# Patient Record
Sex: Male | Born: 1950 | Race: White | Hispanic: No | Marital: Married | Smoking: Current every day smoker
Health system: Southern US, Community
[De-identification: ages and names within clinical notes are randomized; demographics above are authoritative.]

## PROBLEM LIST (undated history)

## (undated) DIAGNOSIS — I509 Heart failure, unspecified: Secondary | ICD-10-CM

## (undated) DIAGNOSIS — R233 Spontaneous ecchymoses: Secondary | ICD-10-CM

## (undated) DIAGNOSIS — I739 Peripheral vascular disease, unspecified: Secondary | ICD-10-CM

## (undated) DIAGNOSIS — M549 Dorsalgia, unspecified: Secondary | ICD-10-CM

## (undated) DIAGNOSIS — I10 Essential (primary) hypertension: Secondary | ICD-10-CM

## (undated) DIAGNOSIS — R6883 Chills (without fever): Secondary | ICD-10-CM

## (undated) DIAGNOSIS — M1990 Unspecified osteoarthritis, unspecified site: Secondary | ICD-10-CM

## (undated) DIAGNOSIS — I2510 Atherosclerotic heart disease of native coronary artery without angina pectoris: Secondary | ICD-10-CM

## (undated) DIAGNOSIS — R238 Other skin changes: Secondary | ICD-10-CM

## (undated) DIAGNOSIS — F419 Anxiety disorder, unspecified: Secondary | ICD-10-CM

## (undated) HISTORY — DX: Chills (without fever): R68.83

## (undated) HISTORY — PX: FRACTURE SURGERY: SHX138

## (undated) HISTORY — PX: KNEE ARTHROSCOPY: SHX127

## (undated) HISTORY — DX: Other skin changes: R23.8

## (undated) HISTORY — DX: Spontaneous ecchymoses: R23.3

## (undated) HISTORY — PX: ORTHOPEDIC SURGERY: SHX850

## (undated) HISTORY — PX: OTHER SURGICAL HISTORY: SHX169

## (undated) HISTORY — DX: Unspecified osteoarthritis, unspecified site: M19.90

## (undated) SURGERY — REPAIR, HERNIA, INGUINAL, ADULT
Anesthesia: General | Laterality: Right

---

## 2000-03-03 ENCOUNTER — Emergency Department (HOSPITAL_COMMUNITY): Admission: EM | Admit: 2000-03-03 | Discharge: 2000-03-03 | Payer: Self-pay | Admitting: Emergency Medicine

## 2000-08-17 ENCOUNTER — Emergency Department (HOSPITAL_COMMUNITY): Admission: EM | Admit: 2000-08-17 | Discharge: 2000-08-17 | Payer: Self-pay | Admitting: *Deleted

## 2000-11-30 ENCOUNTER — Emergency Department (HOSPITAL_COMMUNITY): Admission: EM | Admit: 2000-11-30 | Discharge: 2000-11-30 | Payer: Self-pay | Admitting: Emergency Medicine

## 2001-12-06 ENCOUNTER — Encounter: Payer: Self-pay | Admitting: Emergency Medicine

## 2001-12-06 ENCOUNTER — Emergency Department (HOSPITAL_COMMUNITY): Admission: EM | Admit: 2001-12-06 | Discharge: 2001-12-06 | Payer: Self-pay | Admitting: Emergency Medicine

## 2002-01-24 ENCOUNTER — Emergency Department (HOSPITAL_COMMUNITY): Admission: EM | Admit: 2002-01-24 | Discharge: 2002-01-24 | Payer: Self-pay | Admitting: Emergency Medicine

## 2002-06-29 ENCOUNTER — Ambulatory Visit (HOSPITAL_COMMUNITY): Admission: RE | Admit: 2002-06-29 | Discharge: 2002-06-30 | Payer: Self-pay | Admitting: Cardiovascular Disease

## 2002-06-29 ENCOUNTER — Encounter: Payer: Self-pay | Admitting: Cardiovascular Disease

## 2002-07-16 ENCOUNTER — Encounter: Payer: Self-pay | Admitting: *Deleted

## 2002-07-16 ENCOUNTER — Ambulatory Visit (HOSPITAL_COMMUNITY): Admission: RE | Admit: 2002-07-16 | Discharge: 2002-07-16 | Payer: Self-pay | Admitting: *Deleted

## 2002-09-09 HISTORY — PX: CARDIAC CATHETERIZATION: SHX172

## 2002-09-21 ENCOUNTER — Ambulatory Visit (HOSPITAL_BASED_OUTPATIENT_CLINIC_OR_DEPARTMENT_OTHER): Admission: RE | Admit: 2002-09-21 | Discharge: 2002-09-21 | Payer: Self-pay | Admitting: Orthopedic Surgery

## 2002-10-12 ENCOUNTER — Ambulatory Visit (HOSPITAL_COMMUNITY): Admission: RE | Admit: 2002-10-12 | Discharge: 2002-10-12 | Payer: Self-pay | Admitting: Family Medicine

## 2002-10-12 ENCOUNTER — Encounter: Payer: Self-pay | Admitting: Family Medicine

## 2003-02-27 ENCOUNTER — Emergency Department (HOSPITAL_COMMUNITY): Admission: EM | Admit: 2003-02-27 | Discharge: 2003-02-27 | Payer: Self-pay | Admitting: Emergency Medicine

## 2003-05-18 ENCOUNTER — Inpatient Hospital Stay (HOSPITAL_COMMUNITY): Admission: EM | Admit: 2003-05-18 | Discharge: 2003-05-20 | Payer: Self-pay | Admitting: Emergency Medicine

## 2003-06-21 ENCOUNTER — Ambulatory Visit (HOSPITAL_COMMUNITY): Admission: RE | Admit: 2003-06-21 | Discharge: 2003-06-22 | Payer: Self-pay | Admitting: Cardiovascular Disease

## 2003-09-08 ENCOUNTER — Inpatient Hospital Stay (HOSPITAL_COMMUNITY): Admission: EM | Admit: 2003-09-08 | Discharge: 2003-09-11 | Payer: Self-pay | Admitting: Emergency Medicine

## 2003-10-25 ENCOUNTER — Ambulatory Visit (HOSPITAL_COMMUNITY): Admission: RE | Admit: 2003-10-25 | Discharge: 2003-10-25 | Payer: Self-pay | Admitting: Orthopedic Surgery

## 2003-10-25 ENCOUNTER — Ambulatory Visit (HOSPITAL_BASED_OUTPATIENT_CLINIC_OR_DEPARTMENT_OTHER): Admission: RE | Admit: 2003-10-25 | Discharge: 2003-10-25 | Payer: Self-pay | Admitting: Orthopedic Surgery

## 2004-04-21 ENCOUNTER — Emergency Department (HOSPITAL_COMMUNITY): Admission: EM | Admit: 2004-04-21 | Discharge: 2004-04-21 | Payer: Self-pay | Admitting: *Deleted

## 2004-09-03 ENCOUNTER — Emergency Department (HOSPITAL_COMMUNITY): Admission: EM | Admit: 2004-09-03 | Discharge: 2004-09-03 | Payer: Self-pay | Admitting: Family Medicine

## 2004-09-06 ENCOUNTER — Emergency Department (HOSPITAL_COMMUNITY): Admission: EM | Admit: 2004-09-06 | Discharge: 2004-09-06 | Payer: Self-pay | Admitting: Emergency Medicine

## 2004-09-20 ENCOUNTER — Ambulatory Visit (HOSPITAL_COMMUNITY): Admission: RE | Admit: 2004-09-20 | Discharge: 2004-09-20 | Payer: Self-pay | Admitting: Cardiovascular Disease

## 2005-03-18 ENCOUNTER — Emergency Department (HOSPITAL_COMMUNITY): Admission: EM | Admit: 2005-03-18 | Discharge: 2005-03-18 | Payer: Self-pay | Admitting: Emergency Medicine

## 2005-05-30 ENCOUNTER — Emergency Department (HOSPITAL_COMMUNITY): Admission: EM | Admit: 2005-05-30 | Discharge: 2005-05-30 | Payer: Self-pay | Admitting: Emergency Medicine

## 2005-09-04 ENCOUNTER — Emergency Department (HOSPITAL_COMMUNITY): Admission: EM | Admit: 2005-09-04 | Discharge: 2005-09-04 | Payer: Self-pay | Admitting: Emergency Medicine

## 2006-04-20 ENCOUNTER — Emergency Department (HOSPITAL_COMMUNITY): Admission: EM | Admit: 2006-04-20 | Discharge: 2006-04-20 | Payer: Self-pay | Admitting: Family Medicine

## 2006-08-30 ENCOUNTER — Emergency Department (HOSPITAL_COMMUNITY): Admission: EM | Admit: 2006-08-30 | Discharge: 2006-08-30 | Payer: Self-pay | Admitting: Emergency Medicine

## 2007-09-18 ENCOUNTER — Emergency Department (HOSPITAL_COMMUNITY): Admission: EM | Admit: 2007-09-18 | Discharge: 2007-09-18 | Payer: Self-pay | Admitting: Family Medicine

## 2008-08-26 ENCOUNTER — Encounter: Admission: RE | Admit: 2008-08-26 | Discharge: 2008-08-26 | Payer: Self-pay | Admitting: Sports Medicine

## 2008-09-21 ENCOUNTER — Encounter: Admission: RE | Admit: 2008-09-21 | Discharge: 2008-09-21 | Payer: Self-pay | Admitting: Sports Medicine

## 2011-01-25 NOTE — Op Note (Signed)
NAME:  Cole Fuller, Cole Fuller                         ACCOUNT NO.:  [id number]   MEDICAL RECORD NO.:  [id number]                   PATIENT TYPE:  AMB   LOCATION:  DSC                                  FACILITY:  [facility]   PHYSICIAN:  Robert A. Thurston Hole, M.D.              DATE OF BIRTH:  1950/12/01   DATE OF PROCEDURE:  09/21/2002  DATE OF DISCHARGE:                                 OPERATIVE REPORT   PREOPERATIVE DIAGNOSIS:  Right knee medial meniscus tear.   POSTOPERATIVE DIAGNOSIS:  Right knee medial meniscus tear with synovitis.   OPERATION:  1. Right knee examination under anesthesia followed by arthroscopic partial     medial meniscectomy.  2. Right knee partial synovectomy.   SURGEON:  Elana Alm. Thurston Hole, M.D.   ASSISTANT:  Julien Girt, P.A.   ANESTHESIA:  Local with MAC.   OPERATIVE TIME:  30 minutes.   COMPLICATIONS:  None.   INDICATIONS FOR PROCEDURE:  The patient is a 60 year old gentleman who has  had significant right knee pain for the past five to six months, increasing  in nature with signs and symptoms and MRI documenting a medial meniscus tear  who has failed conservative care and is now to undergo arthroscopy.   DESCRIPTION OF PROCEDURE:  The patient was brought to the operating room on  09/21/2002 after a knee block had been placed in the holding area.  He was  placed on the operating room table in a supine position.  His right knee was  examined under anesthesia.  He had range of motion from 0 to 130 degrees, 1  to 2+ crepitation, stable ligamentous exam with normal patellar tracking.  The right leg was prepped using sterile Duraprep and draped using sterile  technique.  Originally through an inferolateral portal, arthroscope with a  pump attached was placed.  Through an inferomedial portal, an arthroscopic  probe was placed.  On initial inspection of the medical compartment, the  articular cartilage was normal.  Medial meniscus was probed, and he had a  tear  of the posterior and medial horn of which 40 to 50% was resected back  to a stable rim.  ACL/PCL was normal.  Lateral compartment articular  cartilage was normal, lateral meniscus normal.  Patellofemoral joint and  articular cartilage were normal.  The patella tracked normally.  Moderate  synovitis in medial and lateral gutters were debrided.  Otherwise, this was  free of pathology.  After this was done, it was felt that all pathology had  been satisfactorily addressed.  The instruments were removed.  Portal was  closed with 3-0 nylon suture and injected with 0.25% Marcaine with  epinephrine and 4 mg of morphine.  Sterile dressing was applied.  The  patient was awakened and taken to the recovery room in stable condition.   FOLLOWUP CARE:  The patient will be followed as an outpatient on Percocet  and Naprosyn and  see me back in my office in a week for sutures out and  followup.                                                Robert A. Thurston Hole, M.D.    RAW/MEDQ  D:  09/21/2002  T:  09/21/2002  Job:  [id number]

## 2011-01-25 NOTE — Discharge Summary (Signed)
NAME:  Cole Fuller, Cole Fuller                         ACCOUNT NO.:  [id number]   MEDICAL RECORD NO.:  [id number]                   PATIENT TYPE:  OIB   LOCATION:  2034                                 FACILITY:  [facility]   PHYSICIAN:  Nanetta Batty, M.D.                DATE OF BIRTH:  01-06-1951   DATE OF ADMISSION:  06/21/2003  DATE OF DISCHARGE:  06/22/2003                                 DISCHARGE SUMMARY   DISCHARGE DIAGNOSES:  1. Claudication, right superficial femoral artery stenting this admission.  2. Known coronary artery disease with circumflex intervention, May 19, 2003, and known total right coronary artery with normal left ventricular     function.  3. Treated hypertension.  4. Treated hyperlipidemia.  5. History of smoking.  6. Macrocytic indices of 105 of unclear etiology.   HOSPITAL COURSE:  The patient is a 60 year old male known to Dr. Allyson Fuller who  had coronary artery disease and intervention on the circumflex on May 19, 2003. He is followed by Dr. Jenne Fuller.  His original circumflex stents were  placed in October 2003. He was seen by Dr. Allyson Fuller for evaluation of  claudication.  Lower extremity dopplers were obtained as an outpatient which  indicated severe insufficiency on the right with an ABI of 0.55.  He was  admitted for peripheral angiogram which was done June 21, 2003, by  Dr. Allyson Fuller. This revealed 50% left SFA with three vessel runoff and 99% right  SFA.  He underwent stenting to the right SFA with good final results.  Post  procedure he did have some right thigh pain, although this improved by the  next day. He has ambulated without problems.  On his preadmission labs he  had an MCV of 105.1 and a white count of 11.5. This was repeated. His white  count was 13.2, and hemoglobin 13.1 at discharge.  His renal function was  normal with a sodium of 142, potassium 3.6, BUN 6, creatinine 0.8. B12 and  folate levels were obtained. His B-12 was normal and  folate pending as well  as a differential. He will follow this up with Dr. Sherwood Fuller.   DISCHARGE MEDICATIONS:  1. Aspirin 81 mg.  2. Xanax as taken at home.  3. Lexapro 5 mg a day.  4. Plavix 75 mg a day.  5. Zocor 40 mg a day.  6. Lopressor 25 mg b.i.d.  7. Altace 2.5 mg a day.   He has been instructed to take folic acid and B12 as an outpatient.   LABORATORY DATA:  As noted above.    DISCHARGE INSTRUCTIONS:  The patient is discharged in stable condition and  will have lower extremity arterial dopplers as an outpatient.  He has been  instructed to contact Dr. Sherwood Fuller for further evaluation of his elevated white  count and MCV.      Abelino Derrick, P.A.  Nanetta Batty, M.D.    [name]  D:  06/22/2003  T:  06/22/2003  Job:  [id number]   cc:   Nanetta Batty, M.D.  [address], [address]  [place]  [place] [address]  Fax: [phone]   Cole Fuller, M.D.  [address]  [place]  [place] [address]  Fax: [phone]
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
## 2011-01-25 NOTE — Cardiovascular Report (Signed)
NAME:  Cole Fuller, Cole Fuller NO.:  [id number]   MEDICAL RECORD NO.:  [id number]                   PATIENT TYPE:  OIB   LOCATION:  2899                                 FACILITY:  [facility]   PHYSICIAN:  Nanetta Batty, M.D.                DATE OF BIRTH:  1951/03/26   DATE OF PROCEDURE:  06/21/2003  DATE OF DISCHARGE:                              CARDIAC CATHETERIZATION   INDICATIONS:  Cole Fuller is a 60 year old white male with history of CAD  status post circumflex PCI for in-stent restenosis May 19, 2003.  He  has had claudication with abnormal Dopplers.  His right ABI was 0.5 and his  left was 1.  He presents now for angiography, potential intervention.   PROCEDURE DESCRIPTION:  The patient was brought to the sixth floor [facility] in the postabsorptive state.  He  was premedicated with p.o. Valium and IV Versed as well as Nubain.  His left  groin was prepped and shaved in the usual sterile fashion.  1% Xylocaine was  used for local anesthesia.  A 5-French sheath was inserted into the left  femoral artery using standard Seldinger technique.  5-French tennis racquet  catheter and IMA catheters were used for mid stream and distal abdominal  aortography as well as bifemoral runoff.  Contralateral access was obtained  with the MA catheter and a 0.035 Wholey wire.  Selective angiography was  obtained through a short 5-French end hole catheter of the SFA profunda  bifurcation as well as a digital subtraction peak holed shot of the  trifurcation at the knee.  ICD dye was used for the entirety of the case.  Retrograde aortic pressure was monitored during the case.   ANGIOGRAPHIC RESULTS:  1. Abdominal aorta:     a. Renal arteries:  Normal.     b. Infrarenal abdominal aorta:  Normal.  2. Left lower extremity:     a. 50% segmental mid left SFA with three vessel runoff.  3. Right lower extremity:     a. Long 99%  segmental proximal and mid right SFA stenosis with        reconstitution at Hunter's canal with three vessel runoff.   PROCEDURE DESCRIPTION:  The patient received a total of 4500 units of  heparin intravenously.  Contralateral access was obtained with a 7-French  Terumo crossover sheath.  A 4 x 6 Powerflex was used for backup and a Wholey  was attempted to cross the lesion unsuccessfully.  Following this, an 0.035  angled Glidewire was used to cross the lesion into the distal SFA.  The  balloon was unable to cross the lesion, however, and because of this an  0.014 Choice PT wire was then used to cross the lesion into the distal SFA  and a 5 x 2 Aviator balloon was used to pre dilate the proximal segment of  the  SFA.  Following this, the 0.035 4 x 6 balloon was then advanced over the  0.014 Choice PT wire into the distal SFA and was exchanged for an 0.035  Wholey wire.  Overlapping inflations were performed in the mid and proximal  SFA with a 4 x 6 and a 5 x 10 Powerflex.  Following this overlapping Smart  stents (6 x 10, 6 x 10, 6 x 8) were then deployed in overlapping fashion and  post dilated with a 6 x 8 Opti-balloon at 1-2 atmospheres.  200 mcg of  intraarterial nitroglycerin was then administered down the sheath.  The  final angiographic result was reduction of a long segmental mid and proximal  right SFA stenosis with 0% residual.  The ostium of the SFA was protected  and the patient retained three vessel runoff.   OVERALL IMPRESSION:  Successful right superficial femoral artery PTA and  stenting of a long segmental proximal mid subtotal lesion with overlapping 6  mm Smart stents resulting in excellent angiographic result with brisk flow  and preservation of distal runoff.   The patient tolerated the procedure well.  ACT was measured at 214.  The  sheaths will be pulled in the laboratory and pressure will be held on the  groin to achieve hemostasis.  The patient left the  laboratory in stable  condition.  Plans will be to proceed with __________ and Plavix.  He will  remain recumbent for six hours and will be hydrated overnight.  Will be  discharged home in the morning and will get follow-up Dopplers and ABIs.  Will see him back in the office in approximately two weeks.                                                Nanetta Batty, M.D.    JB/MEDQ  D:  06/21/2003  T:  06/21/2003  Job:  [id number]   cc:   [facility]   [facility]   Madelin Rear. Sherwood Gambler, M.D.  [address]  [place]  [place] [address]  Fax: [phone]

## 2011-01-25 NOTE — Discharge Summary (Signed)
NAME:  Cole Fuller, Cole Fuller                         ACCOUNT NO.:  [id number]   MEDICAL RECORD NO.:  [id number]                   PATIENT TYPE:  INP   LOCATION:  6526                                 FACILITY:  [facility]   PHYSICIAN:  Cristy Hilts. Jacinto Halim, M.D.                  DATE OF BIRTH:  12-Sep-1962   DATE OF ADMISSION:  05/18/2003  DATE OF DISCHARGE:  05/20/2003                                 DISCHARGE SUMMARY   DISCHARGE DIAGNOSES:  1. Unstable angina, negative myocardial infarction.  2. Coronary disease with cutting balloon percutaneous coronary intervention     of the proximal mid circumflex stent and ostium of the marginal branch.  3. Hypertension, controlled.  4. Hyperlipidemia.  5. Tobacco use.  6. Lower extremity claudication, chronic.  7. Depression.   DISCHARGE CONDITION:  Improved.   PROCEDURES:  Combined left heart catheterization on May 19, 2003 by Dr.  Nanetta Batty with cutting balloon angioplasty of in-stent restenosis of  the circumflex and ostium of the marginal branch using injunctive  glycoprotein IIb/IIIa inhibition.   DISCHARGE MEDICATIONS:  1. Plavix 75 mg one daily.  2. Aspirin enteric-coated 81 mg daily.  3. Zocor 40 mg every evening.  4. Pletal 50 mg twice a day.  5. Lexapro 5 mg daily which is one-half of a 10 mg tablet.  6. Lopressor 25 one-half of a 50 mg tablet twice a day.  7. Altace 2.5 daily.  8. Xanax 2 mg as before.  9. Oxycodone as before.  10.      Nitroglycerin 1/150 as needed under your tongue for chest pain.  11.      Nicoderm patch.   DISCHARGE INSTRUCTIONS:  1. Do not smoke with patch on - could cause a heart attack.  2. No strenuous activity, no sexual activity, no lifting over 10 pounds for     three days, then resume regular activities.  3. Low fat/low salt diet.  4. Wash right groin catheterization site with soap and water.  Call for any     bleeding, swelling, or drainage.  5. See Dr. Allyson Sabal for follow-up June 02, 2003 at  2:30 p.m.   HISTORY OF PRESENT ILLNESS:  A 60 year old white married male with history  of coronary disease, peripheral vascular disease, tobacco use, and  hyperlipidemia presented through the emergency room at [facility] with chest pain  and leg pain   The patient had been to Dr. Sharyon Medicus office the morning of admission -  May 18, 2003 - because his medication had run out and increasing lower  extremity pain.  He also had mentioned some chest pain while he was there.  The pain was described as left chest pain into left breast, dull pain, comes  and goes intermittently, and it increased with anxiety/panic per the  patient; no change with exertion.  The night before it felt like his  heartbeat was slow and  would then beat hard.  His previous angina was  somewhat difference than this in that it was pain in his throat and he was  diaphoretic.   He was admitted to [facility] by Dr. Jacinto Halim on call for Dr. Allyson Sabal with  plans for cardiac catheterization.  He was put on IV heparin and tobacco  cessation nurse was asked to see the patient.  Fortunately, the patient did  well overnight.  By May 19, 2003 he had no chest pain.  He did undergo  his cardiac catheterization with results as stated.   By May 20, 2003 he was stable and ready for discharge home.  He would  continue aspirin and Plavix for at least two weeks and follow up with Dr.  Allyson Sabal.   HOSPITAL COURSE:  See above.   PHYSICAL EXAMINATION AT DISCHARGE:  VITAL SIGNS:  Blood pressure 130/70,  pulse 80, respirations 18, temperature 97.1, room air oxygen saturation 96%.  GENERAL:  Alert, oriented male, no chest pain.  Right groin catheterization  site stable.  No hematoma.  CARDIOVASCULAR:  Heart sounds S1, S2, regular rate and rhythm.   LABORATORY DATA:  Hemoglobin 14.7, hematocrit 43.4, WBC 10.5, MCV 102,  platelets 253, neutrophils 54, lymphs 41, monos 4, eos 0, basos 1; these  remained stable.  Protime 12.7, INR 0.9, PTT  34, and heparin levels ranged  0.75 to 0.23 on heparin.   Chemistry:  Sodium 141, potassium 3.9, chloride 109, CO2 25, glucose 106,  BUN 10, creatinine 0.8, calcium 10.0.  Potassium did drop to 3.3 and it was  repleted.   Cardiac enzymes:  CKs 138, 122, 118, 89.  MBs 1.7, 1.6, 1.6, 1.1.  Troponin  I less than 0.01 x3.   Total cholesterol 169, triglycerides 174, HDL 39, LDL 95.   EKG:  Sinus rhythm, normal EKG.  No significant change from previous  tracings.  Chest x-ray done but results are not in the chart.    CARDIAC CATHETERIZATION:  See dictated report.   The patient was seen and discharged by Dr. Allyson Sabal on May 20, 2003 and  would follow up as an outpatient.      Darcella Gasman. [place], N.P.                     Cristy Hilts. Jacinto Halim, M.D.    LRI/MEDQ  D:  06/13/2003  T:  06/14/2003  Job:  [id number]   cc:   Nanetta Batty, M.D.  [address]  [place]  [place] [address]  Fax: [phone]   Madelin Rear. Sherwood Gambler, M.D.  [address]  [place]  [place] [address]  Fax: [phone]

## 2011-01-25 NOTE — Cardiovascular Report (Signed)
NAME:  Cole Fuller, Cole Fuller                         ACCOUNT NO.:  [id number]   MEDICAL RECORD NO.:  [id number]                   PATIENT TYPE:  INP   LOCATION:  6526                                 FACILITY:  [facility]   PHYSICIAN:  Nanetta Batty, M.D.                DATE OF BIRTH:  13-Jun-1951   DATE OF PROCEDURE:  DATE OF DISCHARGE:                              CARDIAC CATHETERIZATION   PROCEDURE:  Selective right and left cardiac catheterization, left  ventriculography, abdominal aortography and PCI and stenting.   INDICATIONS:  Cole Fuller is a 60 year old married white male with a history  of CAD status post circumflex PCI and stenting by Darlin Priestly, M.D.  June 29, 2002.  He had sequential stents placed in the proximal and mid  circumflex straddling a large first obtuse marginal branch.  He had a small  RCA which did give off a PDA and had 95% lesion, but was only a 1.5 mm  vessel.  Normal LV function.  He was admitted September 8 with unstable  angina, ruled out for myocardial infarction.  Placed on IV heparin and  nitroglycerin.  He presents now for diagnostic coronary arteriography.   PROCEDURE DESCRIPTION:  The patient was brought to the second floor [facility] in the postabsorptive state.  He was  premedicated with p.o. Valium.  His right groin was prepped and shaved in  the usual sterile fashion.  1% Xylocaine was used for local anesthesia.  A 6-  French sheath was inserted into the right femoral artery using standard  Seldinger technique.  6-French right and left Judkins diagnostic catheters  along with a 6-French pigtail catheter were used for selective coronary  angiography, left ventriculography, and distal abdominal aortography.  Omnipaque dye was used for the entirety of the case.  Retrograde aortic,  left ventricular, and pullback pressures were recorded.   HEMODYNAMICS:  1. Aortic systolic pressure 153, diastolic pressure  74.  2. Left ventricular systolic pressure 154 and diastolic pressure 16.   SELECTIVE CORONARY ANGIOGRAPHY:  1. Left main:  Normal.  2. LAD:  Normal.  3. Left circumflex:  This was a codominant vessel with stent in the proximal     and mid portion.  There was 90% in-stent restenosis within the proximal     stent and 50-70% within the mid stent.  The ostium of the marginal branch     had mild to moderate disease.  4. Right coronary artery:  This was a small vessel with 95% lesion that was     unchanged.   LEFT VENTRICULOGRAPHY:  RAO left ventriculogram was performed using 25 mL of  Omnipaque dye at 12 mL/second.  The overall LVEF was estimated greater than  60% without focal wall motion abnormalities.   DISTAL ABDOMINAL AORTOGRAPHY:  Performed using 20 mL of Omnipaque dye at 20  mL/second.  The renal arteries were widely  patent.  The infrarenal abdominal  aorta and iliac bifurcation appear free of significant atherosclerotic  changes.   IMPRESSION:  Cole Fuller has high grade in-stent restenosis within his tandem  circumflex stents.  Will proceed with PCI using cutting balloon angioplasty  and adjunctive Integrilin.   PROCEDURE DESCRIPTION:  An existing 6-French sheath in right femoral artery  was exchanged over a wire for a 7-French sheath.  6-French sheath was then  inserted into the right femoral vein.  Using a 7-French left 3.5 Voda guide  catheter along with an 0.014 190 Sport guidewire and a 3.0 x 10 mm cutting  balloon PCI was performed.  The patient received 3000 units of heparin with  an ACT at the end of the case at 234.  He was on aspirin and received Plavix  300 mg p.o. as well as Integrilin double bolus and infusion.  Angioplasty  was performed on the mid and proximal stent using the cutting balloon at 6-8  atmospheres.  This resulted in reduction of a 70% in-stent restenosis within  the mid stent to less than 20% and 90% in-stent restenosis within the  proximal stent  to 0%.  There was some snow plowing within the ostium of  the marginal branch seen on multiple angiographic views after administration  of intracoronary nitroglycerin.  This marginal branch was then wired with  the Sport guidewire and the 3.0 x 10 cutting balloon was then used to  angioplasty the ostium of the marginal branch resulting in reduction of 90%  ostial stenosis to 30% residual.  The patient tolerated procedure well  without electrocardiographic or hemodynamic sequelae.   OVERALL IMPRESSION:  Successful cutting balloon PCI of the proximal mid  circumflex stent and ostium of the marginal branch using adjunctive  glycoprotein 2B3A inhibition.  The guidewire and catheters were removed.  The sheaths were sewn securely in place.  The patient left laboratory in  stable condition.  Plan is to remove the sheaths once the ACT falls below  150.  Integrilin will be continued for 18 hours.  The patient will be  discharged home in the morning on aspirin and Plavix if he remains  clinically stable overnight.  He left the laboratory in stable condition.                                               Nanetta Batty, M.D.    JB/MEDQ  D:  05/19/2003  T:  05/20/2003  Job:  [id number]   cc:   Cath Lab   [facility]   Madelin Rear. Sherwood Gambler, M.D.  [address]  [place]  [place] [address]  Fax: [phone]

## 2011-01-25 NOTE — Cardiovascular Report (Signed)
NAME:  Cole Fuller, Cole Fuller                         ACCOUNT NO.:  [id number]   MEDICAL RECORD NO.:  [id number]                   PATIENT TYPE:  OIB   LOCATION:  2899                                 FACILITY:  [facility]   PHYSICIAN:  Darlin Priestly, M.D.             DATE OF BIRTH:  November 18, 1950   DATE OF PROCEDURE:  06/29/2002  DATE OF DISCHARGE:                              CARDIAC CATHETERIZATION   PROCEDURES:  1. Left heart catheterization.  2. Coronary angiography.  3. Left ventriculogram.  4. Left circumflex - mid.     a. Percutaneous transluminal coronary balloon angioplasty.     b. Placement of intracoronary stent x2.     c. Intravascular ultrasound imaging.   COMPLICATIONS:  None.   INDICATIONS:  The patient is a 60 year old male, patient of Dr. Artis Delay  with a history of chronic tobacco use, who recently has complained of  exertional chest pain migrating to his throat. He is now referred for  cardiac catheterization.   DESCRIPTION OF PROCEDURE:  After given informed written consent, the patient  was brought to the cardiac catheterization lab where his right and left  groins were shaved, prepped, and draped in the usual sterile fashion.  ECG  monitoring was established.  Using modified Seldinger technique, a #6 French  arterial sheath was inserted in the right femoral artery.  The 6 French  diagnostic catheters were used to perform diagnostic angiography.  This reveals a medium sized left main with no significant disease.   The LAD is a medium sized vessel which coursed to the apex and gave rise to  one diagonal branch.  There was diffuse calcification throughout the  proximal RCA.  There is a 40% proximal LAD disease with 30 mid LAD disease.  The first diagonal is a medium sized vessel with no significant disease.   Left circumflex is a large vessel which coursed in the AV groove and gave  rise to three obtuse marginal branches.  The AV groove circumflex is noted  to  have 80% hazy lesion just prior to the takeoff of the second obtuse  marginal.  There is a 60-70% lesion just beyond the takeoff of the second  obtuse marginal.  The remainder of the circumflex has no significant  disease.  The first OM is a small vessel with no significant disease.  The  second OM is a large vessel with a 50% ostial lesion. The third OM is  codominant and has no significant disease.   The right coronary artery is a medium sized vessel which is codominant and  gives rise to a PDA.  There is diffuse 60-70% disease throughout the  proximal RCA with 80-90% mid RCA lesion.   LEFT VENTRICULOGRAM:  Left ventriculogram reveals mildly depression EF of  50% with mild anterolateral hypokinesis.   HEMODYNAMICS:  Systemic arterial pressure 134/79, LV systemic pressure  134/______, LVEDP of 10.   INTERVENTIONAL  PROCEDURE:  Left circumflex - mid:  Following diagnostic  angiography, a #6 French sheath was then exchanged for a #7 [place] arterial  sheath and a #7 [place] Voda 3.5 guiding catheter with side holes was then  coaxially engaged in the left coronary ostium.  Next, a 0.014 Patriot guide  wire was advanced out of the guiding catheter and positioned in the distal  third OM without difficulty.  We then attempted to primary stent the  proximal AV groove circumflex lesion.  However, once the stent had crossed  the lesion we were unable to visualize the distal circumflex and/or place  the stent.  This stent was ultimately removed and a CrossSail 2.75 x 18 mm  balloon was then tracked across the mid circumflex lesion. We then dilated  the distal of the two lesions to a maximum of 6 atmospheres for a total of 1  minute.  This balloon was then pulled back across the proximal of the two  lesions and one additional inflation to 6 atmospheres performed for 61  seconds.  Followup angiogram revealed mild haziness with TIMI-3 flow.  This  balloon was then removed and a Bx Velocity Hepacoat  stent 2.75 x 8 mm stent  was then tracked across the midportion of the circumflex and positioned  across of the distal two lesions with careful attention not to extend across  the second obtuse marginal.  This stent was then deployed to a maximum of 14  atmospheres for a total of 1 minute.  Stent balloon was then removed and a  second Medtronic S7 3.0 x 12 mm stent was then positioned across the more  proximal two lesions.  This stent was then deployed to a maximum of 14  atmospheres for a total of 1 minute. Followup angiogram revealed no evidence  of thrombosis.  However, there as a question of a stepdown from the first  stent and a stepup into the second stent. At this point, we elected to IVUS  the AV groove circumflex and the Scimed 40 MHz IVUS imaging catheter was  then easily passed into the mid AV groove circumflex.  Mechanical pullback  was then performed. This revealed a 3.0 x 3.0 vessel reference with diffuse  but noncritical disease beyond the second stent.  Pullback throughout the  distal and proximal stent revealed these both to be opposed with no evidence  of dissection.  There appeared to be a good size lumen at the takeoff of the  second obtuse marginal.  This catheter was then removed. IV Angiomax was  given throughout the case.   Final orthogonal angiograms revealed less than 10% residual stenosis in the  AV groove circumflex with TIMI-3 flow to the distal vessel. At this point,  we elected to conclude the procedure.  All balloons, wires, and catheters  were removed.  Hemostatic sheaths were sewn in place.  The patient was  transferred back to the ward in stable condition.   CONCLUSION:  1. Successful percutaneous transluminal coronary balloon angioplasty with     placement of two intracoronary stents (Bx Velocity Hepacoat stent 2.75 x     8, Medtronic S7 3.0 x 12) in the distal and proximal mid circumflex     stenotic lesions respectively. 2. Intravascular ultrasound  imaging of the arteriovenous groove circumflex.  3. Low-normal ejection fraction with wall motion abnormalities noted above.  4. Diffuse disease of the right coronary artery.  5. Adjunct use of Angiomax.  Darlin Priestly, M.D.    RHM/MEDQ  D:  06/29/2002  T:  06/29/2002  Job:  [id number]   cc:   Madelin Rear. Sherwood Gambler, M.D.  [address]  [place]  [place] [address]  Fax: [phone]

## 2011-01-25 NOTE — Cardiovascular Report (Signed)
Cole Fuller, Cole Fuller NO.:  [id number]   MEDICAL RECORD NO.:  [id number]          PATIENT TYPE:  OIB   LOCATION:  2899                         FACILITY:  [facility]   PHYSICIAN:  Nanetta Batty, M.D.   DATE OF BIRTH:  01-31-1951   DATE OF PROCEDURE:  09/20/2004  DATE OF DISCHARGE:                              CARDIAC CATHETERIZATION   HISTORY:  Cole Fuller is a 60 year old gentleman with a history of  hypertension and hyperlipidemia.  He has known CAD status post PCI of the  circumflex.  He also has a known chronic and stable 95% distal small  codominant RCA stenosis.  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  He has a plate in his left foot which will require removal.  He  had a coronary stress test performed September 11, 2004, which showed mild  inferior ischemia consistent with his known anatomy without evidence of  ischemia in the circumflex or LAD distribution.  He gets occasional very  brief chest pain.  The patient presents now for diagnostic coronary  arteriography prior to his upcoming surgery to risk stratify him.   DESCRIPTION OF PROCEDURE:  The patient is brought to the second floor [facility] in a postabsorptive state.  He was premedicated  with p.o. Valium.  His right groin was prepped and draped in the usual  sterile fashion.  1% Xylocaine was used for local anesthesia.  A 6 French  sheath was inserted into the right femoral artery using standard Seldinger  technique.  6 French right and left Judkins diagnostic catheters as well as  a 6 French pigtail catheter and 5 French catheter were used for selective  coronary angiography and left ventriculography respectively.  Visipaque dye  was used for the entirety of the case.  Retrograde aorta and left  ventricular pressures were recorded.   HEMODYNAMICS:  See cath lab flow.   SELECTIVE CORONARY ANGIOGRAPHY:  1.  Left main normal.  2.  LAD:  The LAD had, at most, 40%  segmental proximal stenosis.  3.  Left circumflex:  The two previously placed stents were widely patent.      The OM branch which arose between the two stents was widely patent, as      well.  There were minor irregularities in the distal circumflex.  4.  Right coronary artery:  Small codominant vessel with a stable 90% distal      stenosis just proximal to the genu of the vessel.  5.  Left ventriculography:  Left ventriculogram was performed using 25 mL of      Visipaque dye at 12 mL per second.  The overall LVEF is estimated at      greater than 60% without focal wall motion abnormalities.   IMPRESSION:  Cole Fuller has stable CAD with no evidence of restenosis within  his circumflex stents.  His right coronary artery stenosis has remained  stable and is probably responsible for his inferior ischemic abnormality and  Cardiolite and his stable chest pain.  I believe he is at low  risk to  undergo his upcoming surgical procedure tomorrow.  If his pain becomes more  frequent or severe, he is a candidate for PCI of his RCA.   The sheath was removed and pressure was held on the groin to achieve  hemostasis.  The patient left the lab in stable condition.  He will be  discharged home as an outpatient.      JB/MEDQ  D:  09/20/2004  T:  09/20/2004  Job:  [id number]   cc:   [facility]  [address]   Juluis Mire, M.D.  [address]  [place]  [place] [address]  Fax: [phone]   Leonides Grills, M.D.  [address]  [place]  [place] [address]  Fax: [phone]

## 2011-01-25 NOTE — Discharge Summary (Signed)
NAME:  Cole Fuller, Cole Fuller                         ACCOUNT NO.:  [id number]   MEDICAL RECORD NO.:  [id number]                   PATIENT TYPE:  INP   LOCATION:  5036                                 FACILITY:  [facility]   PHYSICIAN:  Ollen Gross, M.D.                 DATE OF BIRTH:  1950/12/26   DATE OF ADMISSION:  09/08/2003  DATE OF DISCHARGE:  09/11/2003                                 DISCHARGE SUMMARY   ADMISSION DIAGNOSES:  1. Left ankle fracture dislocation with midfoot dislocation.  2. Coronary arterial disease.  3. Hypertension.  4. Hypercholesterolemia.  5. Status post cardiac catheterization with coronary stenting.   DISCHARGE DIAGNOSES:  1. Left bimalleolar ankle fracture, status post open reduction internal     fixation, left bimalleolar ankle fracture dislocation.  2. Compartment syndrome, left foot, status post fasciotomy of left foot.  3. Medial cuneiform fracture.  4. First, second, third, and fourth tarsal metatarsal Lisfranc dislocation,     status post open reduction internal fixation of Lisfranc joints.   PROCEDURE:  The patient was taken to the OR on September 08, 2003, and  underwent ORIF of the left ankle fracture by Dr. Ollen Gross.  Also  underwent a fasciotomy of the left foot and an ORIF of the Lisfranc  fractures performed by Dr. Leonides Grills.   CONSULTATIONS:  None.   HISTORY:  The patient is a 60 year old male with severe left foot pain who  was run over by a tractor trailer, sustained ankle midfoot dislocations,  also ankle fracture dislocation.  The patient is subsequently admitted to  the hospital for surgical intervention.   LABORATORY DATA:  CBC on admission showed a hemoglobin of 13.3, hematocrit  of 39, white cell count of 11.9, red cell count 3.92, differential within  normal limits.  Followup CBC showed a hemoglobin of 10.9, hematocrit 31.9,  white count 13.2, red blood cell count 3.2.  PT and PTT on admission were  12.3 and 32, respectively,  with an INR of 0.9.  Chem panel on admission  showed a slightly elevated glucose of 114, otherwise negative.  I do not see  a chest x-ray or EKG on this chart.   HOSPITAL COURSE:  Mr. Cindric is a 60 year old male who was admitted for  injuries sustained to the left foot.  He was admitted by Dr. Ollen Gross,  taken to the OR, and underwent the above stated procedure.  Due to the  Lisfranc fractures, Dr. Lequita Halt called in his partner, Dr. Leonides Grills, and  he underwent above procedures per Dr. Despina Hick and Dr. Lestine Box.  Tolerated the  procedures well.  Later transferred to the recovery room and then to the  orthopaedic floor for continued postoperative care.  Vital signs were  followed.  Started on PCA analgesia for pain control following surgery.  Nicotine patch.  Given 48 hours of postoperative IV antibiotics in the form  of  Ancef.  Started on aspirin.  Started back on his home medications.  By  day #1, the patient still had a fair amount of moderate pain following  surgery, utilizing p.o. and IV analgesics.  Encouraged elevation due to the  significant injury.  By day #2, the patient was doing much better.  Pain was  under better control.  He was placed non-weightbearing.  Physical therapy  was consulted for an evaluation.  He was placed into a splint at the time of  surgery.  Recommended continuing elevation.  By day #3, September 11, 2003, the  patient is doing much better, had been weaned over to p.o. medications.  He  was getting up non-weightbearing as per physical therapy.  The patient was  tolerating his medications, ambulating short distances with a standard  walker, doing well, and it was decided the patient could be discharged home  at that time.   DISCHARGE PLAN:  The patient is discharged home on September 11, 2003.   DISCHARGE DIAGNOSES:  Please see above.   DISCHARGE MEDICATIONS:  1. Mepergan Fortis.  2. Robaxin.   ACTIVITY:  Non-weightbearing to the left lower extremity,  keep ankle and  foot elevated above the level of his heart when he is not up ambulating.   WOUND CARE:  Keep the wound, dressing, and splint clean and dry.   DIET:  As tolerated.  Cardiac diet.   DISPOSITION:  Home.   FOLLOWUP:  This following week with Dr. Lestine Box.   CONDITION ON DISCHARGE:  Improved.      Cole Fuller, P.A.              Ollen Gross, M.D.    ALP/MEDQ  D:  11/08/2003  T:  11/09/2003  Job:  [id number]

## 2011-01-25 NOTE — Op Note (Signed)
NAME:  Cole Fuller, Cole Fuller                         ACCOUNT NO.:  [id number]   MEDICAL RECORD NO.:  [id number]                   PATIENT TYPE:  AMB   LOCATION:  DSC                                  FACILITY:  [facility]   PHYSICIAN:  Leonides Grills, M.D.                  DATE OF BIRTH:  [birth year]   DATE OF PROCEDURE:  10/25/2003  DATE OF DISCHARGE:                                 OPERATIVE REPORT   PREOPERATIVE DIAGNOSIS:  Complication of hardware, left foot.   POSTOPERATIVE DIAGNOSIS:  Complication of hardware, left foot.   OPERATION:  1. Hardware removal deep, left foot.  2. Stress x-ray of the left foot.   ANESTHESIA:  General endotracheal tube anesthesia.   SURGEON:  Leonides Grills, M.D.   ASSISTANT:  Lianne Cure, P.A.-C.   ESTIMATED BLOOD LOSS:  Minimal.   TOURNIQUET TIME:  None.   COMPLICATIONS:  None, disposition to the APR.   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 He was consented for  the  above procedure. All risks which included infection, neurovascular injury,  persistent pain, arthritis, stiffness and possible fusion were all explained  and questions were encouraged and answered.   DESCRIPTION OF PROCEDURE:  The patient was brought to the operating room and  placed in the supine position after adequate general endotracheal tube  anesthesia was administered as well as Ancef 1 gm IV piggyback. The left  lower extremity was then prepped and draped in a sterile manner. No thigh  tourniquet was used.   We then by palpation made a longitudinal incision over the  prominence of  the K-wire of the 5th tarsometatarsal. A dissection was carried down to  bone. The K-wire was  removed with a needle-nose pliers. The 4th  tarsometatarsal joint K-wire actually propagated and  migrated proximally.  This was not able to be retrieved under C-arm guidance.   At this point we made a longitudinal incision approximately 3 cm down to  bone. The K-wire was identified and was flush with the bone. A small  piece  of bone was removed dorsally and the K-wire was then removed without  difficulty. We finally under C-arm guidance identified  the tip of the 3rd  tarsometatarsal joint K-wire. Dissection was carried down to the K-wire. The  K-wire was removed with needle-nose pliers without difficulty. Stress x-rays  were obtained and showed stable congruent alignment of the 3rd, 4th and 5th  tarsometatarsal joints.   The wound was copiously irrigated with normal saline. The wound was closed  with 4-0 nylon suture over all  wounds. A sterile dressing was applied. A Cam-  Walker boot was applied. The patient was stable to the PR.                                               Leonides Grills, M.D.    PB/MEDQ  D:  10/25/2003  T:  10/25/2003  Job:  [id number]

## 2011-08-27 ENCOUNTER — Encounter: Payer: Self-pay | Admitting: *Deleted

## 2011-08-27 ENCOUNTER — Emergency Department (HOSPITAL_COMMUNITY)
Admission: EM | Admit: 2011-08-27 | Discharge: 2011-08-27 | Disposition: A | Discharge status: Home / Self Care | Payer: Medicare Other | Attending: Emergency Medicine | Admitting: Emergency Medicine

## 2011-08-27 DIAGNOSIS — G8929 Other chronic pain: Secondary | ICD-10-CM | POA: Insufficient documentation

## 2011-08-27 DIAGNOSIS — Z9889 Other specified postprocedural states: Secondary | ICD-10-CM | POA: Insufficient documentation

## 2011-08-27 DIAGNOSIS — I10 Essential (primary) hypertension: Secondary | ICD-10-CM | POA: Insufficient documentation

## 2011-08-27 DIAGNOSIS — M549 Dorsalgia, unspecified: Secondary | ICD-10-CM | POA: Insufficient documentation

## 2011-08-27 DIAGNOSIS — F17200 Nicotine dependence, unspecified, uncomplicated: Secondary | ICD-10-CM | POA: Insufficient documentation

## 2011-08-27 DIAGNOSIS — R1031 Right lower quadrant pain: Secondary | ICD-10-CM | POA: Insufficient documentation

## 2011-08-27 DIAGNOSIS — Z79899 Other long term (current) drug therapy: Secondary | ICD-10-CM | POA: Insufficient documentation

## 2011-08-27 HISTORY — DX: Essential (primary) hypertension: I10

## 2011-08-27 HISTORY — DX: Dorsalgia, unspecified: M54.9

## 2011-08-27 MED ORDER — ACETAMINOPHEN-CODEINE 300-60 MG PO TABS: 1.0000 | ORAL_TABLET | ORAL | Status: AC | PRN

## 2011-08-27 NOTE — ED Provider Notes (Signed)
History     CSN: [id number] Arrival date & time: 08/27/2011  4:48 PM   First MD Initiated Contact with Patient 08/27/11 2129      Chief Complaint  Patient presents with  . Abdominal Pain    (Consider location/radiation/quality/duration/timing/severity/associated sxs/prior treatment) Patient is a 60 y.o. male presenting with abdominal pain. The history is provided by the patient.  Abdominal Pain The primary symptoms of the illness include abdominal pain. The primary symptoms of the illness do not include fever, nausea or vomiting. Primary symptoms comment: Lower right pain near suprapubic area and inguinal fold.  Episode onset: He has chronic back pain due to "slipped disc" and has had pain in abdomen for several months that is now worse, and associated with a 'mass' in that area.  The patient has not had a change in bowel habit (No increased size of the swelling or increased pain with bowel movements, which remain normal. ). Symptoms associated with the illness do not include chills, urgency or hematuria.    Past Medical History  Diagnosis Date  . Hypertension   . Back pain     Past Surgical History  Procedure Date  . Orthopedic surgery     No family history on file.  History  Substance Use Topics  . Smoking status: Current Everyday Smoker  . Smokeless tobacco: Not on file  . Alcohol Use: No      Review of Systems  Constitutional: Negative for fever and chills.  HENT: Negative.   Respiratory: Negative.   Cardiovascular: Negative.   Gastrointestinal: Positive for abdominal pain. Negative for nausea and vomiting.  Genitourinary: Negative for urgency and hematuria.  Musculoskeletal: Negative.   Skin: Negative.   Neurological: Negative.     Allergies  Morphine and related  Home Medications   Current Outpatient Rx  Name Route Sig Dispense Refill  . ALPRAZOLAM 2 MG PO TABS Oral Take 2 mg by mouth 3 (three) times daily as needed. For anxiety     . NADOLOL 40 MG  PO TABS Oral Take 120 mg by mouth daily.      Marland Kitchen NAPROXEN 500 MG PO TABS Oral Take 500 mg by mouth 2 (two) times daily as needed. For pain       BP 167/94  Pulse 72  Temp(Src) 98.4 F (36.9 C) (Oral)  Resp 19  SpO2 99%  Physical Exam  Constitutional: He appears well-developed and well-nourished.  HENT:  Head: Normocephalic.  Neck: Normal range of motion. Neck supple.  Cardiovascular: Normal rate and regular rhythm.   Pulmonary/Chest: Effort normal and breath sounds normal.  Abdominal: Soft. Bowel sounds are normal. There is no tenderness. There is no rebound and no guarding. Hernia confirmed negative in the right inguinal area and confirmed negative in the left inguinal area.       Lower abdomen is nontender without mass or swelling.   Genitourinary: Penis normal. Right testis shows no mass, no swelling and no tenderness. Left testis shows no mass, no swelling and no tenderness. Circumcised.  Musculoskeletal: Normal range of motion.  Neurological: He is alert. No cranial nerve deficit.  Skin: Skin is warm and dry. No rash noted.  Psychiatric: He has a normal mood and affect.    ED Course  Procedures (including critical care time)  Labs Reviewed - No data to display No results found.   No diagnosis found.    MDM  The patient reports being out of his pain medications and specifically requests Vicodin 7.5 mg "with  codeine". Discussed that he would need to follow up with his doctor for recheck and further regular pain control this week.         Rodena Medin, PA 08/27/11 2234

## 2011-08-27 NOTE — ED Notes (Signed)
Patient reports he has noted an area on his right lower abd/groin that has been there since July.  He has noted increased pain in the area for 1 week and yesterday he noted worse pain and itching in the area.  He describes the area as been a soft round raised area.

## 2011-09-04 NOTE — ED Provider Notes (Signed)
Medical screening examination/treatment/procedure(s) were performed by non-physician practitioner and as supervising physician I was immediately available for consultation/collaboration.   Adja Ruff E Pratik Dalziel, MD 09/04/11 0741 

## 2011-10-01 ENCOUNTER — Ambulatory Visit (INDEPENDENT_AMBULATORY_CARE_PROVIDER_SITE_OTHER): Payer: Medicare Other | Admitting: General Surgery

## 2011-10-01 ENCOUNTER — Encounter (INDEPENDENT_AMBULATORY_CARE_PROVIDER_SITE_OTHER): Payer: Self-pay | Admitting: General Surgery

## 2011-10-01 VITALS — BP 142/96 | HR 104 | Temp 98.0°F | Ht 72.0 in | Wt 172.8 lb

## 2011-10-01 DIAGNOSIS — K4090 Unilateral inguinal hernia, without obstruction or gangrene, not specified as recurrent: Secondary | ICD-10-CM

## 2011-10-01 MED ORDER — TRAMADOL HCL 50 MG PO TABS: 50.0000 mg | ORAL_TABLET | Freq: Four times a day (QID) | ORAL | Status: DC | PRN

## 2011-10-01 NOTE — Progress Notes (Signed)
Addended by: Cherylynn Ridges III on: 10/01/2011 10:48 AM   Modules accepted: Orders

## 2011-10-01 NOTE — Progress Notes (Signed)
Patient ID: Cole Fuller, male   DOB: [birth year], 61 y.o.   MRN: [id number]  Chief Complaint  Patient presents with  . Pre-op Exam    eval RIH    HPI Cole Fuller is a 61 y.o. male.  Right inguinal hernia HPI  Past Medical History  Diagnosis Date  . Hypertension   . Back pain   . Chills   . Bruises easily   . Arthritis pain     Past Surgical History  Procedure Date  . Orthopedic surgery   . Stents in leg     right  . Heart stents     History reviewed. No pertinent family history.  Social History History  Substance Use Topics  . Smoking status: Current Everyday Smoker -- 1.0 packs/day    Types: Cigarettes  . Smokeless tobacco: Not on file  . Alcohol Use: No    Allergies  Allergen Reactions  . Morphine And Related Hives and Itching  . Nadolol     Shortness of breath    Current Outpatient Prescriptions  Medication Sig Dispense Refill  . alprazolam (XANAX) 2 MG tablet Take 2 mg by mouth 3 (three) times daily as needed. For anxiety       . HYDROcodone-acetaminophen (VICODIN ES) 7.5-750 MG per tablet         Review of Systems Review of Systems  Constitutional: Negative.   HENT: Negative.   Eyes: Negative.   Respiratory: Positive for chest tightness (not frequent) and shortness of breath (patient states SOB with nadolol.). Negative for apnea.   Cardiovascular: Negative.   Gastrointestinal: Negative.   Genitourinary: Negative.   Musculoskeletal: Negative.   Neurological: Negative.   Hematological: Negative.     Blood pressure 142/96, pulse 104, temperature 98 F (36.7 C), temperature source Temporal, height 6' (1.829 m), weight 172 lb 12.8 oz (78.382 kg), SpO2 98.00%.  Physical Exam Physical Exam  Constitutional: He is oriented to person, place, and time. He appears well-developed and well-nourished.  HENT:  Head: Normocephalic and atraumatic.  Eyes: Pupils are equal, round, and reactive to light.  Neck: Normal range of motion. Neck supple.    Cardiovascular: Normal rate, regular rhythm and normal heart sounds.   Pulmonary/Chest: Effort normal and breath sounds normal.  Abdominal: Soft. Normal appearance. There is no tenderness. A hernia is present. Hernia confirmed positive in the right inguinal area (reducible). Hernia confirmed negative in the left inguinal area.  Genitourinary: Penis normal.  Musculoskeletal: Normal range of motion.  Neurological: He is alert and oriented to person, place, and time.  Skin: Skin is warm.  Psychiatric: He has a normal mood and affect. His behavior is normal. Judgment and thought content normal.    Data Reviewed MRI report.Office notes.  Assessment    Reducible RIH History of significant PVD and CAD, stents in both areas Smoker Poorly controlled BP    Plan    Needs better BP control and cardiac evaluation.  Likely stress test. See him again in one month       Cole Fuller,Cole Fuller O 10/01/2011, 10:37 AM

## 2011-10-02 ENCOUNTER — Encounter (INDEPENDENT_AMBULATORY_CARE_PROVIDER_SITE_OTHER): Payer: Self-pay

## 2011-11-05 ENCOUNTER — Encounter (INDEPENDENT_AMBULATORY_CARE_PROVIDER_SITE_OTHER): Payer: Self-pay

## 2011-11-05 ENCOUNTER — Encounter (INDEPENDENT_AMBULATORY_CARE_PROVIDER_SITE_OTHER): Payer: Self-pay | Admitting: General Surgery

## 2011-11-05 ENCOUNTER — Ambulatory Visit (INDEPENDENT_AMBULATORY_CARE_PROVIDER_SITE_OTHER): Payer: Medicare Other | Admitting: General Surgery

## 2011-11-05 VITALS — BP 162/86 | HR 64 | Temp 98.2°F | Resp 18 | Ht 72.0 in | Wt 175.4 lb

## 2011-11-05 DIAGNOSIS — K4090 Unilateral inguinal hernia, without obstruction or gangrene, not specified as recurrent: Secondary | ICD-10-CM

## 2011-11-05 NOTE — Progress Notes (Signed)
HPI The patient comes in after having had cardiac clearance.  PE On examination he continues to have a large what appears to be a direct inguinal hernia.  Studiy review Studies from his cardiac evaluation have been sent over where be sent over by Dr. Leretha Pol office  Assessment Right inguinal hernia  Plan For repair in the near future

## 2011-11-22 ENCOUNTER — Other Ambulatory Visit (INDEPENDENT_AMBULATORY_CARE_PROVIDER_SITE_OTHER): Payer: Self-pay | Admitting: General Surgery

## 2011-11-24 ENCOUNTER — Other Ambulatory Visit (INDEPENDENT_AMBULATORY_CARE_PROVIDER_SITE_OTHER): Payer: Self-pay | Admitting: General Surgery

## 2011-12-05 ENCOUNTER — Telehealth (INDEPENDENT_AMBULATORY_CARE_PROVIDER_SITE_OTHER): Payer: Self-pay | Admitting: General Surgery

## 2011-12-05 NOTE — Telephone Encounter (Signed)
I STILL have not received cardiac clearance on this patient. He has been told I cannot schedule until it is received. I will call Dr Hazle Coca office and check on it again.

## 2011-12-05 NOTE — Telephone Encounter (Signed)
We will just have to wait for this clearance

## 2011-12-11 ENCOUNTER — Telehealth (INDEPENDENT_AMBULATORY_CARE_PROVIDER_SITE_OTHER): Payer: Self-pay

## 2011-12-11 NOTE — Telephone Encounter (Signed)
Returning patients calls again regarding cardiac clearance. He was set up appointment with Dr. Allyson Sabal for office visit to get cardiac clearance. We cannot schedule surgery until he sees Dr. Allyson Sabal and we received copy of clearance. I am calling just to make sure he knows to go to appointment with Dr. Allyson Sabal since he keeps calling asking if we received clearance. He was given appointment information a while back regarding Dr. Renelda Mom appointment but since he keeps calling Im not sure where the confusion is lying with him on waiting for him to see Dr. Allyson Sabal. I left message to call me so I can clear up any confusion on why we have not received clearance nor scheduled surgery.

## 2011-12-27 ENCOUNTER — Encounter (HOSPITAL_COMMUNITY): Payer: Self-pay | Admitting: Pharmacy Technician

## 2012-01-03 ENCOUNTER — Encounter (HOSPITAL_COMMUNITY): Payer: Self-pay | Admitting: *Deleted

## 2012-01-03 ENCOUNTER — Encounter (HOSPITAL_COMMUNITY)
Admission: RE | Admit: 2012-01-03 | Discharge: 2012-01-03 | Disposition: A | Discharge status: Home / Self Care | Payer: Medicare Other | Source: Ambulatory Visit | Attending: General Surgery | Admitting: General Surgery

## 2012-01-03 LAB — BASIC METABOLIC PANEL
CO2: 29 mEq/L (ref 19–32)
Calcium: 9.5 mg/dL (ref 8.4–10.5)
GFR calc Af Amer: >90 mL/min (ref >90)
Sodium: 139 mEq/L (ref 135–145)

## 2012-01-03 LAB — SURGICAL PCR SCREEN
MRSA, PCR: NEGATIVE
Staphylococcus aureus: NEGATIVE

## 2012-01-03 LAB — CBC
MCV: 99 fL (ref 78.0–100.0)
Platelets: 224 10*3/uL (ref 150–400)
RBC: 4.18 MIL/uL — ABNORMAL LOW (ref 4.22–5.81)
WBC: 12.2 10*3/uL — ABNORMAL HIGH (ref 4.0–10.5)

## 2012-01-03 LAB — DIFFERENTIAL
Basophils Absolute: 0.1 10*3/uL (ref 0.0–0.1)
Eosinophils Relative: 1 % (ref 0–5)
Lymphocytes Relative: 35 % (ref 12–46)
Lymphs Abs: 4.2 10*3/uL — ABNORMAL HIGH (ref 0.7–4.0)
Neutro Abs: 6.8 10*3/uL (ref 1.7–7.7)
Neutrophils Relative %: 56 % (ref 43–77)

## 2012-01-03 NOTE — Pre-Procedure Instructions (Addendum)
20 Cole Fuller  01/03/2012   Your procedure is scheduled on:  Friday May 3  Report to [facility] at 9:30 AM.  Call this number if you have problems the morning of surgery: [phone]   Remember:   Do not eat food:After Midnight.  May have clear liquids: up to 4 Hours before arrival.  Clear liquids include soda, tea, black coffee, apple or grape juice, broth.  Take these medicines the morning of surgery with A SIP OF WATER: hydralazine, vicodin, xanax if needed   Do not wear jewelry, make-up or nail polish.  Do not wear lotions, powders, or perfumes. You may wear deodorant.  Do not shave 48 hours prior to surgery.  Do not bring valuables to the hospital.  Contacts, dentures or bridgework may not be worn into surgery.  Leave suitcase in the car. After surgery it may be brought to your room.  For patients admitted to the hospital, checkout time is 11:00 AM the day of discharge.   Patients discharged the day of surgery will not be allowed to drive home.  Name and phone number of your driver: Cole Fuller [phone]   Special Instructions: CHG Shower Use Special Wash: 1/2 bottle night before surgery and 1/2 bottle morning of surgery.   Please read over the following fact sheets that you were given: Pain Booklet, Coughing and Deep Breathing and Surgical Site Infection Prevention

## 2012-01-03 NOTE — Progress Notes (Signed)
Pt's wife will be providing transportation DOS; Chris Cripps [phone]

## 2012-01-03 NOTE — Consult Note (Signed)
Anesthesia Chart Review:  Patient is a 61 year old male scheduled for a right IHR on 01/10/12.  History includes smoking, HTN, arthritis, back pain, anxiety, PVD s/p PTCA right SFA '04, CAD s/p PTCA/stent to distal and proximal mid CX '03 requiring cutting balloon PCI '03.  VSS at PAT with BP 142/70.  His Cardiologist is Dr. Allyson Sabal [facility]).  He was seen on 12/13/11 for preoperative evaluation.  EKG then showed NSR.  He had a stress test on 10/17/11 that showed normal myocardial perfusion, post-stress EF 60%, no significant wall motion abnormalities.  He was ultimately cleared for this procedure.  Echo from 10/17/11 [facility]) showed moderate LVH, normal systolic function and at least mild diastolic dysfunction, EF 55-60%, no significant valvular abnormalities, borderline evidence of increased LA pressure.    His last cardiac cath was on 09/20/04 and showed: 1. Left main normal.  2. LAD: The LAD had, at most, 40% segmental proximal stenosis.  3. Left circumflex: The two previously placed stents were widely patent.  The OM branch which arose between the two stents was widely patent, as well. There were minor irregularities in the distal circumflex.  4. Right coronary artery: Small codominant vessel with a stable 90% distal stenosis just proximal to the genu of the vessel.  5. Left ventriculography: Left ventriculogram was performed using 25 mL of Visipaque dye at 12 mL per second. The overall LVEF is estimated at greater than 60% without focal wall motion abnormalities.   Labs noted.  WBC 12.2.  H/H 15.0/41.4.  Cr 0.78.  He thought Dr. Allyson Sabal had done a CXR on him within the last year, but they do not have one on file.  None were noted under [facility].  If no recent CXR obtained, he will need one on the day of surgery.    If no significant change in his status, then plan to proceed.  Shonna Chock, PA-C

## 2012-01-03 NOTE — Progress Notes (Addendum)
Spoke with Jaynie Collins, PA-C re pt. We will obtain cardiac records from [facility], Dr. Allyson Sabal, for her review. She states does not need to speak with pt today and pt states he does not have any questions for her today.

## 2012-01-03 NOTE — Progress Notes (Signed)
Pt stated he had EKG, CXR as part of cardiac clearance by Dr. Allyson Sabal. Reports that all information was faxed to Dr. Dixon Boos office. Awaiting receipt of information from [facility].

## 2012-01-09 MED ORDER — CEFAZOLIN SODIUM-DEXTROSE 2-3 GM-% IV SOLR
2.0000 g | INTRAVENOUS | Status: AC
  Administered 2012-01-10: 2 g via INTRAVENOUS
  Filled 2012-01-09: qty 50

## 2012-01-10 ENCOUNTER — Ambulatory Visit (HOSPITAL_COMMUNITY)
Admission: RE | Admit: 2012-01-10 | Discharge: 2012-01-10 | Disposition: A | Discharge status: Home / Self Care | Payer: Medicare Other | Source: Ambulatory Visit | Attending: General Surgery | Admitting: General Surgery

## 2012-01-10 ENCOUNTER — Ambulatory Visit (HOSPITAL_COMMUNITY): Payer: Medicare Other | Admitting: Vascular Surgery

## 2012-01-10 ENCOUNTER — Encounter (HOSPITAL_COMMUNITY): Payer: Self-pay | Admitting: Vascular Surgery

## 2012-01-10 ENCOUNTER — Encounter (HOSPITAL_COMMUNITY): Payer: Self-pay | Admitting: *Deleted

## 2012-01-10 ENCOUNTER — Encounter (HOSPITAL_COMMUNITY)
Admission: RE | Disposition: A | Discharge status: Home / Self Care | Payer: Self-pay | Source: Ambulatory Visit | Attending: General Surgery

## 2012-01-10 ENCOUNTER — Ambulatory Visit (HOSPITAL_COMMUNITY): Payer: Medicare Other

## 2012-01-10 DIAGNOSIS — K4090 Unilateral inguinal hernia, without obstruction or gangrene, not specified as recurrent: Secondary | ICD-10-CM | POA: Insufficient documentation

## 2012-01-10 DIAGNOSIS — F17200 Nicotine dependence, unspecified, uncomplicated: Secondary | ICD-10-CM | POA: Insufficient documentation

## 2012-01-10 DIAGNOSIS — I10 Essential (primary) hypertension: Secondary | ICD-10-CM | POA: Insufficient documentation

## 2012-01-10 HISTORY — PX: INGUINAL HERNIA REPAIR: SHX194

## 2012-01-10 HISTORY — DX: Unspecified osteoarthritis, unspecified site: M19.90

## 2012-01-10 HISTORY — DX: Atherosclerotic heart disease of native coronary artery without angina pectoris: I25.10

## 2012-01-10 HISTORY — DX: Peripheral vascular disease, unspecified: I73.9

## 2012-01-10 HISTORY — DX: Anxiety disorder, unspecified: F41.9

## 2012-01-10 SURGERY — REPAIR, HERNIA, INGUINAL, ADULT
Anesthesia: General | Site: Groin | Laterality: Right | Wound class: Clean

## 2012-01-10 MED ORDER — HYDROCODONE-ACETAMINOPHEN 7.5-750 MG PO TABS: 1.0000 | ORAL_TABLET | Freq: Four times a day (QID) | ORAL | Status: AC | PRN

## 2012-01-10 MED ORDER — FENTANYL CITRATE 0.05 MG/ML IJ SOLN
INTRAMUSCULAR | Status: DC | PRN
  Administered 2012-01-10 (×5): 50 ug via INTRAVENOUS

## 2012-01-10 MED ORDER — DEXAMETHASONE SODIUM PHOSPHATE 10 MG/ML IJ SOLN
INTRAMUSCULAR | Status: DC | PRN
  Administered 2012-01-10: 8 mg via INTRAVENOUS

## 2012-01-10 MED ORDER — ONDANSETRON HCL 4 MG/2ML IJ SOLN
INTRAMUSCULAR | Status: DC | PRN
  Administered 2012-01-10: 4 mg via INTRAVENOUS

## 2012-01-10 MED ORDER — LACTATED RINGERS IV SOLN
INTRAVENOUS | Status: DC
  Administered 2012-01-10 (×2): via INTRAVENOUS

## 2012-01-10 MED ORDER — SODIUM CHLORIDE 0.9 % IR SOLN
Status: DC | PRN
  Administered 2012-01-10: 12:00:00

## 2012-01-10 MED ORDER — MIDAZOLAM HCL 2 MG/2ML IJ SOLN
INTRAMUSCULAR | Status: AC
  Filled 2012-01-10: qty 2

## 2012-01-10 MED ORDER — 0.9 % SODIUM CHLORIDE (POUR BTL) OPTIME
TOPICAL | Status: DC | PRN
  Administered 2012-01-10: 1000 mL

## 2012-01-10 MED ORDER — ONDANSETRON HCL 4 MG/2ML IJ SOLN: 4.0000 mg | Freq: Once | INTRAMUSCULAR | Status: DC | PRN

## 2012-01-10 MED ORDER — PROPOFOL 10 MG/ML IV EMUL
INTRAVENOUS | Status: DC | PRN
  Administered 2012-01-10: 160 mg via INTRAVENOUS

## 2012-01-10 MED ORDER — MIDAZOLAM HCL 2 MG/2ML IJ SOLN
1.0000 mg | INTRAMUSCULAR | Status: DC | PRN
Start: 2012-01-10 — End: 2012-01-10
  Administered 2012-01-10 (×2): 1 mg via INTRAVENOUS

## 2012-01-10 MED ORDER — HYDROMORPHONE HCL PF 1 MG/ML IJ SOLN: 0.2500 mg | INTRAMUSCULAR | Status: DC | PRN

## 2012-01-10 MED ORDER — FENTANYL CITRATE 0.05 MG/ML IJ SOLN
INTRAMUSCULAR | Status: AC
  Filled 2012-01-10: qty 2

## 2012-01-10 MED ORDER — ACETAMINOPHEN 10 MG/ML IV SOLN: 1000.0000 mg | Freq: Four times a day (QID) | INTRAVENOUS | Status: DC

## 2012-01-10 MED ORDER — FENTANYL CITRATE 0.05 MG/ML IJ SOLN
50.0000 ug | INTRAMUSCULAR | Status: DC | PRN
  Administered 2012-01-10 (×2): 50 ug via INTRAVENOUS

## 2012-01-10 SURGICAL SUPPLY — 53 items
BAG DECANTER FOR FLEXI CONT (MISCELLANEOUS) ×2 IMPLANT
BLADE SURG 10 STRL SS (BLADE) ×2 IMPLANT
BLADE SURG 15 STRL LF DISP TIS (BLADE) ×1 IMPLANT
BLADE SURG 15 STRL SS (BLADE) ×1
BLADE SURG ROTATE 9660 (MISCELLANEOUS) ×2 IMPLANT
CANISTER SUCTION 2500CC (MISCELLANEOUS) IMPLANT
CHLORAPREP W/TINT 26ML (MISCELLANEOUS) ×2 IMPLANT
CLEANER TIP ELECTROSURG 2X2 (MISCELLANEOUS) ×2 IMPLANT
CLOTH BEACON ORANGE TIMEOUT ST (SAFETY) ×2 IMPLANT
COVER SURGICAL LIGHT HANDLE (MISCELLANEOUS) ×2 IMPLANT
DECANTER SPIKE VIAL GLASS SM (MISCELLANEOUS) ×2 IMPLANT
DERMABOND ADVANCED (GAUZE/BANDAGES/DRESSINGS)
DERMABOND ADVANCED .7 DNX12 (GAUZE/BANDAGES/DRESSINGS) IMPLANT
DRAIN PENROSE 1/2X12 LTX STRL (WOUND CARE) ×2 IMPLANT
DRAPE LAPAROTOMY TRNSV 102X78 (DRAPE) ×2 IMPLANT
DRAPE UTILITY 15X26 W/TAPE STR (DRAPE) ×4 IMPLANT
DRSG TEGADERM 4X4.75 (GAUZE/BANDAGES/DRESSINGS) IMPLANT
ELECT REM PT RETURN 9FT ADLT (ELECTROSURGICAL) ×2
ELECTRODE REM PT RTRN 9FT ADLT (ELECTROSURGICAL) ×1 IMPLANT
GAUZE SPONGE 4X4 16PLY XRAY LF (GAUZE/BANDAGES/DRESSINGS) ×2 IMPLANT
GLOVE BIOGEL PI IND STRL 7.0 (GLOVE) ×3 IMPLANT
GLOVE BIOGEL PI IND STRL 8 (GLOVE) ×1 IMPLANT
GLOVE BIOGEL PI INDICATOR 7.0 (GLOVE) ×3
GLOVE BIOGEL PI INDICATOR 8 (GLOVE) ×1
GLOVE ECLIPSE 6.5 STRL STRAW (GLOVE) ×2 IMPLANT
GLOVE ECLIPSE 7.5 STRL STRAW (GLOVE) ×2 IMPLANT
GLOVE SURG SS PI 7.0 STRL IVOR (GLOVE) ×6 IMPLANT
GOWN STRL NON-REIN LRG LVL3 (GOWN DISPOSABLE) ×8 IMPLANT
KIT BASIN OR (CUSTOM PROCEDURE TRAY) ×2 IMPLANT
KIT ROOM TURNOVER OR (KITS) ×2 IMPLANT
MESH HERNIA 3X6 (Mesh General) ×2 IMPLANT
NEEDLE HYPO 25GX1X1/2 BEV (NEEDLE) ×2 IMPLANT
NS IRRIG 1000ML POUR BTL (IV SOLUTION) ×2 IMPLANT
PACK SURGICAL SETUP 50X90 (CUSTOM PROCEDURE TRAY) ×2 IMPLANT
PAD ARMBOARD 7.5X6 YLW CONV (MISCELLANEOUS) ×4 IMPLANT
PENCIL BUTTON HOLSTER BLD 10FT (ELECTRODE) ×2 IMPLANT
SPECIMEN JAR SMALL (MISCELLANEOUS) IMPLANT
SPONGE INTESTINAL PEANUT (DISPOSABLE) ×2 IMPLANT
SPONGE LAP 18X18 X RAY DECT (DISPOSABLE) ×2 IMPLANT
STRIP CLOSURE SKIN 1/2X4 (GAUZE/BANDAGES/DRESSINGS) ×2 IMPLANT
SUT ETHIBOND 0 MO6 C/R (SUTURE) ×2 IMPLANT
SUT MON AB 4-0 PC3 18 (SUTURE) ×2 IMPLANT
SUT PROLENE 0 CT 2 (SUTURE) ×4 IMPLANT
SUT VIC AB 3-0 SH 27 (SUTURE) ×4
SUT VIC AB 3-0 SH 27X BRD (SUTURE) ×2 IMPLANT
SUT VICRYL AB 3 0 TIES (SUTURE) ×2 IMPLANT
SYR BULB 3OZ (MISCELLANEOUS) ×2 IMPLANT
SYR CONTROL 10ML LL (SYRINGE) ×2 IMPLANT
TOWEL OR 17X24 6PK STRL BLUE (TOWEL DISPOSABLE) ×2 IMPLANT
TOWEL OR 17X26 10 PK STRL BLUE (TOWEL DISPOSABLE) ×2 IMPLANT
TUBE CONNECTING 12X1/4 (SUCTIONS) ×2 IMPLANT
WATER STERILE IRR 1000ML POUR (IV SOLUTION) IMPLANT
YANKAUER SUCT BULB TIP NO VENT (SUCTIONS) ×2 IMPLANT

## 2012-01-10 NOTE — H&P (Signed)
  Patient ID: BOND GRIESHOP, male DOB: March 22, 1951, 61 y.o. MRN: [id number]  Chief Complaint   Patient presents with   .  Pre-op Exam     eval RIH   HPI  Cole Fuller is a 61 y.o. male. Right inguinal hernia  HPI  Past Medical History   Diagnosis  Date   .  Hypertension    .  Back pain    .  Chills    .  Bruises easily    .  Arthritis pain     Past Surgical History   Procedure  Date   .  Orthopedic surgery    .  Stents in leg      right   .  Heart stents    History reviewed. No pertinent family history.  Social History  History   Substance Use Topics   .  Smoking status:  Current Everyday Smoker -- 1.0 packs/day     Types:  Cigarettes   .  Smokeless tobacco:  Not on file   .  Alcohol Use:  No    Allergies   Allergen  Reactions   .  Morphine And Related  Hives and Itching   .  Nadolol      Shortness of breath    Current Outpatient Prescriptions   Medication  Sig  Dispense  Refill   .  alprazolam (XANAX) 2 MG tablet  Take 2 mg by mouth 3 (three) times daily as needed. For anxiety     .  HYDROcodone-acetaminophen (VICODIN ES) 7.5-750 MG per tablet      Review of Systems  Review of Systems  Constitutional: Negative.  HENT: Negative.  Eyes: Negative.  Respiratory: Positive for chest tightness (not frequent) and shortness of breath (patient states SOB with nadolol.). Negative for apnea.  Cardiovascular: Negative.  Gastrointestinal: Negative.  Genitourinary: Negative.  Musculoskeletal: Negative.  Neurological: Negative.  Hematological: Negative.  Blood pressure 142/96, pulse 104, temperature 98 F (36.7 C), temperature source Temporal, height 6' (1.829 m), weight 172 lb 12.8 oz (78.382 kg), SpO2 98.00%.  Physical Exam  Physical Exam  Constitutional: He is oriented to person, place, and time. He appears well-developed and well-nourished.  HENT:  Head: Normocephalic and atraumatic.  Eyes: Pupils are equal, round, and reactive to light.  Neck: Normal range of  motion. Neck supple.  Cardiovascular: Normal rate, regular rhythm and normal heart sounds.  Pulmonary/Chest: Effort normal and breath sounds normal.  Abdominal: Soft. Normal appearance. There is no tenderness. A hernia is present. Hernia confirmed positive in the right inguinal area (reducible). Hernia confirmed negative in the left inguinal area.  Genitourinary: Penis normal.  Musculoskeletal: Normal range of motion.  Neurological: He is alert and oriented to person, place, and time.  Skin: Skin is warm.  Psychiatric: He has a normal mood and affect. His behavior is normal. Judgment and thought content normal.  Data Reviewed  MRI report.Office notes.  Assessment  Reducible RIH  History of significant PVD and CAD, stents in both areas  Smoker  Poorly controlled BP  Plan  Needs better BP control and cardiac evaluation. Likely stress test.  See him again in one month   The patient has had cardiac clearance and is ready for open repair of his reducible RIH

## 2012-01-10 NOTE — Anesthesia Preprocedure Evaluation (Signed)
Anesthesia Evaluation  Patient identified by MRN, date of birth, ID band Patient awake    Reviewed: Allergy & Precautions, H&P , NPO status , Patient's Chart, lab work & pertinent test results  Airway Mallampati: II      Dental  (+) Edentulous Upper and Edentulous Lower   Pulmonary Current Smoker,  + rhonchi         Cardiovascular negative cardio ROS  Rhythm:Regular Rate:Normal     Neuro/Psych    GI/Hepatic   Endo/Other    Renal/GU      Musculoskeletal   Abdominal   Peds  Hematology   Anesthesia Other Findings   Reproductive/Obstetrics                           Anesthesia Physical Anesthesia Plan  ASA: III  Anesthesia Plan: General   Post-op Pain Management:    Induction: Intravenous  Airway Management Planned: LMA  Additional Equipment:   Intra-op Plan:   Post-operative Plan:   Informed Consent: I have reviewed the patients History and Physical, chart, labs and discussed the procedure including the risks, benefits and alternatives for the proposed anesthesia with the patient or authorized representative who has indicated his/her understanding and acceptance.     Plan Discussed with: CRNA and Surgeon  Anesthesia Plan Comments: (Smoker  htn  PVD s/p stents (-) cardiolite stress test 2/13 EF 60%  Plan GA with TAP block  Kipp Brood, MD)        Anesthesia Quick Evaluation

## 2012-01-10 NOTE — Anesthesia Procedure Notes (Signed)
Anesthesia Regional Block:  TAP block  Pre-Anesthetic Checklist: ,, timeout performed, Correct Patient, Correct Site, Correct Laterality, Correct Procedure, Correct Position, site marked, Risks and benefits discussed,  Surgical consent,  Pre-op evaluation,  At surgeon's request and post-op pain management  Laterality: Right  Prep: chloraprep       Needles:   Needle Type: Echogenic Stimulator Needle     Needle Length:cm 9 cm Needle Gauge: 22 and 22 G    Additional Needles:  Procedures: ultrasound guided TAP block Narrative:  Start time: 01/10/2012 10:52 AM End time: 01/10/2012 11:02 AM  Performed by: Personally   Additional Notes: 30 cc 0.5% Marcaine 1:200 Epi injected without difficulty  Kipp Brood, MD  TAP block

## 2012-01-10 NOTE — Op Note (Signed)
OPERATIVE REPORT  DATE OF OPERATION: 01/10/2012  PATIENT:  Cole Fuller  61 y.o. male  PRE-OPERATIVE DIAGNOSIS:  Right inguinal hernia  POST-OPERATIVE DIAGNOSIS:  Right inguinal hernia  PROCEDURE:  Procedure(s): HERNIA REPAIR INGUINAL ADULT  SURGEON:  Surgeon(s): Cherylynn Ridges, MD  ASSISTANT: None  ANESTHESIA:   general  EBL: <50 ml  BLOOD ADMINISTERED: none  DRAINS: none   SPECIMEN:  No Specimen  COUNTS CORRECT:  YES  PROCEDURE DETAILS: The patient was taken to the operating room and placed on the table in the supine position. After an adequate general laryngeal airway anesthetic was administered he was prepped and draped in the usual sterile manner exposing his right inguinal and groin area.  After proper time out was performed identifying the patient and the procedure to be performed a right transverse curvilinear incision above the inguinal ligament was made using a #10 blade and taken down to subcutaneous tissue. We then used electrocautery to dissect down to the fascia of the external oblique muscle. Through the superficial ring exposing the spermatic cord and a large direct hernia which is protruding down towards the superficial ring.  We dissected out the spermatic cord and placed in a work bench using Penrose drain. The large direct hernia sac was imbricated on itself using 0 Ethibond sutures.   We repaired the floor of the inguinal canal using a oval piece of mesh measuring 6 x 4 cm in size attaching it to the conjoin tendon anterior medially and reflected portion of the inguinal ligament and sterile laterally. We took the mesh above the) making sure that the weakness in that area was covered and secured.  The mesh was a polypropylene mesh which been soaked in antibiotic solution. We then irrigated the wound with antibiotic solution. The ilioinguinal nerve was identified and preserved and not entrapped. A cholecystotomy fascia on top the spermatic cord using running  3-0 Vicryl. Carpus fascia was reapproximated using interrupted 3-0 Vicryl sutures and the skin was closed using running subcuticular stitch of 4-0 Monocryl. Dermabond Steri-Strips and Tegaderm were used to complete the dressings all counts were correct.  PATIENT DISPOSITION:  PACU - hemodynamically stable.   Nalee Lightle III,Amani Marseille O 5/3/201312:28 PM 2

## 2012-01-10 NOTE — Preoperative (Signed)
Beta Blockers   Reason not to administer Beta Blockers:Not Applicable 

## 2012-01-10 NOTE — Progress Notes (Signed)
Patient states pain is 10 of 10. He received 2 mg dilaudid and IV Tylenol per PACU RN Liz Beach). I informed patient I would call surgeon for further pain management orders, however, patient refused and stated he wished to go home. I again asked prior to discharge, but he stated "nothing will help" and he wants to go home with Vicodin prescription from Dr. Lindie Spruce.

## 2012-01-10 NOTE — Discharge Instructions (Signed)
Inguinal Hernia, Adult  Care After Refer to this sheet in the next few weeks. These discharge instructions provide you with general information on caring for yourself after you leave the hospital. Your caregiver may also give you specific instructions. Your treatment has been planned according to the most current medical practices available, but unavoidable complications sometimes occur. If you have any problems or questions after discharge, please call your caregiver. HOME CARE INSTRUCTIONS  Put ice on the operative site.   Put ice in a plastic bag.   Place a towel between your skin and the bag.   Leave the ice on for 15 to 20 minutes at a time, 3 to 4 times a day while awake.   Change bandages (dressings) as directed.   Keep the wound dry and clean. The wound may be washed gently with soap and water. Gently blot or dab the wound dry. It is okay to take showers 24 to 48 hours after surgery. Do not take baths, use swimming pools, or use hot tubs for 10 days, or as directed by your caregiver.   Only take over-the-counter or prescription medicines for pain, discomfort, or fever as directed by your caregiver.   Continue your normal diet as directed.   Do not lift anything more than 10 pounds or play contact sports for 3 weeks, or as directed.  SEEK MEDICAL CARE IF:  There is redness, swelling, or increasing pain in the wound.   There is fluid (pus) coming from the wound.   There is drainage from a wound lasting longer than 1 day.   You have an oral temperature above 102 F (38.9 C).   You notice a bad smell coming from the wound or dressing.   The wound breaks open after the stitches (sutures) have been removed.   You notice increasing pain in the shoulders (shoulder strap areas).   You develop dizzy episodes or fainting while standing.   You feel sick to your stomach (nauseous) or throw up (vomit).  SEEK IMMEDIATE MEDICAL CARE IF:  You develop a rash.   You have difficulty  breathing.   You develop a reaction or have side effects to medicines you were given.  MAKE SURE YOU:   Understand these instructions.   Will watch your condition.   Will get help right away if you are not doing well or get worse.  Document Released: 09/26/2006 Document Revised: 08/15/2011 Document Reviewed: 07/26/2009 Exit[employer] Patient Information 2012 [employer]. 

## 2012-01-13 ENCOUNTER — Encounter (HOSPITAL_COMMUNITY): Payer: Self-pay | Admitting: General Surgery

## 2012-01-13 MED FILL — Hydromorphone HCl Inj 1 MG/ML: INTRAMUSCULAR | Qty: 1 | Status: AC

## 2012-01-13 MED FILL — Acetaminophen IV Soln 10 MG/ML: INTRAVENOUS | Qty: 100 | Status: AC

## 2012-01-16 NOTE — Anesthesia Postprocedure Evaluation (Signed)
  Anesthesia Post-op Note  Patient: Cole Fuller  Procedure(s) Performed: Procedure(s) (LRB): HERNIA REPAIR INGUINAL ADULT (Right)  Patient Location: PACU  Anesthesia Type: General  Level of Consciousness: awake, alert  and oriented  Airway and Oxygen Therapy: Patient Spontanous Breathing and Patient connected to nasal cannula oxygen  Post-op Pain: mild  Post-op Assessment: Post-op Vital signs reviewed and Patient's Cardiovascular Status Stable  Post-op Vital Signs: stable  Complications: No apparent anesthesia complications

## 2012-01-16 NOTE — Transfer of Care (Signed)
Immediate Anesthesia Transfer of Care Note  Patient: Cole Fuller  Procedure(s) Performed: Procedure(s) (LRB): HERNIA REPAIR INGUINAL ADULT (Right)  Patient Location: PACU  Anesthesia Type: General  Level of Consciousness: awake, alert  and oriented  Airway & Oxygen Therapy: Patient Spontanous Breathing  Post-op Assessment: Report given to PACU RN and Post -op Vital signs reviewed and stable  Post vital signs: stable  Complications: No apparent anesthesia complications

## 2012-01-24 ENCOUNTER — Telehealth (INDEPENDENT_AMBULATORY_CARE_PROVIDER_SITE_OTHER): Payer: Self-pay | Admitting: General Surgery

## 2012-01-24 NOTE — Telephone Encounter (Signed)
Received FAX refill request for postop pain meds.  Called [facility]:  [phone] to order instead Hydrocodone 5/325 mg, # 30, 1 po Q4-6H prn pain, NO refill.

## 2012-02-05 ENCOUNTER — Other Ambulatory Visit (INDEPENDENT_AMBULATORY_CARE_PROVIDER_SITE_OTHER): Payer: Self-pay

## 2012-02-05 DIAGNOSIS — G8918 Other acute postprocedural pain: Secondary | ICD-10-CM

## 2012-02-05 MED ORDER — HYDROCODONE-ACETAMINOPHEN 10-325 MG PO TABS: 30.0000 | ORAL_TABLET | ORAL | Status: DC | PRN

## 2012-02-05 NOTE — Telephone Encounter (Signed)
Received fax refill request for hydrocodone. Per verbal with Lindie Spruce he okayed refill. Filled fax request out and faxed to [facility] [phone]

## 2012-02-18 ENCOUNTER — Encounter (INDEPENDENT_AMBULATORY_CARE_PROVIDER_SITE_OTHER): Payer: Self-pay | Admitting: General Surgery

## 2012-02-18 ENCOUNTER — Ambulatory Visit (INDEPENDENT_AMBULATORY_CARE_PROVIDER_SITE_OTHER): Payer: Medicare Other | Admitting: General Surgery

## 2012-02-18 VITALS — BP 138/86 | HR 70 | Temp 97.4°F | Resp 16 | Ht 73.0 in | Wt 169.1 lb

## 2012-02-18 DIAGNOSIS — Z09 Encounter for follow-up examination after completed treatment for conditions other than malignant neoplasm: Secondary | ICD-10-CM

## 2012-02-18 HISTORY — DX: Encounter for follow-up examination after completed treatment for conditions other than malignant neoplasm: Z09

## 2012-02-18 NOTE — Progress Notes (Signed)
HPI The patient is status post right inguinal hernia repair doing well. He's had some energy loss loss of appetite but is otherwise doing well  PE There were no infection. Small amount of swelling in the right inguinal area  Studiy review None.  Assessment Doing well status post open right inguinal hernia repair.  Plan Return to see me on a p.r.n. basis

## 2013-01-24 ENCOUNTER — Encounter (HOSPITAL_COMMUNITY): Payer: Self-pay | Admitting: Family Medicine

## 2013-01-24 ENCOUNTER — Emergency Department (HOSPITAL_COMMUNITY)
Admission: EM | Admit: 2013-01-24 | Discharge: 2013-01-24 | Disposition: A | Discharge status: Home / Self Care | Payer: Medicare Other | Attending: Emergency Medicine | Admitting: Emergency Medicine

## 2013-01-24 DIAGNOSIS — M129 Arthropathy, unspecified: Secondary | ICD-10-CM | POA: Insufficient documentation

## 2013-01-24 DIAGNOSIS — R110 Nausea: Secondary | ICD-10-CM | POA: Insufficient documentation

## 2013-01-24 DIAGNOSIS — M545 Low back pain, unspecified: Secondary | ICD-10-CM | POA: Insufficient documentation

## 2013-01-24 DIAGNOSIS — Z79899 Other long term (current) drug therapy: Secondary | ICD-10-CM | POA: Insufficient documentation

## 2013-01-24 DIAGNOSIS — I10 Essential (primary) hypertension: Secondary | ICD-10-CM | POA: Insufficient documentation

## 2013-01-24 DIAGNOSIS — I739 Peripheral vascular disease, unspecified: Secondary | ICD-10-CM | POA: Insufficient documentation

## 2013-01-24 DIAGNOSIS — Z8719 Personal history of other diseases of the digestive system: Secondary | ICD-10-CM | POA: Insufficient documentation

## 2013-01-24 DIAGNOSIS — Z9889 Other specified postprocedural states: Secondary | ICD-10-CM | POA: Insufficient documentation

## 2013-01-24 DIAGNOSIS — F17200 Nicotine dependence, unspecified, uncomplicated: Secondary | ICD-10-CM | POA: Insufficient documentation

## 2013-01-24 DIAGNOSIS — I2510 Atherosclerotic heart disease of native coronary artery without angina pectoris: Secondary | ICD-10-CM | POA: Insufficient documentation

## 2013-01-24 DIAGNOSIS — M542 Cervicalgia: Secondary | ICD-10-CM | POA: Insufficient documentation

## 2013-01-24 DIAGNOSIS — F411 Generalized anxiety disorder: Secondary | ICD-10-CM | POA: Insufficient documentation

## 2013-01-24 MED ORDER — OXYCODONE-ACETAMINOPHEN 10-325 MG PO TABS
1.0000 | ORAL_TABLET | ORAL | Status: DC | PRN
Start: 2013-01-24 — End: 2013-03-22

## 2013-01-24 MED ORDER — ONDANSETRON 4 MG PO TBDP
8.0000 mg | ORAL_TABLET | Freq: Once | ORAL | Status: AC
  Administered 2013-01-24: 8 mg via ORAL
  Filled 2013-01-24: qty 2

## 2013-01-24 MED ORDER — PROMETHAZINE HCL 25 MG PO TABS: 25.0000 mg | ORAL_TABLET | Freq: Four times a day (QID) | ORAL | Status: DC | PRN

## 2013-01-24 MED ORDER — OXYCODONE-ACETAMINOPHEN 5-325 MG PO TABS
2.0000 | ORAL_TABLET | Freq: Once | ORAL | Status: AC
  Administered 2013-01-24: 2 via ORAL
  Filled 2013-01-24: qty 2

## 2013-01-24 NOTE — ED Notes (Addendum)
Per pt sts has been taking narcotics for chronic pain x 6 years and hasn't had any since Thursday. sts he has been having D, chills, HA.

## 2013-01-24 NOTE — ED Notes (Signed)
PT DOES NOT WANT DETOX FROM OXYCODONE. HE WANTS A REFILL OF MEDS.

## 2013-01-24 NOTE — ED Provider Notes (Addendum)
History     CSN: [id number]  Arrival date & time 01/24/13  1637   First MD Initiated Contact with Patient 01/24/13 1657      Chief Complaint  Patient presents with  . Withdrawal    (Consider location/radiation/quality/duration/timing/severity/associated sxs/prior treatment) HPI..... patient has been on Percocet 10/325    5 times a day for a long period of time secondary to chronic pain in his neck, lower back, feet.  He has had stents placed in his legs. He has a primary care physician Dr. Mikeal Hawthorne who typically refill his medications. Friday he accidentally dropped his pain medicine in the toilet. He attempted to see his primary care doctor but they were closed. He now feels anxious and nauseated secondary to withdrawal symptoms  Past Medical History  Diagnosis Date  . Hypertension   . Back pain   . Chills   . Bruises easily   . Arthritis pain   . Inguinal hernia     right  . Coronary artery disease     with stenting in 2004, sees Dr Allyson Sabal  . Arthritis   . Anxiety   . Inguinal hernia     Right  . Peripheral vascular disease     Past Surgical History  Procedure Laterality Date  . Orthopedic surgery    . Stents in leg      right  . Heart stents    . Fracture surgery      left foot  . Cardiac catheterization  2004    with stents x2  . Knee arthroscopy      Right  . Inguinal hernia repair  01/10/2012    Procedure: HERNIA REPAIR INGUINAL ADULT;  Surgeon: Cherylynn Ridges, MD;  Location: [facility] OR;  Service: General;  Laterality: Right;    History reviewed. No pertinent family history.  History  Substance Use Topics  . Smoking status: Current Every Day Smoker -- 0.75 packs/day    Types: Cigarettes  . Smokeless tobacco: Not on file  . Alcohol Use: No      Review of Systems  All other systems reviewed and are negative.    Allergies  Nadolol; Benicar hct; and Morphine and related  Home Medications   Current Outpatient Rx  Name  Route  Sig  Dispense  Refill  .  alprazolam (XANAX) 2 MG tablet   Oral   Take 2 mg by mouth 3 (three) times daily as needed. For anxiety         . amLODipine (NORVASC) 10 MG tablet   Oral   Take 10 mg by mouth daily.         . diclofenac sodium (VOLTAREN) 1 % GEL   Topical   Apply 4 g topically 4 (four) times daily.         Marland Kitchen ibuprofen (ADVIL,MOTRIN) 200 MG tablet   Oral   Take 400 mg by mouth every 6 (six) hours as needed for pain.         . naproxen sodium (ANAPROX) 220 MG tablet   Oral   Take 440 mg by mouth 2 (two) times daily as needed (for pain).         Marland Kitchen oxyCODONE-acetaminophen (PERCOCET) 10-325 MG per tablet   Oral   Take 1 tablet by mouth every 6 (six) hours as needed for pain.          Marland Kitchen oxyCODONE-acetaminophen (PERCOCET) 10-325 MG per tablet   Oral   Take 1 tablet by mouth every 4 (four)  hours as needed for pain.   20 tablet   0   . promethazine (PHENERGAN) 25 MG tablet   Oral   Take 1 tablet (25 mg total) by mouth every 6 (six) hours as needed for nausea.   20 tablet   0     BP 164/102  Pulse 99  Temp(Src) 97.9 F (36.6 C) (Oral)  Resp 20  SpO2 94%  Physical Exam  Nursing note and vitals reviewed. Constitutional: He is oriented to person, place, and time. He appears well-developed and well-nourished.  HENT:  Head: Normocephalic and atraumatic.  Eyes: Conjunctivae and EOM are normal. Pupils are equal, round, and reactive to light.  Neck: Normal range of motion. Neck supple.  Cardiovascular: Normal rate, regular rhythm and normal heart sounds.   Pulmonary/Chest: Effort normal and breath sounds normal.  Abdominal: Soft. Bowel sounds are normal.  Musculoskeletal: Normal range of motion.  Neurological: He is alert and oriented to person, place, and time.  Skin: Skin is warm and dry.  Psychiatric: He has a normal mood and affect.    ED Course  Procedures (including critical care time)  Labs Reviewed - No data to display No results found.   1. Neck pain        MDM  Patient has primary care followup. Rx Percocet 10/325 #20 and Phenergan 25 mg #20         Donnetta Hutching, MD 01/24/13 6578  Donnetta Hutching, MD 02/03/13 [phone]

## 2013-03-02 ENCOUNTER — Telehealth (HOSPITAL_COMMUNITY): Payer: Self-pay | Admitting: *Deleted

## 2013-03-22 ENCOUNTER — Encounter (HOSPITAL_COMMUNITY): Payer: Self-pay | Admitting: *Deleted

## 2013-03-22 ENCOUNTER — Emergency Department (HOSPITAL_COMMUNITY)
Admission: EM | Admit: 2013-03-22 | Discharge: 2013-03-22 | Disposition: A | Discharge status: Home / Self Care | Payer: Medicare Other | Attending: Emergency Medicine | Admitting: Emergency Medicine

## 2013-03-22 DIAGNOSIS — Z885 Allergy status to narcotic agent status: Secondary | ICD-10-CM | POA: Insufficient documentation

## 2013-03-22 DIAGNOSIS — R238 Other skin changes: Secondary | ICD-10-CM | POA: Insufficient documentation

## 2013-03-22 DIAGNOSIS — Z79899 Other long term (current) drug therapy: Secondary | ICD-10-CM | POA: Insufficient documentation

## 2013-03-22 DIAGNOSIS — I2510 Atherosclerotic heart disease of native coronary artery without angina pectoris: Secondary | ICD-10-CM | POA: Insufficient documentation

## 2013-03-22 DIAGNOSIS — G8929 Other chronic pain: Secondary | ICD-10-CM | POA: Insufficient documentation

## 2013-03-22 DIAGNOSIS — F17200 Nicotine dependence, unspecified, uncomplicated: Secondary | ICD-10-CM | POA: Insufficient documentation

## 2013-03-22 DIAGNOSIS — I739 Peripheral vascular disease, unspecified: Secondary | ICD-10-CM | POA: Insufficient documentation

## 2013-03-22 DIAGNOSIS — M549 Dorsalgia, unspecified: Secondary | ICD-10-CM | POA: Insufficient documentation

## 2013-03-22 DIAGNOSIS — M129 Arthropathy, unspecified: Secondary | ICD-10-CM | POA: Insufficient documentation

## 2013-03-22 DIAGNOSIS — F411 Generalized anxiety disorder: Secondary | ICD-10-CM | POA: Insufficient documentation

## 2013-03-22 DIAGNOSIS — Z888 Allergy status to other drugs, medicaments and biological substances status: Secondary | ICD-10-CM | POA: Insufficient documentation

## 2013-03-22 DIAGNOSIS — I10 Essential (primary) hypertension: Secondary | ICD-10-CM | POA: Insufficient documentation

## 2013-03-22 DIAGNOSIS — Z951 Presence of aortocoronary bypass graft: Secondary | ICD-10-CM | POA: Insufficient documentation

## 2013-03-22 MED ORDER — OXYCODONE HCL 15 MG PO TABS: 15.0000 mg | ORAL_TABLET | Freq: Four times a day (QID) | ORAL | Status: DC | PRN

## 2013-03-22 NOTE — ED Notes (Signed)
Pt is going into withdrawal from running out of pain medication.  Last use of oxycodone 10mg  was yesterday morning and is no longer working anymore.  PT is requesting need pain control but cannot decide if he wants detox from narcotics

## 2013-03-22 NOTE — ED Provider Notes (Signed)
Medical screening examination/treatment/procedure(s) were performed by non-physician practitioner and as supervising physician I was immediately available for consultation/collaboration.   Gilda Crease, MD 03/22/13 1929

## 2013-03-22 NOTE — ED Provider Notes (Signed)
History    CSN: [id number] Arrival date & time 03/22/13  1628  First MD Initiated Contact with Patient 03/22/13 1758     Chief Complaint  Patient presents with  . Medication Refill   (Consider location/radiation/quality/duration/timing/severity/associated sxs/prior Treatment) HPI Patient presents emergency Department with complaints of chronic pain and that his pain medications are no longer working. patient states, that he does not want to be on pain medications, but is unsure if he wants to go to detox.patient states that he came here to see about getting detox versus other pain medication.  Patient, states the Percocet, 10 mg is no longer helping his pain.  Patient denies chest pain, shortness of breath, hallucinations, nausea, vomiting, diarrhea, headache, blurred vision, weakness, numbness, dizziness, or syncope.  Patient, states, that he would like a referral to pain management, but does not just give pain medications Past Medical History  Diagnosis Date  . Hypertension   . Back pain   . Chills   . Bruises easily   . Arthritis pain   . Inguinal hernia     right  . Coronary artery disease     with stenting in 2004, sees Dr Allyson Sabal  . Arthritis   . Anxiety   . Inguinal hernia     Right  . Peripheral vascular disease    Past Surgical History  Procedure Laterality Date  . Orthopedic surgery    . Stents in leg      right  . Heart stents    . Fracture surgery      left foot  . Cardiac catheterization  2004    with stents x2  . Knee arthroscopy      Right  . Inguinal hernia repair  01/10/2012    Procedure: HERNIA REPAIR INGUINAL ADULT;  Surgeon: Cherylynn Ridges, MD;  Location: [facility] OR;  Service: General;  Laterality: Right;   No family history on file. History  Substance Use Topics  . Smoking status: Current Every Day Smoker -- 0.75 packs/day    Types: Cigarettes  . Smokeless tobacco: Not on file  . Alcohol Use: No    Review of Systems All other systems negative  except as documented in the HPI. All pertinent positives and negatives as reviewed in the HPI.  Allergies  Nadolol; Benicar hct; and Morphine and related  Home Medications   Current Outpatient Rx  Name  Route  Sig  Dispense  Refill  . alprazolam (XANAX) 2 MG tablet   Oral   Take 2 mg by mouth 3 (three) times daily as needed. For anxiety         . diclofenac sodium (VOLTAREN) 1 % GEL   Topical   Apply 4 g topically 4 (four) times daily.         Marland Kitchen ibuprofen (ADVIL,MOTRIN) 200 MG tablet   Oral   Take 400 mg by mouth every 6 (six) hours as needed for pain.         Marland Kitchen oxyCODONE-acetaminophen (PERCOCET) 10-325 MG per tablet   Oral   Take 1 tablet by mouth every 4 (four) hours as needed for pain.          BP 159/94  Pulse 62  Temp(Src) 97.5 F (36.4 C) (Oral)  Resp 18  Ht 6' (1.829 m)  Wt 170 lb (77.111 kg)  BMI 23.05 kg/m2  SpO2 98% Physical Exam  Nursing note and vitals reviewed. Constitutional: He is oriented to person, place, and time. He appears well-developed and well-nourished.  No distress.  HENT:  Head: Normocephalic and atraumatic.  Eyes: Pupils are equal, round, and reactive to light.  Neck: Normal range of motion. Neck supple.  Cardiovascular: Normal rate and regular rhythm.   Pulmonary/Chest: Effort normal.  Neurological: He is alert and oriented to person, place, and time.  Skin: Skin is warm and dry. No rash noted.  Psychiatric: He has a normal mood and affect. His behavior is normal. Judgment and thought content normal.    ED Course  Procedures (including critical care time) Patient, states, that he does not want detox, but once different pain control, and referral to pain management.  Patient, states, that he went to several pain management clinics, but did not offer any therapy other than pain medicines.  Patient is advised that we cannot give him large quantities of medicines.  MDM    Carlyle Dolly, PA-C 03/22/13 1915

## 2013-03-22 NOTE — ED Notes (Signed)
States received letter from Dr. Maxwell Caul office stating that they could not give any more narcotic prescriptions as of 6/24. Had been going to [facility] clinic, but changed to go to only one dr. Has run out of pain meds yesterday morning (Oxycodone 10/325) has been taking for 5 years. Hx of bulging discs, and stent placed in left leg-- also used to take BP meds-- but states quit taking it- it affected stomach, made dizzy,

## 2013-03-29 ENCOUNTER — Ambulatory Visit: Payer: Medicare Other | Admitting: Sports Medicine

## 2013-04-27 ENCOUNTER — Emergency Department (HOSPITAL_COMMUNITY)
Admission: EM | Admit: 2013-04-27 | Discharge: 2013-04-28 | Discharge status: Against Medical Advice | Payer: Medicare Other | Attending: Emergency Medicine | Admitting: Emergency Medicine

## 2013-04-27 ENCOUNTER — Encounter (HOSPITAL_COMMUNITY): Payer: Self-pay | Admitting: *Deleted

## 2013-04-27 DIAGNOSIS — Z8679 Personal history of other diseases of the circulatory system: Secondary | ICD-10-CM | POA: Insufficient documentation

## 2013-04-27 DIAGNOSIS — I10 Essential (primary) hypertension: Secondary | ICD-10-CM | POA: Insufficient documentation

## 2013-04-27 DIAGNOSIS — Z79899 Other long term (current) drug therapy: Secondary | ICD-10-CM | POA: Insufficient documentation

## 2013-04-27 DIAGNOSIS — Z8719 Personal history of other diseases of the digestive system: Secondary | ICD-10-CM | POA: Insufficient documentation

## 2013-04-27 DIAGNOSIS — M129 Arthropathy, unspecified: Secondary | ICD-10-CM | POA: Insufficient documentation

## 2013-04-27 DIAGNOSIS — F1110 Opioid abuse, uncomplicated: Secondary | ICD-10-CM

## 2013-04-27 DIAGNOSIS — I2510 Atherosclerotic heart disease of native coronary artery without angina pectoris: Secondary | ICD-10-CM | POA: Insufficient documentation

## 2013-04-27 DIAGNOSIS — F411 Generalized anxiety disorder: Secondary | ICD-10-CM | POA: Insufficient documentation

## 2013-04-27 DIAGNOSIS — F17200 Nicotine dependence, unspecified, uncomplicated: Secondary | ICD-10-CM | POA: Insufficient documentation

## 2013-04-27 LAB — COMPREHENSIVE METABOLIC PANEL
Albumin: 4 g/dL (ref 3.5–5.2)
BUN: 8 mg/dL (ref 6–23)
Calcium: 9.8 mg/dL (ref 8.4–10.5)
Creatinine, Ser: 0.76 mg/dL (ref 0.50–1.35)
GFR calc Af Amer: >90 mL/min (ref >90)
Glucose, Bld: 93 mg/dL (ref 70–99)
Total Protein: 7 g/dL (ref 6.0–8.3)

## 2013-04-27 LAB — RAPID URINE DRUG SCREEN, HOSP PERFORMED
Benzodiazepines: POSITIVE — AB
Cocaine: NOT DETECTED

## 2013-04-27 LAB — CBC
HCT: 43.1 % (ref 39.0–52.0)
MCH: 35.9 pg — ABNORMAL HIGH (ref 26.0–34.0)
MCHC: 35.7 g/dL (ref 30.0–36.0)
MCV: 100.5 fL — ABNORMAL HIGH (ref 78.0–100.0)
RDW: 12.3 % (ref 11.5–15.5)

## 2013-04-27 LAB — ACETAMINOPHEN LEVEL: Acetaminophen (Tylenol), Serum: <15 ug/mL (ref 10–30)

## 2013-04-27 LAB — SALICYLATE LEVEL: Salicylate Lvl: <2 mg/dL — ABNORMAL LOW (ref 2.8–20.0)

## 2013-04-27 LAB — ETHANOL: Alcohol, Ethyl (B): <11 mg/dL (ref 0–11)

## 2013-04-27 MED ORDER — DICYCLOMINE HCL 20 MG PO TABS
20.0000 mg | ORAL_TABLET | Freq: Four times a day (QID) | ORAL | Status: DC | PRN
  Administered 2013-04-28: 20 mg via ORAL
  Filled 2013-04-27: qty 1

## 2013-04-27 MED ORDER — NAPROXEN 250 MG PO TABS: 500.0000 mg | ORAL_TABLET | Freq: Two times a day (BID) | ORAL | Status: DC | PRN

## 2013-04-27 MED ORDER — METHOCARBAMOL 500 MG PO TABS: 500.0000 mg | ORAL_TABLET | Freq: Three times a day (TID) | ORAL | Status: DC | PRN

## 2013-04-27 MED ORDER — IBUPROFEN 800 MG PO TABS
800.0000 mg | ORAL_TABLET | Freq: Three times a day (TID) | ORAL | Status: DC | PRN
  Administered 2013-04-27: 800 mg via ORAL
  Filled 2013-04-27: qty 1

## 2013-04-27 MED ORDER — LOPERAMIDE HCL 2 MG PO CAPS: 2.0000 mg | ORAL_CAPSULE | ORAL | Status: DC | PRN

## 2013-04-27 MED ORDER — NICOTINE 21 MG/24HR TD PT24
21.0000 mg | MEDICATED_PATCH | Freq: Once | TRANSDERMAL | Status: DC
  Administered 2013-04-27: 21 mg via TRANSDERMAL
  Filled 2013-04-27: qty 1

## 2013-04-27 MED ORDER — ACETAMINOPHEN 325 MG PO TABS: 650.0000 mg | ORAL_TABLET | ORAL | Status: DC | PRN

## 2013-04-27 MED ORDER — ONDANSETRON 4 MG PO TBDP: 4.0000 mg | ORAL_TABLET | Freq: Four times a day (QID) | ORAL | Status: DC | PRN

## 2013-04-27 MED ORDER — HYDROXYZINE HCL 25 MG PO TABS: 25.0000 mg | ORAL_TABLET | Freq: Four times a day (QID) | ORAL | Status: DC | PRN

## 2013-04-27 MED ORDER — LORAZEPAM 1 MG PO TABS: 1.0000 mg | ORAL_TABLET | Freq: Once | ORAL | Status: DC

## 2013-04-27 NOTE — ED Notes (Signed)
Pt has been taking Percocet for back and neck pain.  Pt is wanting to detox off the narcotics and start something else.  NO SI/HI

## 2013-04-27 NOTE — ED Provider Notes (Signed)
CSN: [id number]     Arrival date & time 04/27/13  1335 History     First MD Initiated Contact with Patient 04/27/13 1745     Chief Complaint  Patient presents with  . Medical Clearance   HPI Pt has been taking Percocet for back and neck pain. Pt is wanting to detox off the narcotics and start something else. NO SI/HI  Past Medical History  Diagnosis Date  . Hypertension   . Back pain   . Chills   . Bruises easily   . Arthritis pain   . Inguinal hernia     right  . Coronary artery disease     with stenting in 2004, sees Dr Allyson Sabal  . Arthritis   . Anxiety   . Inguinal hernia     Right  . Peripheral vascular disease    Past Surgical History  Procedure Laterality Date  . Orthopedic surgery    . Stents in leg      right  . Heart stents    . Fracture surgery      left foot  . Cardiac catheterization  2004    with stents x2  . Knee arthroscopy      Right  . Inguinal hernia repair  01/10/2012    Procedure: HERNIA REPAIR INGUINAL ADULT;  Surgeon: Cherylynn Ridges, MD;  Location: [facility] OR;  Service: General;  Laterality: Right;   No family history on file. History  Substance Use Topics  . Smoking status: Current Every Day Smoker -- 0.75 packs/day    Types: Cigarettes  . Smokeless tobacco: Not on file  . Alcohol Use: No    Review of Systems All other systems reviewed and are negative Allergies  Nadolol; Benicar hct; and Morphine and related  Home Medications   Current Outpatient Rx  Name  Route  Sig  Dispense  Refill  . alprazolam (XANAX) 2 MG tablet   Oral   Take 2 mg by mouth 3 (three) times daily as needed. For anxiety         . amLODipine (NORVASC) 10 MG tablet   Oral   Take 5 mg by mouth daily.          Marland Kitchen ibuprofen (ADVIL,MOTRIN) 200 MG tablet   Oral   Take 400 mg by mouth every 6 (six) hours as needed for pain.         Marland Kitchen oxyCODONE (ROXICODONE) 15 MG immediate release tablet   Oral   Take 1 tablet (15 mg total) by mouth every 6 (six) hours as needed  for pain.   12 tablet   0   . oxyCODONE-acetaminophen (PERCOCET) 10-325 MG per tablet   Oral   Take 1 tablet by mouth every 4 (four) hours as needed for pain.          BP 146/68  Pulse 76  Temp(Src) 98.7 F (37.1 C) (Oral)  Resp 18  SpO2 98% Physical Exam  Nursing note and vitals reviewed. Constitutional: He is oriented to person, place, and time. He appears well-developed and well-nourished. No distress.  HENT:  Head: Normocephalic and atraumatic.  Eyes: Pupils are equal, round, and reactive to light.  Neck: Normal range of motion.  Cardiovascular: Normal rate and intact distal pulses.   Pulmonary/Chest: No respiratory distress.  Abdominal: Normal appearance. He exhibits no distension.  Musculoskeletal: Normal range of motion.  Neurological: He is alert and oriented to person, place, and time. No cranial nerve deficit.  Skin: Skin is warm and  dry. No rash noted.  Psychiatric: He has a normal mood and affect. His behavior is normal. Judgment normal. Thought content is not delusional. Cognition and memory are normal. He expresses no homicidal and no suicidal ideation.    ED Course   Procedures (including critical care time)  Labs Reviewed  CBC - Abnormal; Notable for the following:    WBC 15.0 (*)    MCV 100.5 (*)    MCH 35.9 (*)    All other components within normal limits  SALICYLATE LEVEL - Abnormal; Notable for the following:    Salicylate Lvl <2.0 (*)    All other components within normal limits  URINE RAPID DRUG SCREEN (HOSP PERFORMED) - Abnormal; Notable for the following:    Benzodiazepines POSITIVE (*)    All other components within normal limits  ACETAMINOPHEN LEVEL  COMPREHENSIVE METABOLIC PANEL  ETHANOL   No results found. 1. Opiate abuse, continuous     MDM    Nelia Shi, MD 04/29/13 1352

## 2013-04-28 MED ORDER — AMLODIPINE BESYLATE 5 MG PO TABS: 5.0000 mg | ORAL_TABLET | Freq: Every day | ORAL | Status: DC

## 2013-04-28 MED ORDER — AMLODIPINE BESYLATE 5 MG PO TABS: 5.0000 mg | ORAL_TABLET | Freq: Once | ORAL | Status: DC

## 2013-04-28 NOTE — ED Notes (Signed)
Pt complaining of pain to his neck and requesting medication for pain.

## 2013-04-28 NOTE — Progress Notes (Signed)
Cole Fuller, MHT received report from counselor Berna Spare, H who reports that patient has completed tele assessment and has been accepted to [facility] for treatment by Donell Sievert, PA pending opening on [address]. Writer completed support paperwork with patient consisting of;consent for treatment, consent to release, and utilization review sheet. Writer provided patient with education on activities while at [facility] along with program rules and expectations. Support paperwork has been completed and provided to [employer], Charity fundraiser) for transport.

## 2013-04-28 NOTE — ED Notes (Signed)
Pt back at nursing stations stating that he cannot stay, he wants to go home. Pt told about AMA process and that he will be leaving against medical advice and the risks and benefits to leaving AMA. Pt breakfast tray arrived pt eating, given belongings back and signed AMA.

## 2013-04-28 NOTE — ED Notes (Signed)
ACT team consult performed tele monitor at bedside.

## 2013-04-28 NOTE — ED Notes (Signed)
Pt complaining of pain, requesting medications for sleep, and pain. Pt states that he does not want to stay and was hoping for medications to help him through the opiate withdraw. BH NT confirmed with pt placement in [facility] facility in AM.
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
## 2013-04-28 NOTE — ED Notes (Signed)
Pt sleeping. 

## 2013-04-28 NOTE — ED Notes (Signed)
Pt denies HI or SI prior to leaving AMA

## 2013-04-28 NOTE — ED Notes (Addendum)
Pt out at nurses station requesting medication to help sleep, and help with pain and does not want to stay even though he has knowledge of BH bed inprocess. Pt states that he is having discomfort in his abdomen. Pt willing to try bentyl.

## 2013-04-28 NOTE — Progress Notes (Signed)
Cole Fuller, MHT was informed by Reeves Forth) at shift change that patient has declined treatment at [facility] and has decided to leave. Patient reports that he can do this on his own. Writer provided patient with outpatient resources psych IOP and addiction and co-occurring disorders. Notified [place] at [facility] that patient has declined placement that was pending on 300 hall.

## 2013-05-01 ENCOUNTER — Emergency Department (HOSPITAL_COMMUNITY): Payer: Medicare Other

## 2013-05-01 ENCOUNTER — Encounter (HOSPITAL_COMMUNITY): Payer: Self-pay | Admitting: Emergency Medicine

## 2013-05-01 ENCOUNTER — Emergency Department (HOSPITAL_COMMUNITY)
Admission: EM | Admit: 2013-05-01 | Discharge: 2013-05-01 | Disposition: A | Discharge status: Home / Self Care | Payer: Medicare Other | Attending: Emergency Medicine | Admitting: Emergency Medicine

## 2013-05-01 DIAGNOSIS — Z79899 Other long term (current) drug therapy: Secondary | ICD-10-CM | POA: Insufficient documentation

## 2013-05-01 DIAGNOSIS — S60219A Contusion of unspecified wrist, initial encounter: Secondary | ICD-10-CM | POA: Insufficient documentation

## 2013-05-01 DIAGNOSIS — S60212A Contusion of left wrist, initial encounter: Secondary | ICD-10-CM

## 2013-05-01 DIAGNOSIS — F17200 Nicotine dependence, unspecified, uncomplicated: Secondary | ICD-10-CM | POA: Insufficient documentation

## 2013-05-01 DIAGNOSIS — M25532 Pain in left wrist: Secondary | ICD-10-CM

## 2013-05-01 DIAGNOSIS — I10 Essential (primary) hypertension: Secondary | ICD-10-CM | POA: Insufficient documentation

## 2013-05-01 DIAGNOSIS — I2510 Atherosclerotic heart disease of native coronary artery without angina pectoris: Secondary | ICD-10-CM | POA: Insufficient documentation

## 2013-05-01 DIAGNOSIS — I739 Peripheral vascular disease, unspecified: Secondary | ICD-10-CM | POA: Insufficient documentation

## 2013-05-01 DIAGNOSIS — Z9861 Coronary angioplasty status: Secondary | ICD-10-CM | POA: Insufficient documentation

## 2013-05-01 DIAGNOSIS — Z8719 Personal history of other diseases of the digestive system: Secondary | ICD-10-CM | POA: Insufficient documentation

## 2013-05-01 DIAGNOSIS — Z23 Encounter for immunization: Secondary | ICD-10-CM | POA: Insufficient documentation

## 2013-05-01 DIAGNOSIS — Z9889 Other specified postprocedural states: Secondary | ICD-10-CM | POA: Insufficient documentation

## 2013-05-01 DIAGNOSIS — F411 Generalized anxiety disorder: Secondary | ICD-10-CM | POA: Insufficient documentation

## 2013-05-01 DIAGNOSIS — IMO0002 Reserved for concepts with insufficient information to code with codable children: Secondary | ICD-10-CM | POA: Insufficient documentation

## 2013-05-01 DIAGNOSIS — Y9389 Activity, other specified: Secondary | ICD-10-CM | POA: Insufficient documentation

## 2013-05-01 DIAGNOSIS — Y9289 Other specified places as the place of occurrence of the external cause: Secondary | ICD-10-CM | POA: Insufficient documentation

## 2013-05-01 DIAGNOSIS — M129 Arthropathy, unspecified: Secondary | ICD-10-CM | POA: Insufficient documentation

## 2013-05-01 MED ORDER — MELOXICAM 7.5 MG PO TABS: 15.0000 mg | ORAL_TABLET | Freq: Every day | ORAL | Status: DC

## 2013-05-01 MED ORDER — TETANUS-DIPHTH-ACELL PERTUSSIS 5-2.5-18.5 LF-MCG/0.5 IM SUSP
0.5000 mL | Freq: Once | INTRAMUSCULAR | Status: AC
  Administered 2013-05-01: 0.5 mL via INTRAMUSCULAR
  Filled 2013-05-01: qty 0.5

## 2013-05-01 NOTE — ED Notes (Signed)
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 Pain currently 6/10 achy sore with light purple ecchymosis. Radial +2 full sensation.

## 2013-05-01 NOTE — ED Provider Notes (Signed)
CSN: [id number]     Arrival date & time 05/01/13  1629 History  This chart was scribed for Antony Madura, non-physician practitioner working with Juliet Rude. Rubin Payor, MD by Leone Payor, ED Scribe. This patient was seen in room TR07C/TR07C and the patient's care was started at 1629.  Chief Complaint  Patient presents with  . Wrist Pain    The history is provided by the patient. No language interpreter was used.    HPI Comments: Cole Fuller is a 62 y.o. male who presents to the Emergency Department complaining of intermittent, non-radiating, unchanged left wrist pain starting 1 day ago. 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 He describes the pain as soreness. Pt has used cold compresses with swelling relief. Last tetanus is unknown. Pt denies weakness or numbness.   Past Medical History  Diagnosis Date  . Hypertension   . Back pain   . Chills   . Bruises easily   . Arthritis pain   . Inguinal hernia     right  . Coronary artery disease     with stenting in 2004, sees Dr Allyson Sabal  . Arthritis   . Anxiety   . Inguinal hernia     Right  . Peripheral vascular disease    Past Surgical History  Procedure Laterality Date  . Orthopedic surgery    . Stents in leg      right  . Heart stents    . Fracture surgery      left foot  . Cardiac catheterization  2004    with stents x2  . Knee arthroscopy      Right  . Inguinal hernia repair  01/10/2012    Procedure: HERNIA REPAIR INGUINAL ADULT;  Surgeon: Cherylynn Ridges, MD;  Location: [facility] OR;  Service: General;  Laterality: Right;   No family history on file. History  Substance Use Topics  . Smoking status: Current Every Day Smoker -- 0.75 packs/day    Types: Cigarettes  . Smokeless tobacco: Not on file  . Alcohol Use: No    Review of Systems  Musculoskeletal: Positive for arthralgias (left wrist pain).  Neurological: Negative for weakness and numbness.  All other systems  reviewed and are negative.   Allergies  Nadolol; Benicar hct; and Morphine and related  Home Medications   Current Outpatient Rx  Name  Route  Sig  Dispense  Refill  . alprazolam (XANAX) 2 MG tablet   Oral   Take 2 mg by mouth 3 (three) times daily as needed. For anxiety         . amLODipine (NORVASC) 10 MG tablet   Oral   Take 5 mg by mouth daily.          Marland Kitchen ibuprofen (ADVIL,MOTRIN) 200 MG tablet   Oral   Take 400 mg by mouth every 6 (six) hours as needed for pain.         . meloxicam (MOBIC) 7.5 MG tablet   Oral   Take 2 tablets (15 mg total) by mouth daily.   30 tablet   0    BP 166/87  Pulse 90  Temp(Src) 97.8 F (36.6 C) (Oral)  Resp 18  SpO2 97%  Physical Exam  Nursing note and vitals reviewed. Constitutional: He is oriented to person, place, and time. He appears well-developed and well-nourished. No distress.  HENT:  Head: Normocephalic and atraumatic.  Eyes: Conjunctivae and EOM are  normal. No scleral icterus.  Neck: Normal range of motion.  Cardiovascular: Normal rate, regular rhythm and intact distal pulses.   Distal radial pulses 2+ bilaterally. Capillary refill normal.  Pulmonary/Chest: Effort normal. No respiratory distress.  Musculoskeletal: Normal range of motion.       Left wrist: He exhibits tenderness, bony tenderness and swelling (Mild). He exhibits normal range of motion, no effusion, no crepitus and no deformity.  Tenderness to palpation of the distal radius. Contusion to thenar eminence and to volar aspect of L wrist.   Neurological: He is alert and oriented to person, place, and time.  Wrist exensor and flexor strength is 5/5 against resistance. Equal grip strength bilaterally. Finger to thumb opposition is intact. No sensory or motor deficits appreciated  Skin: Skin is warm and dry. No rash noted. He is not diaphoretic. No erythema. No pallor.  Has a contusion to the thenar eminence and dorsal medial aspect of left hand.   Psychiatric: He has a normal mood and affect. His behavior is normal.    ED Course   DIAGNOSTIC STUDIES: Oxygen Saturation is 97% on RA, normal by my interpretation.    COORDINATION OF CARE: 5:19 PM Discussed treatment plan which includes XRAY with pt at bedside and pt agreed to plan.    Procedures (including critical care time)   Labs Reviewed - No data to display Dg Wrist Complete Left  05/01/2013   *RADIOLOGY REPORT*  Clinical Data: Crush injury to the left hand and wrist, the patient states that a heavy object landed upon the hand and wrist.  LEFT WRIST - COMPLETE 3+ VIEW  Comparison: Left hand x-rays obtained concurrently.  Findings: Osseous demineralization.  No evidence of acute fracture or dislocation. Benign appearing circumscribed cystic focus with sclerotic margin in the lunate and in the distal ulna.  No other intrinsic osseous abnormalities.  IMPRESSION: No acute osseous abnormality.  Osseous demineralization.   Original Report Authenticated By: Hulan Saas, M.D.   Dg Hand Complete Left  05/01/2013   *RADIOLOGY REPORT*  Clinical Data: Crush injury to the left hand and wrist, the patient states that a heavy object landed upon the hand and wrist.  LEFT HAND - COMPLETE 3+ VIEW  Comparison: Left wrist x-rays obtained concurrently.  Findings: No evidence of acute fracture or dislocation.  Osseous demineralization.  Mild joint space narrowing involving the IP joints in multiple fingers, greatest involvement of the DIP joint of the small finger.  IMPRESSION: No acute osseous abnormality.  Osseous demineralization.  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   Original Report Authenticated By: Hulan Saas, M.D.   1. Wrist contusion, left, initial encounter   2. Wrist pain, acute, left    MDM  62 year old male presents for pain to his left wrist after it was hit by a bar on his lawnmower. Patient well and nontoxic appearing and hemodynamically stable. He is  afebrile and neurovascularly intact. Normal strength against resistance, reflexes, and sensation. X-ray of the left hand and wrist without evidence of fracture, dislocation, or other acute bony abnormality. There is no pallor, pulselessness, poikilothermia, or paresthesias to suspect complicating injury. Thumb spica splint ordered to be applied in the ED for stability. RICE recommended and Rx for mobic given for pain control; patient recently seen for med clearance and opioid dependence. Referral to a hand specialist given should symptoms not improve with recommended treatments. Indications for ED return provided and patient agreeable to plan.  I personally performed the services described in  this documentation, which was scribed in my presence. The recorded information has been reviewed and is accurate.    Antony Madura, PA-C 05/01/13 1831

## 2013-05-01 NOTE — ED Provider Notes (Signed)
Medical screening examination/treatment/procedure(s) were performed by non-physician practitioner and as supervising physician I was immediately available for consultation/collaboration.  Kechia Yahnke R. Tabius Rood, MD 05/01/13 2241 

## 2013-05-01 NOTE — ED Notes (Signed)
Patient transported to X-ray 

## 2013-11-03 ENCOUNTER — Encounter (HOSPITAL_BASED_OUTPATIENT_CLINIC_OR_DEPARTMENT_OTHER): Payer: Medicare Other

## 2013-11-08 ENCOUNTER — Ambulatory Visit (HOSPITAL_BASED_OUTPATIENT_CLINIC_OR_DEPARTMENT_OTHER): Payer: Medicare Other | Attending: Physical Medicine and Rehabilitation | Admitting: Radiology

## 2013-11-08 NOTE — Sleep Study (Unsigned)
THE PATIENT ARRIVED TO THE SLEEP DISORDERS CENTER IN NO NOTED DISTRESS. THE PATIENT IS SCHEDULED TO UNDERGO AN OUT OF CENTER SLEEP TEST.THE TECH DEMONSTRATED TO THE PATIENT THE APPLICATION OF THE DEVICE.THE PATIENT DEMONSTRATED CLEAR UNDERSTANDING DURING THE TEACH-BACK OF THE APPLICATION. THE PATIENT WAS INSTRUCTED TO RETURN THE DEVICE ON THE NEXT BUSINESS DAY. THE PATIENT AGREED.

## 2013-11-12 ENCOUNTER — Encounter (HOSPITAL_BASED_OUTPATIENT_CLINIC_OR_DEPARTMENT_OTHER): Payer: Medicare Other

## 2013-11-15 ENCOUNTER — Ambulatory Visit (HOSPITAL_BASED_OUTPATIENT_CLINIC_OR_DEPARTMENT_OTHER): Payer: Medicare Other | Attending: Physical Medicine and Rehabilitation | Admitting: Radiology

## 2013-11-15 DIAGNOSIS — G4733 Obstructive sleep apnea (adult) (pediatric): Secondary | ICD-10-CM

## 2013-11-15 DIAGNOSIS — R0683 Snoring: Secondary | ICD-10-CM

## 2014-01-17 ENCOUNTER — Telehealth (HOSPITAL_BASED_OUTPATIENT_CLINIC_OR_DEPARTMENT_OTHER): Payer: Self-pay | Admitting: *Deleted

## 2014-01-17 NOTE — Telephone Encounter (Signed)
Patient has not responded to three requests to return to the Sleep Disorders Center in order to re-evaluate an inconclusive home sleep test. There was an insufficient amount of data captured on 11/08/13. We stopped attempts to connect with the patient on 12/08/13.

## 2014-08-29 ENCOUNTER — Ambulatory Visit (HOSPITAL_BASED_OUTPATIENT_CLINIC_OR_DEPARTMENT_OTHER): Payer: Medicare Other | Attending: Physical Medicine and Rehabilitation | Admitting: *Deleted

## 2014-08-29 DIAGNOSIS — Z6823 Body mass index (BMI) 23.0-23.9, adult: Secondary | ICD-10-CM | POA: Diagnosis not present

## 2014-08-29 DIAGNOSIS — G4710 Hypersomnia, unspecified: Secondary | ICD-10-CM | POA: Insufficient documentation

## 2014-08-29 DIAGNOSIS — G4730 Sleep apnea, unspecified: Secondary | ICD-10-CM | POA: Insufficient documentation

## 2014-09-03 DIAGNOSIS — G4730 Sleep apnea, unspecified: Secondary | ICD-10-CM

## 2014-09-03 DIAGNOSIS — G4710 Hypersomnia, unspecified: Secondary | ICD-10-CM

## 2014-09-03 NOTE — Sleep Study (Signed)
    NAME: Cole SaxonSteven W Vickroy DATE OF BIRTH:  12/02/1950 MEDICAL RECORD NUMBER [id number]  LOCATION: [facility]  PHYSICIAN: Jevan Gaunt D  DATE OF STUDY: 08/29/2014  SLEEP STUDY TYPE: Out of Center Sleep Test                REFERRING PHYSICIAN: Verdon CumminsBodea, Marioara, MD  INDICATION FOR STUDY: Hypersomnia with sleep apnea  EPWORTH SLEEPINESS SCORE:   3/24 HEIGHT:   6 feet WEIGHT:   183 pounds  BMI 23.3 NECK SIZE:   in.  MEDICATIONS: Charted for review  IMPRESSION:  Minimal obstructive sleep apnea/hypopnea syndrome, AHI 5.7 per hour. 33 total events scored including 13 central apneas, 8 obstructive apneas, 4 mixed apneas, 8 hypopneas. Sleep position not specified. Oxygen desaturation to a nadir of 76% with mean oxygen saturation 83% on room air. Mean heart rate 82.9/m    RECOMMENDATION:  This minimal degree of sleep apnea would not usually be treated directly. Conservative measures such as management of nasal congestion and encouragement to sleep off flat of back might be appropriate.  Persistent hypoxemia was noted with average oxygen saturation 83%, suggesting underlying cardiopulmonary disease.  [name],[name] D Diplomate, American Board of Sleep Medicine  ELECTRONICALLY SIGNED ON:  09/03/2014, 4:47 PM  SLEEP DISORDERS CENTER PH: [phone]   FX: [phone] ACCREDITED BY THE AMERICAN ACADEMY OF SLEEP MEDICINE

## 2014-11-14 ENCOUNTER — Other Ambulatory Visit: Payer: Self-pay | Admitting: Physical Medicine and Rehabilitation

## 2014-11-14 DIAGNOSIS — M545 Low back pain: Secondary | ICD-10-CM

## 2014-12-03 ENCOUNTER — Inpatient Hospital Stay: Admission: RE | Admit: 2014-12-03 | Payer: Medicare Other | Source: Ambulatory Visit

## 2014-12-12 ENCOUNTER — Ambulatory Visit
Admission: RE | Admit: 2014-12-12 | Discharge: 2014-12-12 | Disposition: A | Discharge status: Home / Self Care | Payer: Medicare Other | Source: Ambulatory Visit | Attending: Physical Medicine and Rehabilitation | Admitting: Physical Medicine and Rehabilitation

## 2014-12-12 DIAGNOSIS — M545 Low back pain: Secondary | ICD-10-CM

## 2015-07-24 ENCOUNTER — Encounter: Payer: Self-pay | Admitting: Vascular Surgery

## 2015-07-26 ENCOUNTER — Encounter: Payer: Medicare Other | Admitting: Vascular Surgery

## 2015-08-18 ENCOUNTER — Encounter: Payer: Self-pay | Admitting: Vascular Surgery

## 2015-08-24 ENCOUNTER — Encounter: Payer: Self-pay | Admitting: Vascular Surgery

## 2015-08-24 ENCOUNTER — Ambulatory Visit (INDEPENDENT_AMBULATORY_CARE_PROVIDER_SITE_OTHER): Payer: Medicare Other | Admitting: Vascular Surgery

## 2015-08-24 VITALS — BP 140/84 | HR 78 | Ht 72.0 in | Wt 176.0 lb

## 2015-08-24 DIAGNOSIS — F17210 Nicotine dependence, cigarettes, uncomplicated: Secondary | ICD-10-CM | POA: Diagnosis not present

## 2015-08-24 DIAGNOSIS — I739 Peripheral vascular disease, unspecified: Secondary | ICD-10-CM | POA: Diagnosis not present

## 2015-08-24 NOTE — Progress Notes (Signed)
[facility] [place] HISTORY AND PHYSICAL   History of Present Illness:  Patient is a 64 y.o. year old male who presents for evaluation of bilateral leg pain. The patient complains primarily of pain in his right calf that occurs after walking 50 yards. This is relieved by rest. He also complains of some numbness and tingling on his right anterior thigh but this can occur with sitting standing or spontaneously. He denies rest pain in the feet. He does have some pain in the left foot but had a crush injury to this with multiple orthopedic procedures in the past. He does have some claudication type symptoms in the left leg but not as bad as the right. According to the patient's history he had a right superficial femoral artery stent placed by Dr. Allyson SabalBerry in 2004. Records of this were not available for review today. He also had a coronary stent placed in 2004 by Dr. Allyson SabalBerry.  Other medical problems include hypertension, chronic back pain, arthritis all of which are currently stable. He does have a history of coronary disease but denies any chest pain or shortness of breath currently. He does currently smoke and he was counseled against this for greater than 3 minutes today.  Past Medical History  Diagnosis Date  . Hypertension   . Back pain   . Chills   . Bruises easily   . Arthritis pain   . Inguinal hernia     right  . Coronary artery disease     with stenting in 2004, sees Dr Allyson SabalBerry  . Arthritis   . Anxiety   . Inguinal hernia     Right  . Peripheral vascular disease [facility](HCC)     Past Surgical History  Procedure Laterality Date  . Orthopedic surgery    . Stents in leg      right  . Heart stents    . Fracture surgery      left foot  . Cardiac catheterization  2004    with stents x2  . Knee arthroscopy      Right  . Inguinal hernia repair  01/10/2012    Procedure: HERNIA REPAIR INGUINAL ADULT;  Surgeon: Cherylynn RidgesJames O Wyatt, MD;  [facility];  Service: General;  Laterality:  Right;    Social History Social History  Substance Use Topics  . Smoking status: Current Every Day Smoker -- 0.75 packs/day    Types: Cigarettes  . Smokeless tobacco: None  . Alcohol Use: No    Family History History reviewed. No pertinent family history.  Allergies  Allergies  Allergen Reactions  . Nadolol Shortness Of Breath    Shortness of breath  . Benicar Hct [Olmesartan Medoxomil-Hctz] Other (See Comments)    Shortness of breath  . Morphine And Related Hives and Itching     Current Outpatient Prescriptions  Medication Sig Dispense Refill  . alprazolam (XANAX) 2 MG tablet Take 2 mg by mouth 3 (three) times daily as needed. For anxiety    . amLODipine (NORVASC) 10 MG tablet Take 5 mg by mouth daily.     Marland Kitchen. BUTRANS 20 MCG/HR PTWK patch APP 1 PATCH ONTO THE SKIN ONCE A WEEK  0  . ibuprofen (ADVIL,MOTRIN) 200 MG tablet Take 400 mg by mouth every 6 (six) hours as needed for pain.    Marland Kitchen. ONDANSETRON PO Take by mouth.    Marland Kitchen. tiZANidine (ZANAFLEX) 2 MG tablet TK 2 TS PO TID PRN  2  . meloxicam (MOBIC) 7.5 MG  tablet Take 2 tablets (15 mg total) by mouth daily. (Patient not taking: Reported on 08/24/2015) 30 tablet 0   No current facility-administered medications for this visit.    ROS:   General:  No weight loss, Fever, chills  HEENT: No recent headaches, no nasal bleeding, no visual changes, no sore throat  Neurologic: No dizziness, blackouts, seizures. No recent symptoms of stroke or mini- stroke. No recent episodes of slurred speech, or temporary blindness.  Cardiac: No recent episodes of chest pain/pressure, no shortness of breath at rest.  No shortness of breath with exertion.  Denies history of atrial fibrillation or irregular heartbeat  Vascular: No history of rest pain in feet.  No history of claudication.  No history of non-healing ulcer, No history of DVT   Pulmonary: No home oxygen, no productive cough, no hemoptysis,  No asthma or wheezing  Musculoskeletal:  [  ] Arthritis,  Low back pain,   Joint pain  Hematologic:No history of hypercoagulable state.  No history of easy bleeding.  No history of anemia  Gastrointestinal: No hematochezia or melena,  No gastroesophageal reflux, no trouble swallowing  Urinary:  chronic Kidney disease,  on HD -  MWF or  TTHS,  Burning with urination,  Frequent urination,  Difficulty urinating;   Skin: No rashes  Psychological: + history of anxiety,  No history of depression   Physical Examination  Filed Vitals:   08/24/15 0911  BP: 140/84  Pulse: 78  Height: 6' (1.829 m)  Weight: 176 lb (79.833 kg)  SpO2: 98%    Body mass index is 23.86 kg/(m^2).  General:  Alert and oriented, no acute distress HEENT: Normal Neck: No bruit or JVD Pulmonary: Clear to auscultation bilaterally Cardiac: Regular Rate and Rhythm without murmur Abdomen: Soft, non-tender, non-distended, no mass Skin: No rash Extremity Pulses:  2+ radial, brachial, femoral, absent popliteal dorsalis pedis, posterior tibial pulses bilaterally Musculoskeletal: No deformity or edema  Neurologic: Upper and lower extremity motor 5/5 and symmetric  DATA:  Patient had bilateral lower extremity ABIs performed at [facility] on 07/04/2015 which showed ABI of 0.46 bilaterally. This suggested right superficial femoral artery occlusion as well as left iliac stenosis   ASSESSMENT:  Lifestyle limiting claudication   PLAN:  #1 smoking cessation was emphasized to reduce coronary artery disease improve durability of any peripheral intervention. Patient was scheduled for aortogram bilateral extremity runoff possible intervention on 09/15/2015. Risks benefits possible complications and procedure details were explained the patient today including but not limited to bleeding infection vessel injury need for further procedures contrast reaction.  Fabienne Bruns, MD [facility] [place] Office:  [phone] Pager: [phone]

## 2015-08-25 ENCOUNTER — Encounter: Payer: Self-pay | Admitting: Cardiology

## 2015-08-25 ENCOUNTER — Other Ambulatory Visit: Payer: Self-pay

## 2015-09-15 ENCOUNTER — Other Ambulatory Visit: Payer: Self-pay | Admitting: *Deleted

## 2015-09-15 ENCOUNTER — Encounter (HOSPITAL_COMMUNITY)
Admission: RE | Disposition: A | Discharge status: Home / Self Care | Payer: Self-pay | Source: Ambulatory Visit | Attending: Vascular Surgery

## 2015-09-15 ENCOUNTER — Ambulatory Visit (HOSPITAL_COMMUNITY)
Admission: RE | Admit: 2015-09-15 | Discharge: 2015-09-15 | Disposition: A | Discharge status: Home / Self Care | Payer: Medicare Other | Source: Ambulatory Visit | Attending: Vascular Surgery | Admitting: Vascular Surgery

## 2015-09-15 DIAGNOSIS — I70213 Atherosclerosis of native arteries of extremities with intermittent claudication, bilateral legs: Secondary | ICD-10-CM | POA: Insufficient documentation

## 2015-09-15 DIAGNOSIS — I10 Essential (primary) hypertension: Secondary | ICD-10-CM | POA: Insufficient documentation

## 2015-09-15 DIAGNOSIS — I2510 Atherosclerotic heart disease of native coronary artery without angina pectoris: Secondary | ICD-10-CM | POA: Insufficient documentation

## 2015-09-15 DIAGNOSIS — Y812 Prosthetic and other implants, materials and accessory general- and plastic-surgery devices associated with adverse incidents: Secondary | ICD-10-CM | POA: Diagnosis not present

## 2015-09-15 DIAGNOSIS — M1990 Unspecified osteoarthritis, unspecified site: Secondary | ICD-10-CM | POA: Diagnosis not present

## 2015-09-15 DIAGNOSIS — F17210 Nicotine dependence, cigarettes, uncomplicated: Secondary | ICD-10-CM | POA: Insufficient documentation

## 2015-09-15 DIAGNOSIS — T82856A Stenosis of peripheral vascular stent, initial encounter: Secondary | ICD-10-CM | POA: Insufficient documentation

## 2015-09-15 DIAGNOSIS — Z885 Allergy status to narcotic agent status: Secondary | ICD-10-CM | POA: Diagnosis not present

## 2015-09-15 DIAGNOSIS — I739 Peripheral vascular disease, unspecified: Secondary | ICD-10-CM

## 2015-09-15 DIAGNOSIS — F419 Anxiety disorder, unspecified: Secondary | ICD-10-CM | POA: Diagnosis not present

## 2015-09-15 DIAGNOSIS — Z955 Presence of coronary angioplasty implant and graft: Secondary | ICD-10-CM | POA: Diagnosis not present

## 2015-09-15 HISTORY — PX: PERIPHERAL VASCULAR CATHETERIZATION: SHX172C

## 2015-09-15 LAB — POCT I-STAT, CHEM 8
BUN: 6 mg/dL (ref 6–20)
CALCIUM ION: 1.28 mmol/L (ref 1.13–1.30)
CREATININE: 0.8 mg/dL (ref 0.61–1.24)
Chloride: 103 mmol/L (ref 101–111)
GLUCOSE: 91 mg/dL (ref 65–99)
HCT: 49 % (ref 39.0–52.0)
HEMOGLOBIN: 16.7 g/dL (ref 13.0–17.0)
Potassium: 3.8 mmol/L (ref 3.5–5.1)
Sodium: 144 mmol/L (ref 135–145)
TCO2: 28 mmol/L (ref 0–100)

## 2015-09-15 SURGERY — ABDOMINAL AORTOGRAM
Anesthesia: LOCAL

## 2015-09-15 MED ORDER — ACETAMINOPHEN 325 MG RE SUPP: 325.0000 mg | RECTAL | Status: DC | PRN

## 2015-09-15 MED ORDER — OXYCODONE HCL 5 MG PO TABS: 5.0000 mg | ORAL_TABLET | ORAL | Status: DC | PRN

## 2015-09-15 MED ORDER — SODIUM CHLORIDE 0.9 % IV SOLN
INTRAVENOUS | Status: DC
  Administered 2015-09-15: 10:00:00 via INTRAVENOUS

## 2015-09-15 MED ORDER — HYDROMORPHONE HCL 1 MG/ML IJ SOLN: 0.5000 mg | INTRAMUSCULAR | Status: DC | PRN

## 2015-09-15 MED ORDER — ACETAMINOPHEN 325 MG PO TABS: 325.0000 mg | ORAL_TABLET | ORAL | Status: DC | PRN

## 2015-09-15 MED ORDER — HYDRALAZINE HCL 20 MG/ML IJ SOLN: 5.0000 mg | INTRAMUSCULAR | Status: DC | PRN

## 2015-09-15 MED ORDER — HEPARIN (PORCINE) IN NACL 2-0.9 UNIT/ML-% IJ SOLN
INTRAMUSCULAR | Status: AC
  Filled 2015-09-15: qty 1000

## 2015-09-15 MED ORDER — METOPROLOL TARTRATE 1 MG/ML IV SOLN: 2.0000 mg | INTRAVENOUS | Status: DC | PRN

## 2015-09-15 MED ORDER — LIDOCAINE HCL (PF) 1 % IJ SOLN
INTRAMUSCULAR | Status: AC
  Filled 2015-09-15: qty 30

## 2015-09-15 MED ORDER — LIDOCAINE HCL (PF) 1 % IJ SOLN
INTRAMUSCULAR | Status: DC | PRN
  Administered 2015-09-15: 14 mL

## 2015-09-15 MED ORDER — ONDANSETRON HCL 4 MG/2ML IJ SOLN: 4.0000 mg | Freq: Four times a day (QID) | INTRAMUSCULAR | Status: DC | PRN

## 2015-09-15 MED ORDER — SODIUM CHLORIDE 0.45 % IV SOLN
INTRAVENOUS | Status: DC
  Administered 2015-09-15: 13:00:00 via INTRAVENOUS

## 2015-09-15 SURGICAL SUPPLY — 9 items
CATH ANGIO 5F PIGTAIL 65CM (CATHETERS) ×2 IMPLANT
COVER PRB 48X5XTLSCP FOLD TPE (BAG) ×1 IMPLANT
COVER PROBE 5X48 (BAG) ×1
KIT PV (KITS) ×2 IMPLANT
SHEATH PINNACLE 5F 10CM (SHEATH) ×2 IMPLANT
SYR MEDRAD MARK V 150ML (SYRINGE) ×2 IMPLANT
TRANSDUCER W/STOPCOCK (MISCELLANEOUS) ×2 IMPLANT
TRAY PV CATH (CUSTOM PROCEDURE TRAY) ×2 IMPLANT
WIRE HI TORQ VERSACORE-J 145CM (WIRE) ×2 IMPLANT

## 2015-09-15 NOTE — H&P (View-Only) (Signed)
[facility] [place] HISTORY AND PHYSICAL   History of Present Illness:  Patient is a 65 y.o. year old male who presents for evaluation of bilateral leg pain. The patient complains primarily of pain in his right calf that occurs after walking 50 yards. This is relieved by rest. He also complains of some numbness and tingling on his right anterior thigh but this can occur with sitting standing or spontaneously. He denies rest pain in the feet. He does have some pain in the left foot but had a crush injury to this with multiple orthopedic procedures in the past. He does have some claudication type symptoms in the left leg but not as bad as the right. According to the patient's history he had a right superficial femoral artery stent placed by Dr. Allyson SabalBerry in 2004. Records of this were not available for review today. He also had a coronary stent placed in 2004 by Dr. Allyson SabalBerry.  Other medical problems include hypertension, chronic back pain, arthritis all of which are currently stable. He does have a history of coronary disease but denies any chest pain or shortness of breath currently. He does currently smoke and he was counseled against this for greater than 3 minutes today.  Past Medical History  Diagnosis Date  . Hypertension   . Back pain   . Chills   . Bruises easily   . Arthritis pain   . Inguinal hernia     right  . Coronary artery disease     with stenting in 2004, sees Dr Allyson SabalBerry  . Arthritis   . Anxiety   . Inguinal hernia     Right  . Peripheral vascular disease [facility](HCC)     Past Surgical History  Procedure Laterality Date  . Orthopedic surgery    . Stents in leg      right  . Heart stents    . Fracture surgery      left foot  . Cardiac catheterization  2004    with stents x2  . Knee arthroscopy      Right  . Inguinal hernia repair  01/10/2012    Procedure: HERNIA REPAIR INGUINAL ADULT;  Surgeon: Cherylynn RidgesJames O Wyatt, MD;  [facility];  Service: General;  Laterality:  Right;    Social History Social History  Substance Use Topics  . Smoking status: Current Every Day Smoker -- 0.75 packs/day    Types: Cigarettes  . Smokeless tobacco: None  . Alcohol Use: No    Family History History reviewed. No pertinent family history.  Allergies  Allergies  Allergen Reactions  . Nadolol Shortness Of Breath    Shortness of breath  . Benicar Hct [Olmesartan Medoxomil-Hctz] Other (See Comments)    Shortness of breath  . Morphine And Related Hives and Itching     Current Outpatient Prescriptions  Medication Sig Dispense Refill  . alprazolam (XANAX) 2 MG tablet Take 2 mg by mouth 3 (three) times daily as needed. For anxiety    . amLODipine (NORVASC) 10 MG tablet Take 5 mg by mouth daily.     Marland Kitchen. BUTRANS 20 MCG/HR PTWK patch APP 1 PATCH ONTO THE SKIN ONCE A WEEK  0  . ibuprofen (ADVIL,MOTRIN) 200 MG tablet Take 400 mg by mouth every 6 (six) hours as needed for pain.    Marland Kitchen. ONDANSETRON PO Take by mouth.    Marland Kitchen. tiZANidine (ZANAFLEX) 2 MG tablet TK 2 TS PO TID PRN  2  . meloxicam (MOBIC) 7.5 MG  tablet Take 2 tablets (15 mg total) by mouth daily. (Patient not taking: Reported on 08/24/2015) 30 tablet 0   No current facility-administered medications for this visit.    ROS:   General:  No weight loss, Fever, chills  HEENT: No recent headaches, no nasal bleeding, no visual changes, no sore throat  Neurologic: No dizziness, blackouts, seizures. No recent symptoms of stroke or mini- stroke. No recent episodes of slurred speech, or temporary blindness.  Cardiac: No recent episodes of chest pain/pressure, no shortness of breath at rest.  No shortness of breath with exertion.  Denies history of atrial fibrillation or irregular heartbeat  Vascular: No history of rest pain in feet.  No history of claudication.  No history of non-healing ulcer, No history of DVT   Pulmonary: No home oxygen, no productive cough, no hemoptysis,  No asthma or wheezing  Musculoskeletal:  [  ] Arthritis, [ ]  Low back pain,  [ ]  Joint pain  Hematologic:No history of hypercoagulable state.  No history of easy bleeding.  No history of anemia  Gastrointestinal: No hematochezia or melena,  No gastroesophageal reflux, no trouble swallowing  Urinary: [ ]  chronic Kidney disease, [ ]  on HD - [ ]  MWF or [ ]  TTHS, [ ]  Burning with urination, [ ]  Frequent urination, [ ]  Difficulty urinating;   Skin: No rashes  Psychological: + history of anxiety,  No history of depression   Physical Examination  Filed Vitals:   08/24/15 0911  BP: 140/84  Pulse: 78  Height: 6' (1.829 m)  Weight: 176 lb (79.833 kg)  SpO2: 98%    Body mass index is 23.86 kg/(m^2).  General:  Alert and oriented, no acute distress HEENT: Normal Neck: No bruit or JVD Pulmonary: Clear to auscultation bilaterally Cardiac: Regular Rate and Rhythm without murmur Abdomen: Soft, non-tender, non-distended, no mass Skin: No rash Extremity Pulses:  2+ radial, brachial, femoral, absent popliteal dorsalis pedis, posterior tibial pulses bilaterally Musculoskeletal: No deformity or edema  Neurologic: Upper and lower extremity motor 5/5 and symmetric  DATA:  Patient had bilateral lower extremity ABIs performed at [facility] on 07/04/2015 which showed ABI of 0.46 bilaterally. This suggested right superficial femoral artery occlusion as well as left iliac stenosis   ASSESSMENT:  Lifestyle limiting claudication   PLAN:  #1 smoking cessation was emphasized to reduce coronary artery disease improve durability of any peripheral intervention. Patient was scheduled for aortogram bilateral extremity runoff possible intervention on 09/15/2015. Risks benefits possible complications and procedure details were explained the patient today including but not limited to bleeding infection vessel injury need for further procedures contrast reaction.  Fabienne Brunsharles Zuriel Roskos, MD [facility] [place] Office:  [phone] Pager: [phone]

## 2015-09-15 NOTE — Interval H&P Note (Signed)
History and Physical Interval Note:  09/15/2015 11:11 AM  Cole SaxonSteven W Mcdermott  has presented today for surgery, with the diagnosis of pvd with bilateral claudication  The various methods of treatment have been discussed with the patient and family. After consideration of risks, benefits and other options for treatment, the patient has consented to  Procedure(s): Abdominal Aortogram (N/A) as a surgical intervention .  The patient's history has been reviewed, patient examined, no change in status, stable for surgery.  I have reviewed the patient's chart and labs.  Questions were answered to the patient's satisfaction.     Fabienne BrunsFields, Charles

## 2015-09-15 NOTE — Discharge Instructions (Signed)
Angiogram, Care After These instructions give you information about caring for yourself after your procedure. Your doctor may also give you more specific instructions. Call your doctor if you have any problems or questions after your procedure.  HOME CARE  Take medicines only as told by your doctor.  Follow your doctor's instructions about:  Care of the area where the tube was inserted.  Bandage (dressing) changes and removal.  You may shower 24-48 hours after the procedure or as told by your doctor.  Do not take baths, swim, or use a hot tub until your doctor approves.  Every day, check the area where the tube was inserted. Watch for:  Redness, swelling, or pain.  Fluid, blood, or pus.  Do not apply powder or lotion to the site.  Do not lift anything that is heavier than 10 lb (4.5 kg) for 5 days or as told by your doctor.  Ask your doctor when you can:  Return to work or school.  Do physical activities or play sports.  Have sex.  Do not drive or operate heavy machinery for 24 hours or as told by your doctor.  Have someone with you for the first 24 hours after the procedure.  Keep all follow-up visits as told by your doctor. This is important. GET HELP IF:  You have a fever.   You have chills.   You have more bleeding from the area where the tube was inserted. Hold pressure on the area.  You have redness, swelling, or pain in the area where the tube was inserted.  You have fluid or pus coming from the area. GET HELP RIGHT AWAY IF:   You have a lot of pain in the area where the tube was inserted.  The area where the tube was inserted is bleeding, and the bleeding does not stop after 30 minutes of holding steady pressure on the area.  The area near or just beyond the insertion site becomes pale, cool, tingly, or numb.   This information is not intended to replace advice given to you by your health care provider. Make sure you discuss any questions you have  with your health care provider.   Document Released: 11/22/2008 Document Revised: 09/16/2014 Document Reviewed: 01/27/2013 Elsevier Interactive Patient Education 2016 [employer].  Smoking Cessation, Tips for Success If you are ready to quit smoking, congratulations! You have chosen to help yourself be healthier. Cigarettes bring nicotine, tar, carbon monoxide, and other irritants into your body. Your lungs, heart, and blood vessels will be able to work better without these poisons. There are many different ways to quit smoking. Nicotine gum, nicotine patches, a nicotine inhaler, or nicotine nasal spray can help with physical craving. Hypnosis, support groups, and medicines help break the habit of smoking. WHAT THINGS CAN I DO TO MAKE QUITTING EASIER?  Here are some tips to help you quit for good:  Pick a date when you will quit smoking completely. Tell all of your friends and family about your plan to quit on that date.  Do not try to slowly cut down on the number of cigarettes you are smoking. Pick a quit date and quit smoking completely starting on that day.  Throw away all cigarettes.   Clean and remove all ashtrays from your home, work, and car.  On a card, write down your reasons for quitting. Carry the card with you and read it when you get the urge to smoke.  Cleanse your body of nicotine. Drink enough  water and fluids to keep your urine clear or pale yellow. Do this after quitting to flush the nicotine from your body.  Learn to predict your moods. Do not let a bad situation be your excuse to have a cigarette. Some situations in your life might tempt you into wanting a cigarette.  Never have "just one" cigarette. It leads to wanting another and another. Remind yourself of your decision to quit.  Change habits associated with smoking. If you smoked while driving or when feeling stressed, try other activities to replace smoking. Stand up when drinking your coffee. Brush your teeth  after eating. Sit in a different chair when you read the paper. Avoid alcohol while trying to quit, and try to drink fewer caffeinated beverages. Alcohol and caffeine may urge you to smoke.  Avoid foods and drinks that can trigger a desire to smoke, such as sugary or spicy foods and alcohol.  Ask people who smoke not to smoke around you.  Have something planned to do right after eating or having a cup of coffee. For example, plan to take a walk or exercise.  Try a relaxation exercise to calm you down and decrease your stress. Remember, you may be tense and nervous for the first 2 weeks after you quit, but this will pass.  Find new activities to keep your hands busy. Play with a pen, coin, or rubber band. Doodle or draw things on paper.  Brush your teeth right after eating. This will help cut down on the craving for the taste of tobacco after meals. You can also try mouthwash.   Use oral substitutes in place of cigarettes. Try using lemon drops, carrots, cinnamon sticks, or chewing gum. Keep them handy so they are available when you have the urge to smoke.  When you have the urge to smoke, try deep breathing.  Designate your home as a nonsmoking area.  If you are a heavy smoker, ask your health care provider about a prescription for nicotine chewing gum. It can ease your withdrawal from nicotine.  Reward yourself. Set aside the cigarette money you save and buy yourself something nice.  Look for support from others. Join a support group or smoking cessation program. Ask someone at home or at work to help you with your plan to quit smoking.  Always ask yourself, "Do I need this cigarette or is this just a reflex?" Tell yourself, "Today, I choose not to smoke," or "I do not want to smoke." You are reminding yourself of your decision to quit.  Do not replace cigarette smoking with electronic cigarettes (commonly called e-cigarettes). The safety of e-cigarettes is unknown, and some may contain  harmful chemicals.  If you relapse, do not give up! Plan ahead and think about what you will do the next time you get the urge to smoke. HOW WILL I FEEL WHEN I QUIT SMOKING? You may have symptoms of withdrawal because your body is used to nicotine (the addictive substance in cigarettes). You may crave cigarettes, be irritable, feel very hungry, cough often, get headaches, or have difficulty concentrating. The withdrawal symptoms are only temporary. They are strongest when you first quit but will go away within 10-14 days. When withdrawal symptoms occur, stay in control. Think about your reasons for quitting. Remind yourself that these are signs that your body is healing and getting used to being without cigarettes. Remember that withdrawal symptoms are easier to treat than the major diseases that smoking can cause.  Even after the  withdrawal is over, expect periodic urges to smoke. However, these cravings are generally short lived and will go away whether you smoke or not. Do not smoke! WHAT RESOURCES ARE AVAILABLE TO HELP ME QUIT SMOKING? Your health care provider can direct you to community resources or hospitals for support, which may include:  Group support.  Education.  Hypnosis.  Therapy.   This information is not intended to replace advice given to you by your health care provider. Make sure you discuss any questions you have with your health care provider.   Document Released: 05/24/2004 Document Revised: 09/16/2014 Document Reviewed: 02/11/2013 Elsevier Interactive Patient Education [employer]

## 2015-09-15 NOTE — Op Note (Signed)
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 The patient was placed in supine position the Angio table. Both groins were prepped and draped in usual sterile fashion. Local anesthesia was obtained treated of the left common femoral artery. Ultrasound was used to identify the left common femoral artery an introducer needle was used to cannulate the left common femoral artery without difficulty.    at this point an 035 versacore wire was threaded up in the abdominal aorta and a 5 French sheath was placed over the guidewire the left common femoral artery. This was thoroughly flushed with heparinized saline. 5 French pigtail catheter was then placed over the guidewire into the abdominal aorta and abdominal aortogram was obtained in AP projection. The left and right renal arteries are patent. Infrarenal abdominal aorta is dilated distally suggestive of aneurysm. The left and right common internal and external iliac arteries are patent. The pigtail catheter was pulled down just above the aortic bifurcation. Bilateral oblique and AP magnified views of the pelvis were performed to confirm no significant common external or internal iliac artery stenosis.  Next bilateral lower extremity runoff views were obtained through the catheter.  In the right lower extremity, the right common femoral is patent. The right profunda femoris is patent. There are multiple stents within the right superficial femoral  artery which are all occluded. There is reconstitution of the above-knee popliteal artery and three-vessel runoff to the right foot.  In the left lower external, there is diffuse high-grade narrowing of the entire left superficial femoral artery. The above-knee popliteal artery is patent. There is three-vessel runoff to the left foot. The left common femoral and profunda femoris arteries are also widely patent. At this point the pigtail catheter was removed over a guidewire. The patient our the procedure well and there were no complications. The 5 French sheath was thoroughly flushed with heparinized saline and left in place to be pulled in the holding area.  Operative management: The patient does not wish to have a bypass procedure at this point. He will be managed with risk factor modification and again make attempts to quit smoking as well as a graduated walking program of 30 minutes daily.  Fabienne Brunsharles Fields, MD [facility] [place] Office: [phone] Pager: [phone]

## 2015-09-15 NOTE — Progress Notes (Signed)
5Fr femoral arterial sheath aspirated and pulled.  Pressure held for , hemostasis obtained.  Site level O.  Gauze and tegaderm dressing placed over site.  Pt tolerated well.  Instructions given to pt.    Bedrest begins 12:50pm.

## 2015-09-18 ENCOUNTER — Encounter (HOSPITAL_COMMUNITY): Payer: Self-pay | Admitting: Vascular Surgery

## 2015-09-28 ENCOUNTER — Other Ambulatory Visit: Payer: Self-pay | Admitting: *Deleted

## 2015-09-28 DIAGNOSIS — I714 Abdominal aortic aneurysm, without rupture, unspecified: Secondary | ICD-10-CM

## 2015-09-29 ENCOUNTER — Telehealth: Payer: Self-pay | Admitting: Vascular Surgery

## 2015-09-29 NOTE — Telephone Encounter (Signed)
NO answer, no VM- mailed letter, dpm

## 2015-09-29 NOTE — Telephone Encounter (Signed)
-----   Message from Sharee Pimple, RN sent at 09/28/2015  2:30 PM EST ----- Regarding: schedule This is for AAA duplex lab only per Dr. Darrick Penna. It is for a baseline study.  ----- Message -----    From: Sherren Kerns, MD    Sent: 09/28/2015   1:37 PM      To: Sharee Pimple, RN  This patient was supposed to have aortic US the day of his arteriogram but he did not get it.   Can you schedule here for his convenience.  Indication was ? AAA on agram   Thanks  Leonette Most

## 2015-09-29 NOTE — Telephone Encounter (Signed)
No answer, no vm- mailed letter regarding appt, dpm

## 2015-09-29 NOTE — Telephone Encounter (Signed)
-----   Message from Sharee Pimple, RN sent at 09/28/2015  4:35 PM EST ----- Regarding: RE: schedule Dr. Darrick Penna had already looked at this today and I put the AAA into EPIC. There was a separate message sent today  ----- Message -----    From: Fredrich Birks    Sent: 09/28/2015   4:00 PM      To: Sharee Pimple, RN Subject: RE: schedule                                   Joyce Gross- I might be missing something, but I do not see where this gentleman had the ultrasound in the hospital. Can you check behind me?  Thanks, Annabelle Harman ----- Message -----    From: Sharee Pimple, RN    Sent: 09/15/2015   2:17 PM      To: Cheryll Dessert Pool Subject: schedule                                         ----- Message -----    From: Sherren Kerns, MD    Sent: 09/15/2015  12:17 PM      To: Vvs Charge Pool  [place] left groin Aortogram with bilat runoff  Pt needs follow up appt with Rosalita Chessman in 6 months with bilateral ABIs  He will also have aortic US at hospital today.  If he has AAA he will also need US aorta at that appt  Fabienne Bruns

## 2015-10-26 ENCOUNTER — Other Ambulatory Visit (HOSPITAL_COMMUNITY): Payer: Medicare Other

## 2015-12-20 ENCOUNTER — Encounter: Payer: Self-pay | Admitting: Vascular Surgery

## 2015-12-26 ENCOUNTER — Ambulatory Visit (HOSPITAL_COMMUNITY)
Admission: RE | Admit: 2015-12-26 | Discharge: 2015-12-26 | Disposition: A | Discharge status: Home / Self Care | Payer: Medicare Other | Source: Ambulatory Visit | Attending: Vascular Surgery | Admitting: Vascular Surgery

## 2015-12-26 DIAGNOSIS — I714 Abdominal aortic aneurysm, without rupture: Secondary | ICD-10-CM | POA: Diagnosis present

## 2015-12-26 DIAGNOSIS — I10 Essential (primary) hypertension: Secondary | ICD-10-CM | POA: Diagnosis not present

## 2015-12-26 DIAGNOSIS — I2510 Atherosclerotic heart disease of native coronary artery without angina pectoris: Secondary | ICD-10-CM | POA: Insufficient documentation

## 2015-12-28 ENCOUNTER — Encounter: Payer: Self-pay | Admitting: Vascular Surgery

## 2015-12-28 ENCOUNTER — Ambulatory Visit (INDEPENDENT_AMBULATORY_CARE_PROVIDER_SITE_OTHER): Payer: Medicare Other | Admitting: Vascular Surgery

## 2015-12-28 DIAGNOSIS — I714 Abdominal aortic aneurysm, without rupture, unspecified: Secondary | ICD-10-CM

## 2015-12-28 NOTE — Progress Notes (Signed)
[facility] [place] HISTORY AND PHYSICAL    History of Present Illness:  Patient is a 65 year old male who presents for evaluation of bilateral leg pain. 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 Ultrasound after that procedure also showed a small abdominal aortic aneurysm 3.7 cm diameter. The patient complains primarily of pain in his right calf that occurs after walking 200 yards. Previously he was only able to walk 50 yards. This is relieved by rest. He also complains of some numbness and tingling on his right anterior thigh but this can occur with sitting standing or spontaneously. He denies rest pain in the feet. He does have some pain in the left foot but had a crush injury to this with multiple orthopedic procedures in the past. He does have some claudication type symptoms in the left leg but not as bad as the right. According to the patient's history he had a right superficial femoral artery stent placed by Dr. Allyson Sabal in 2004. He also had a coronary stent placed in 2004 by Dr. Allyson Sabal.  Other medical problems include hypertension, chronic back pain, arthritis all of which are currently stable. He does have a history of coronary disease but denies any chest pain or shortness of breath currently. He does currently smoke and he was counseled against this for greater than 3 minutes today.    Past Medical History   Diagnosis  Date   .  Hypertension     .  Back pain     .  Chills     .  Bruises easily     .  Arthritis pain     .  Inguinal hernia         right   .  Coronary artery disease         with stenting in 2004, sees Dr Allyson Sabal   .  Arthritis     .  Anxiety     .  Inguinal hernia         Right   .  Peripheral vascular disease [facility])         Past Surgical History   Procedure  Laterality  Date   .  Orthopedic surgery       .  Stents in leg            right   .  Heart stents       .  Fracture surgery           left foot   .  Cardiac catheterization    2004       with stents x2   .  Knee arthroscopy           Right   .  Inguinal hernia repair    01/10/2012       Procedure: HERNIA REPAIR INGUINAL ADULT;  Surgeon: Cherylynn Ridges, MD;  Location: [facility] OR;  Service: General;  Laterality: Right;     Social History Social History   Substance Use Topics   .  Smoking status:  Current Every Day Smoker -- 0.75 packs/day       Types:  Cigarettes   .  Smokeless tobacco:  None   .  Alcohol Use:  No     Family History History reviewed. No pertinent family history.  Allergies    Allergies   Allergen  Reactions   .  Nadolol  Shortness Of Breath       Shortness of breath   .  Benicar Hct [Olmesartan Medoxomil-Hctz]  Other (See Comments)       Shortness of breath   .  Morphine And Related  Hives and Itching        Current Outpatient Prescriptions   Medication  Sig  Dispense  Refill   .  alprazolam (XANAX) 2 MG tablet  Take 2 mg by mouth 3 (three) times daily as needed. For anxiety       .  amLODipine (NORVASC) 10 MG tablet  Take 5 mg by mouth daily.        Marland Kitchen  BUTRANS 20 MCG/HR PTWK patch  APP 1 PATCH ONTO THE SKIN ONCE A WEEK    0   .  ibuprofen (ADVIL,MOTRIN) 200 MG tablet  Take 400 mg by mouth every 6 (six) hours as needed for pain.       Marland Kitchen  ONDANSETRON PO  Take by mouth.       Marland Kitchen  tiZANidine (ZANAFLEX) 2 MG tablet  TK 2 TS PO TID PRN    2   .  meloxicam (MOBIC) 7.5 MG tablet  Take 2 tablets (15 mg total) by mouth daily. (Patient not taking: Reported on 08/24/2015)  30 tablet  0      No current facility-administered medications for this visit.     ROS:    General:  No weight loss, Fever, chills  HEENT: No recent headaches, no nasal bleeding, no visual changes, no sore throat  Neurologic: No dizziness, blackouts, seizures. No recent symptoms of stroke or mini- stroke. No recent episodes of slurred speech, or temporary  blindness.  Cardiac: No recent episodes of chest pain/pressure, no shortness of breath at rest.  No shortness of breath with exertion.  Denies history of atrial fibrillation or irregular heartbeat  Vascular: No history of rest pain in feet.  No history of claudication.  No history of non-healing ulcer, No history of DVT    Pulmonary: No home oxygen, no productive cough, no hemoptysis,  No asthma or wheezing  Musculoskeletal:   Arthritis,  Low back pain,   Joint pain  Hematologic:No history of hypercoagulable state.  No history of easy bleeding.  No history of anemia  Gastrointestinal: No hematochezia or melena,  No gastroesophageal reflux, no trouble swallowing  Urinary:  chronic Kidney disease,  on HD -  MWF or  TTHS,  Burning with urination,  Frequent urination,  Difficulty urinating;    Skin: No rashes  Psychological: + history of anxiety,  No history of depression   Physical Examination    Filed Vitals:   12/28/15 0959  BP: 121/72  Pulse: 86  Temp: 97.1 F (36.2 C)  TempSrc: Oral  Resp: 16  Height: 6' (1.829 m)  Weight: 178 lb (80.74 kg)  SpO2: 96%    General:  Alert and oriented, no acute distress HEENT: Normal Neck: No bruit or JVD Pulmonary: Clear to auscultation bilaterally Cardiac: Regular Rate and Rhythm without murmur Abdomen: Soft, non-tender, non-distended, no mass Skin: No rash Extremity Pulses:  2+ radial, brachial, femoral, absent popliteal dorsalis pedis, posterior tibial pulses bilaterally Musculoskeletal: No deformity or edema     Neurologic: Upper and lower extremity motor 5/5 and symmetric   ASSESSMENT: Claudication   PLAN:  #1 smoking cessation was emphasized to reduce coronary artery disease improve durability of any peripheral intervention.  #  2 emphasized to the patient today that he is not currently at risk of limb loss. He will need follow-up for his 3.7 cm abdominal aortic aneurysm. He will also need repeat  ABIs in 6 months. He wishes to defer any operation for claudication at this point. He will come to see me at any time if he wishes to reconsider operation at this point but I emphasized to him this would purely be for lifestyle reasons and again emphasized that he is not currently at risk of limb loss.  Fabienne Brunsharles Fields, MD [facility] [place] Office: [phone] Pager: [phone]

## 2016-02-09 ENCOUNTER — Encounter (HOSPITAL_COMMUNITY): Payer: Self-pay | Admitting: *Deleted

## 2016-02-09 ENCOUNTER — Emergency Department (HOSPITAL_COMMUNITY): Payer: Medicare Other

## 2016-02-09 ENCOUNTER — Emergency Department (HOSPITAL_COMMUNITY)
Admission: EM | Admit: 2016-02-09 | Discharge: 2016-02-09 | Disposition: A | Discharge status: Home / Self Care | Payer: Medicare Other | Attending: Emergency Medicine | Admitting: Emergency Medicine

## 2016-02-09 DIAGNOSIS — I2510 Atherosclerotic heart disease of native coronary artery without angina pectoris: Secondary | ICD-10-CM | POA: Insufficient documentation

## 2016-02-09 DIAGNOSIS — M545 Low back pain, unspecified: Secondary | ICD-10-CM

## 2016-02-09 DIAGNOSIS — Z791 Long term (current) use of non-steroidal anti-inflammatories (NSAID): Secondary | ICD-10-CM | POA: Diagnosis not present

## 2016-02-09 DIAGNOSIS — Z79899 Other long term (current) drug therapy: Secondary | ICD-10-CM | POA: Insufficient documentation

## 2016-02-09 DIAGNOSIS — F17210 Nicotine dependence, cigarettes, uncomplicated: Secondary | ICD-10-CM | POA: Diagnosis not present

## 2016-02-09 DIAGNOSIS — I739 Peripheral vascular disease, unspecified: Secondary | ICD-10-CM | POA: Insufficient documentation

## 2016-02-09 DIAGNOSIS — I10 Essential (primary) hypertension: Secondary | ICD-10-CM | POA: Diagnosis not present

## 2016-02-09 MED ORDER — IBUPROFEN 600 MG PO TABS: 600.0000 mg | ORAL_TABLET | Freq: Four times a day (QID) | ORAL | Status: DC | PRN

## 2016-02-09 NOTE — Discharge Instructions (Signed)

## 2016-02-09 NOTE — ED Provider Notes (Signed)
CSN: [id number]     Arrival date & time 02/09/16  1536 History   First MD Initiated Contact with Patient 02/09/16 1750     Chief Complaint  Patient presents with  . Back Pain     (Consider location/radiation/quality/duration/timing/severity/associated sxs/prior Treatment) HPI   Patient is a 65 year old male with past medical history of hypertension, chronic back pain, arthritis, CAD who presents to the ED with complaint of left lower back pain, onset yesterday. Patient reports waking up yesterday morning and having sharp aching pain to his left lower back that radiates around to his left lateral/anterior thigh. He reports the pain is aggravated with walking or bending and is alleviated when laying still. Denies any recent fall, trauma, injury. He reports his pain is different and more intense than his chronic back pain. Pt denies fever, numbness, tingling, saddle anesthesia, loss of bowel or bladder, abdominal pain, N/V/D, urinary retention, weakness, IVDU, cancer or recent spinal manipulation. He reports he has been taking his home pain med prescription of oxycodone without relief.  Past Medical History  Diagnosis Date  . Hypertension   . Back pain   . Chills   . Bruises easily   . Arthritis pain   . Inguinal hernia     right  . Coronary artery disease     with stenting in 2004, sees Dr Allyson SabalBerry  . Arthritis   . Anxiety   . Inguinal hernia     Right  . Peripheral vascular disease [facility](HCC)    Past Surgical History  Procedure Laterality Date  . Orthopedic surgery    . Stents in leg      right  . Heart stents    . Fracture surgery      left foot  . Cardiac catheterization  2004    with stents x2  . Knee arthroscopy      Right  . Inguinal hernia repair  01/10/2012    Procedure: HERNIA REPAIR INGUINAL ADULT;  Surgeon: Cherylynn RidgesJames O Wyatt, MD;  [facility];  Service: General;  Laterality: Right;  . Peripheral vascular catheterization N/A 09/15/2015    Procedure: Abdominal Aortogram;   Surgeon: Sherren Kernsharles E Fields, MD;  [facility];  Service: Cardiovascular;  Laterality: N/A;   No family history on file. Social History  Substance Use Topics  . Smoking status: Current Every Day Smoker -- 0.50 packs/day    Types: Cigarettes  . Smokeless tobacco: None  . Alcohol Use: No    Review of Systems  Musculoskeletal: Positive for back pain.  All other systems reviewed and are negative.     Allergies  Butrans; Nadolol; Benicar hct; and Morphine and related  Home Medications   Prior to Admission medications   Medication Sig Start Date End Date Taking? Authorizing Provider  alprazolam Prudy Feeler(XANAX) 2 MG tablet Take 2 mg by mouth 3 (three) times daily as needed. For anxiety   Yes Historical Provider, MD  amLODipine (NORVASC) 10 MG tablet Take 5 mg by mouth daily.    Yes Rometta EmeryMohammad L Garba, MD  Oxycodone HCl 10 MG TABS Take 10 mg by mouth 4 (four) times daily.   Yes Historical Provider, MD  ibuprofen (ADVIL,MOTRIN) 600 MG tablet Take 1 tablet (600 mg total) by mouth every 6 (six) hours as needed. 02/09/16   Lyndal Pulleyaniel Knott, MD   BP 161/89 mmHg  Pulse 81  Temp(Src) 97.5 F (36.4 C) (Oral)  Resp 18  Ht 6' (1.829 m)  Wt 80.74 kg  BMI 24.14  kg/m2  SpO2 97% Physical Exam  Constitutional: He is oriented to person, place, and time. He appears well-developed and well-nourished. No distress.  HENT:  Head: Normocephalic and atraumatic.  Mouth/Throat: Oropharynx is clear and moist. No oropharyngeal exudate.  Eyes: Conjunctivae and EOM are normal. Right eye exhibits no discharge. Left eye exhibits no discharge. No scleral icterus.  Neck: Normal range of motion.  Cardiovascular: Normal rate, regular rhythm, normal heart sounds and intact distal pulses.   Pulmonary/Chest: Effort normal and breath sounds normal. No respiratory distress. He has no wheezes. He has no rales. He exhibits no tenderness.  Abdominal: Soft. Bowel sounds are normal. He exhibits no distension and no mass.  There is no tenderness. There is no rebound and no guarding.  Musculoskeletal: He exhibits no edema.  No midline C, T, or L tenderness. TTP over left lumbar paraspinal muscles. Full range of motion of neck and dec ROM of back due to reported pain. Full range of motion of bilateral upper and lower extremities, with 5/5 strength. Negative straight leg raise bilaterally. Sensation intact. 2+ radial and PT pulses. Cap refill <2 seconds. Patient able to stand and ambulate without assistance but endorses pain when bearing weight on left leg. FROM of left hip.    Neurological: He is alert and oriented to person, place, and time.  Skin: Skin is warm and dry. He is not diaphoretic.  Nursing note and vitals reviewed.   ED Course  Procedures (including critical care time) Labs Review Labs Reviewed - No data to display  Imaging Review Dg Hip Unilat With Pelvis 2-3 Views Left  02/09/2016  CLINICAL DATA:  Pain radiating into left lower extremity EXAM: DG HIP (WITH OR WITHOUT PELVIS) 2-3V LEFT COMPARISON:  MR pelvis September 12, 2011 FINDINGS: Frontal pelvis as well as frontal and lateral left hip images were obtained. There is no demonstrable fracture or dislocation. The joint spaces appear symmetric and unremarkable. 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 There are foci of atherosclerotic calcification with a stent in the right superficial femoral artery. IMPRESSION: No fracture or dislocation. No appreciable joint space narrowing. 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 There are foci of atherosclerotic calcification. Electronically Signed   By: Bretta Bang III M.D.   On: 02/09/2016 16:13   I have personally reviewed and evaluated these images and lab results as part of my medical decision-making.   EKG Interpretation None      MDM   Final diagnoses:  Left-sided low back pain without sciatica    Patient presents with left lower back pain that radiates to his left leg, that yesterday. Denies any  recent fall, trauma, injury. History of chronic back pain. Pain worse with movement or ambulation. VSS. Exam revealed tenderness over left lumbar paraspinal muscles. No neuro deficits. No back pain red flags. Patient able to stand and ambulate but endorses pain when bearing weight on left leg. Full range of motion bilateral lower extremities. Negative straight leg raise bilaterally. On initial evaluation, patient reports his pain is relieved when laying still, declined pain meds. No loss of bowel or bladder control.  No concern for cauda equina.  No fever, night sweats, weight loss, h/o cancer, IVDU.  I suspect patient's pain is likely musculoskeletal in etiology. Discussed results and plan for discharge. Plan to discharge patient home with RICE protocol and NSAIDs. Discussed return precautions with patient. Advised patient to follow up with PCP.    Satira Sark [place], [place] 02/09/16 2001  Lyndal Pulley, MD 02/10/16 [id number]

## 2016-02-09 NOTE — ED Notes (Signed)
Pt states pain to L lower back that radiates to L leg and he finds it difficulty to bear weight on it.  Hx of chronic back pain.

## 2016-02-22 ENCOUNTER — Other Ambulatory Visit: Payer: Self-pay | Admitting: *Deleted

## 2016-02-22 DIAGNOSIS — I714 Abdominal aortic aneurysm, without rupture, unspecified: Secondary | ICD-10-CM

## 2016-02-29 ENCOUNTER — Encounter: Payer: Self-pay | Admitting: Vascular Surgery

## 2016-03-07 ENCOUNTER — Ambulatory Visit: Payer: Medicare Other | Admitting: Vascular Surgery

## 2016-03-07 ENCOUNTER — Ambulatory Visit (HOSPITAL_COMMUNITY): Admission: RE | Admit: 2016-03-07 | Payer: Medicare Other | Source: Ambulatory Visit

## 2016-03-08 ENCOUNTER — Encounter (HOSPITAL_COMMUNITY): Payer: Self-pay | Admitting: Physical Medicine and Rehabilitation

## 2016-03-08 ENCOUNTER — Emergency Department (HOSPITAL_COMMUNITY)
Admission: EM | Admit: 2016-03-08 | Discharge: 2016-03-08 | Disposition: A | Discharge status: Home / Self Care | Payer: Medicare Other | Attending: Emergency Medicine | Admitting: Emergency Medicine

## 2016-03-08 DIAGNOSIS — M25552 Pain in left hip: Secondary | ICD-10-CM | POA: Diagnosis not present

## 2016-03-08 DIAGNOSIS — M5442 Lumbago with sciatica, left side: Secondary | ICD-10-CM | POA: Insufficient documentation

## 2016-03-08 DIAGNOSIS — Y939 Activity, unspecified: Secondary | ICD-10-CM | POA: Diagnosis not present

## 2016-03-08 DIAGNOSIS — I2510 Atherosclerotic heart disease of native coronary artery without angina pectoris: Secondary | ICD-10-CM | POA: Diagnosis not present

## 2016-03-08 DIAGNOSIS — I10 Essential (primary) hypertension: Secondary | ICD-10-CM | POA: Diagnosis not present

## 2016-03-08 DIAGNOSIS — Y92410 Unspecified street and highway as the place of occurrence of the external cause: Secondary | ICD-10-CM | POA: Insufficient documentation

## 2016-03-08 DIAGNOSIS — Y999 Unspecified external cause status: Secondary | ICD-10-CM | POA: Diagnosis not present

## 2016-03-08 DIAGNOSIS — F17210 Nicotine dependence, cigarettes, uncomplicated: Secondary | ICD-10-CM | POA: Insufficient documentation

## 2016-03-08 DIAGNOSIS — Z79899 Other long term (current) drug therapy: Secondary | ICD-10-CM | POA: Insufficient documentation

## 2016-03-08 MED ORDER — METHOCARBAMOL 500 MG PO TABS: 500.0000 mg | ORAL_TABLET | Freq: Two times a day (BID) | ORAL | Status: DC

## 2016-03-08 MED ORDER — IBUPROFEN 600 MG PO TABS: 600.0000 mg | ORAL_TABLET | Freq: Four times a day (QID) | ORAL | Status: DC | PRN

## 2016-03-08 MED ORDER — IBUPROFEN 400 MG PO TABS
600.0000 mg | ORAL_TABLET | Freq: Once | ORAL | Status: AC
  Administered 2016-03-08: 600 mg via ORAL
  Filled 2016-03-08: qty 1

## 2016-03-08 MED ORDER — HYDROCODONE-ACETAMINOPHEN 5-325 MG PO TABS: 1.0000 | ORAL_TABLET | ORAL | Status: DC | PRN

## 2016-03-08 MED ORDER — PREDNISONE 20 MG PO TABS
40.0000 mg | ORAL_TABLET | Freq: Once | ORAL | Status: AC
  Administered 2016-03-08: 40 mg via ORAL
  Filled 2016-03-08: qty 2

## 2016-03-08 MED ORDER — PREDNISONE 20 MG PO TABS: 40.0000 mg | ORAL_TABLET | Freq: Every day | ORAL | Status: DC

## 2016-03-08 NOTE — Discharge Instructions (Signed)
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
## 2016-03-16 ENCOUNTER — Encounter (HOSPITAL_COMMUNITY): Payer: Self-pay | Admitting: Emergency Medicine

## 2016-03-16 ENCOUNTER — Inpatient Hospital Stay (HOSPITAL_COMMUNITY)
Admission: EM | Admit: 2016-03-16 | Discharge: 2016-03-17 | DRG: 282 | Disposition: A | Discharge status: Home / Self Care | Payer: Medicare Other | Attending: Internal Medicine | Admitting: Internal Medicine

## 2016-03-16 ENCOUNTER — Emergency Department (HOSPITAL_COMMUNITY): Payer: Medicare Other

## 2016-03-16 DIAGNOSIS — M5430 Sciatica, unspecified side: Secondary | ICD-10-CM | POA: Diagnosis present

## 2016-03-16 DIAGNOSIS — Z955 Presence of coronary angioplasty implant and graft: Secondary | ICD-10-CM | POA: Diagnosis not present

## 2016-03-16 DIAGNOSIS — F17210 Nicotine dependence, cigarettes, uncomplicated: Secondary | ICD-10-CM | POA: Diagnosis present

## 2016-03-16 DIAGNOSIS — I714 Abdominal aortic aneurysm, without rupture, unspecified: Secondary | ICD-10-CM | POA: Diagnosis present

## 2016-03-16 DIAGNOSIS — I214 Non-ST elevation (NSTEMI) myocardial infarction: Secondary | ICD-10-CM

## 2016-03-16 DIAGNOSIS — G8929 Other chronic pain: Secondary | ICD-10-CM | POA: Diagnosis present

## 2016-03-16 DIAGNOSIS — E785 Hyperlipidemia, unspecified: Secondary | ICD-10-CM | POA: Diagnosis present

## 2016-03-16 DIAGNOSIS — Z885 Allergy status to narcotic agent status: Secondary | ICD-10-CM

## 2016-03-16 DIAGNOSIS — I10 Essential (primary) hypertension: Secondary | ICD-10-CM | POA: Diagnosis present

## 2016-03-16 DIAGNOSIS — I2510 Atherosclerotic heart disease of native coronary artery without angina pectoris: Secondary | ICD-10-CM | POA: Diagnosis present

## 2016-03-16 DIAGNOSIS — M79606 Pain in leg, unspecified: Secondary | ICD-10-CM | POA: Diagnosis present

## 2016-03-16 DIAGNOSIS — Z720 Tobacco use: Secondary | ICD-10-CM

## 2016-03-16 DIAGNOSIS — I739 Peripheral vascular disease, unspecified: Secondary | ICD-10-CM | POA: Diagnosis present

## 2016-03-16 DIAGNOSIS — Z79891 Long term (current) use of opiate analgesic: Secondary | ICD-10-CM | POA: Diagnosis not present

## 2016-03-16 DIAGNOSIS — Z79899 Other long term (current) drug therapy: Secondary | ICD-10-CM | POA: Diagnosis not present

## 2016-03-16 DIAGNOSIS — Z888 Allergy status to other drugs, medicaments and biological substances status: Secondary | ICD-10-CM

## 2016-03-16 HISTORY — DX: Non-ST elevation (NSTEMI) myocardial infarction: I21.4

## 2016-03-16 LAB — URINALYSIS, ROUTINE W REFLEX MICROSCOPIC
BILIRUBIN URINE: NEGATIVE
Glucose, UA: NEGATIVE mg/dL
Hgb urine dipstick: NEGATIVE
Ketones, ur: NEGATIVE mg/dL
Leukocytes, UA: NEGATIVE
NITRITE: NEGATIVE
PROTEIN: NEGATIVE mg/dL
SPECIFIC GRAVITY, URINE: 1.011 (ref 1.005–1.030)
pH: 6.5 (ref 5.0–8.0)

## 2016-03-16 LAB — CBC
HEMATOCRIT: 42.4 % (ref 39.0–52.0)
HEMOGLOBIN: 14.4 g/dL (ref 13.0–17.0)
MCH: 34.1 pg — ABNORMAL HIGH (ref 26.0–34.0)
MCHC: 34 g/dL (ref 30.0–36.0)
MCV: 100.5 fL — ABNORMAL HIGH (ref 78.0–100.0)
Platelets: 266 10*3/uL (ref 150–400)
RBC: 4.22 MIL/uL (ref 4.22–5.81)
RDW: 12.2 % (ref 11.5–15.5)
WBC: 15.5 10*3/uL — AB (ref 4.0–10.5)

## 2016-03-16 LAB — TYPE AND SCREEN
ABO/RH(D): A NEG
ANTIBODY SCREEN: NEGATIVE

## 2016-03-16 LAB — I-STAT CHEM 8, ED
BUN: 14 mg/dL (ref 6–20)
CALCIUM ION: 1.25 mmol/L — AB (ref 1.12–1.23)
CREATININE: 0.9 mg/dL (ref 0.61–1.24)
Chloride: 101 mmol/L (ref 101–111)
Glucose, Bld: 94 mg/dL (ref 65–99)
HEMATOCRIT: 41 % (ref 39.0–52.0)
HEMOGLOBIN: 13.9 g/dL (ref 13.0–17.0)
Potassium: 4.6 mmol/L (ref 3.5–5.1)
SODIUM: 139 mmol/L (ref 135–145)
TCO2: 30 mmol/L (ref 0–100)

## 2016-03-16 LAB — COMPREHENSIVE METABOLIC PANEL
ALT: 23 U/L (ref 17–63)
ANION GAP: 7 (ref 5–15)
AST: 35 U/L (ref 15–41)
Albumin: 3.6 g/dL (ref 3.5–5.0)
Alkaline Phosphatase: 45 U/L (ref 38–126)
BILIRUBIN TOTAL: 0.6 mg/dL (ref 0.3–1.2)
BUN: 11 mg/dL (ref 6–20)
CO2: 27 mmol/L (ref 22–32)
Calcium: 9.7 mg/dL (ref 8.9–10.3)
Chloride: 103 mmol/L (ref 101–111)
Creatinine, Ser: 0.82 mg/dL (ref 0.61–1.24)
Glucose, Bld: 100 mg/dL — ABNORMAL HIGH (ref 65–99)
POTASSIUM: 3.9 mmol/L (ref 3.5–5.1)
Sodium: 137 mmol/L (ref 135–145)
TOTAL PROTEIN: 7 g/dL (ref 6.5–8.1)

## 2016-03-16 LAB — TROPONIN I: TROPONIN I: 13.19 ng/mL — AB (ref <0.03)

## 2016-03-16 LAB — I-STAT TROPONIN, ED
TROPONIN I, POC: 6.2 ng/mL — AB (ref 0.00–0.08)
Troponin i, poc: 7.32 ng/mL (ref 0.00–0.08)

## 2016-03-16 LAB — LIPASE, BLOOD: Lipase: 18 U/L (ref 11–51)

## 2016-03-16 LAB — POC OCCULT BLOOD, ED: Fecal Occult Bld: NEGATIVE

## 2016-03-16 LAB — ABO/RH: ABO/RH(D): A NEG

## 2016-03-16 MED ORDER — ATORVASTATIN CALCIUM 80 MG PO TABS
80.0000 mg | ORAL_TABLET | Freq: Every day | ORAL | Status: DC
  Administered 2016-03-16: 80 mg via ORAL
  Filled 2016-03-16: qty 1

## 2016-03-16 MED ORDER — CYCLOBENZAPRINE HCL 10 MG PO TABS: 10.0000 mg | ORAL_TABLET | Freq: Once | ORAL | Status: DC

## 2016-03-16 MED ORDER — OXYCODONE-ACETAMINOPHEN 10-325 MG PO TABS: 1.0000 | ORAL_TABLET | Freq: Four times a day (QID) | ORAL | Status: DC | PRN

## 2016-03-16 MED ORDER — NITROGLYCERIN 0.4 MG SL SUBL: 0.4000 mg | SUBLINGUAL_TABLET | SUBLINGUAL | Status: DC | PRN

## 2016-03-16 MED ORDER — ALUM & MAG HYDROXIDE-SIMETH 200-200-20 MG/5ML PO SUSP
15.0000 mL | Freq: Once | ORAL | Status: DC
  Filled 2016-03-16: qty 30

## 2016-03-16 MED ORDER — OXYCODONE HCL 5 MG PO TABS
5.0000 mg | ORAL_TABLET | Freq: Four times a day (QID) | ORAL | Status: DC | PRN
  Administered 2016-03-16: 5 mg via ORAL
  Filled 2016-03-16: qty 1

## 2016-03-16 MED ORDER — CLOPIDOGREL BISULFATE 300 MG PO TABS
600.0000 mg | ORAL_TABLET | Freq: Once | ORAL | Status: AC
  Administered 2016-03-16: 600 mg via ORAL
  Filled 2016-03-16: qty 2

## 2016-03-16 MED ORDER — ASPIRIN EC 81 MG PO TBEC: 81.0000 mg | DELAYED_RELEASE_TABLET | Freq: Every day | ORAL | Status: DC

## 2016-03-16 MED ORDER — LIDOCAINE VISCOUS 2 % MT SOLN: 15.0000 mL | Freq: Once | OROMUCOSAL | Status: DC

## 2016-03-16 MED ORDER — ASPIRIN 81 MG PO CHEW
324.0000 mg | CHEWABLE_TABLET | Freq: Once | ORAL | Status: AC
  Administered 2016-03-16: 324 mg via ORAL
  Filled 2016-03-16: qty 4

## 2016-03-16 MED ORDER — NICOTINE 14 MG/24HR TD PT24
14.0000 mg | MEDICATED_PATCH | Freq: Every day | TRANSDERMAL | Status: DC
  Administered 2016-03-17: 14 mg via TRANSDERMAL
  Filled 2016-03-16: qty 1

## 2016-03-16 MED ORDER — HEPARIN BOLUS VIA INFUSION
4000.0000 [IU] | Freq: Once | INTRAVENOUS | Status: AC
  Administered 2016-03-16: 4000 [IU] via INTRAVENOUS
  Filled 2016-03-16: qty 4000

## 2016-03-16 MED ORDER — FENTANYL CITRATE (PF) 100 MCG/2ML IJ SOLN
50.0000 ug | Freq: Once | INTRAMUSCULAR | Status: AC
  Administered 2016-03-16: 50 ug via INTRAVENOUS
  Filled 2016-03-16: qty 2

## 2016-03-16 MED ORDER — HEPARIN (PORCINE) IN NACL 100-0.45 UNIT/ML-% IJ SOLN
1200.0000 [IU]/h | INTRAMUSCULAR | Status: DC
  Administered 2016-03-16: 1000 [IU]/h via INTRAVENOUS
  Filled 2016-03-16: qty 250

## 2016-03-16 MED ORDER — ALPRAZOLAM 0.25 MG PO TABS
2.0000 mg | ORAL_TABLET | Freq: Three times a day (TID) | ORAL | Status: DC
  Administered 2016-03-16: 2 mg via ORAL
  Filled 2016-03-16: qty 8

## 2016-03-16 MED ORDER — OXYCODONE-ACETAMINOPHEN 5-325 MG PO TABS: 1.0000 | ORAL_TABLET | Freq: Four times a day (QID) | ORAL | Status: DC | PRN

## 2016-03-16 MED ORDER — ONDANSETRON HCL 4 MG/2ML IJ SOLN: 4.0000 mg | Freq: Four times a day (QID) | INTRAMUSCULAR | Status: DC | PRN

## 2016-03-16 NOTE — ED Notes (Signed)
Report attempted, RN to call back. 

## 2016-03-16 NOTE — Progress Notes (Signed)
ANTICOAGULATION CONSULT NOTE - Initial Consult  Pharmacy Consult for heparin Indication: chest pain/ACS  Allergies  Allergen Reactions  . Butrans [Buprenorphine] Diarrhea and Nausea And Vomiting  . Nadolol Shortness Of Breath    Shortness of breath  . Benicar Hct [Olmesartan Medoxomil-Hctz] Other (See Comments)    Shortness of breath  . Morphine And Related Hives and Itching    Patient Measurements:   Heparin Dosing Weight: 80.7 kg  Vital Signs: Temp: 98.3 F (36.8 C) (07/08 1437) Temp Source: Oral (07/08 1437) BP: 114/84 mmHg (07/08 1621) Pulse Rate: 92 (07/08 1628)  Labs:  Recent Labs  03/16/16 1445 03/16/16 1514  HGB 14.4 13.9  HCT 42.4 41.0  PLT 266  --   CREATININE 0.82 0.90    Estimated Creatinine Clearance: 89.8 mL/min (by C-G formula based on Cr of 0.9).   Medical History: Past Medical History  Diagnosis Date  . Hypertension   . Back pain   . Chills   . Bruises easily   . Arthritis pain   . Inguinal hernia     right  . Coronary artery disease     with stenting in 2004, sees Dr Allyson SabalBerry  . Arthritis   . Anxiety   . Inguinal hernia     Right  . Peripheral vascular disease (HCC)     Assessment: 65 yom with vomiting/abdominal pain on admit. Pharmacy consulted to dose heparin for ACS/CP. No AC noted pta. CBC wnl, no bleed documented.  Goal of Therapy:  Heparin level 0.3-0.7 units/ml Monitor platelets by anticoagulation protocol: Yes   Plan:  Heparin 4000 unit bolus Start heparin at 1000 units/h 6h HL Daily HL/CBC Mon s/sx bleeding  Babs BertinHaley Linlee Cromie, PharmD, [facility] Clinical Pharmacist Pager [phone] 03/16/2016 6:06 PM
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
## 2016-03-16 NOTE — ED Provider Notes (Signed)
CSN: [id number]     Arrival date & time 03/16/16  1414 History   First MD Initiated Contact with Patient 03/16/16 1608     Chief Complaint  Patient presents with  . Sciatica  . Abdominal Pain  . Blurred Vision     (Consider location/radiation/quality/duration/timing/severity/associated sxs/prior Treatment) Patient is a 65 y.o. male presenting with back pain. The history is provided by the patient.  Back Pain Location:  Lumbar spine Quality:  Aching Radiates to:  L thigh Pain severity:  Moderate Onset quality:  Gradual Timing:  Constant Progression:  Worsening Chronicity:  Chronic Context: not emotional stress   Relieved by:  Nothing Worsened by:  Movement Ineffective treatments:  NSAIDs Associated symptoms: abdominal pain, chest pain and numbness   Associated symptoms: no fever     Past Medical History  Diagnosis Date  . Hypertension   . Back pain   . Chills   . Bruises easily   . Arthritis pain   . Inguinal hernia     right  . Coronary artery disease     with stenting in 2004, sees Dr Allyson Sabal  . Arthritis   . Anxiety   . Inguinal hernia     Right  . Peripheral vascular disease [facility])    Past Surgical History  Procedure Laterality Date  . Orthopedic surgery    . Stents in leg      right  . Heart stents    . Fracture surgery      left foot  . Cardiac catheterization  2004    with stents x2  . Knee arthroscopy      Right  . Inguinal hernia repair  01/10/2012    Procedure: HERNIA REPAIR INGUINAL ADULT;  Surgeon: Cherylynn Ridges, MD;  Location: [facility] OR;  Service: General;  Laterality: Right;  . Peripheral vascular catheterization N/A 09/15/2015    Procedure: Abdominal Aortogram;  Surgeon: Sherren Kerns, MD;  Location: [facility] INVASIVE CV LAB;  Service: Cardiovascular;  Laterality: N/A;   History reviewed. No pertinent family history. Social History  Substance Use Topics  . Smoking status: Current Every Day Smoker -- 0.50 packs/day    Types: Cigarettes  . Smokeless  tobacco: None  . Alcohol Use: No    Review of Systems  Constitutional: Negative for fever and chills.  HENT: Negative for congestion and sore throat.   Eyes: Positive for visual disturbance. Negative for pain.  Respiratory: Negative for cough and shortness of breath.   Cardiovascular: Positive for chest pain. Negative for palpitations and leg swelling.  Gastrointestinal: Positive for nausea and abdominal pain. Negative for vomiting, diarrhea and blood in stool.  Endocrine: Negative.   Genitourinary: Negative for flank pain.  Musculoskeletal: Positive for back pain. Negative for neck pain.  Skin: Negative for rash.  Allergic/Immunologic: Negative.   Neurological: Positive for numbness. Negative for dizziness, syncope and light-headedness.  Psychiatric/Behavioral: Negative for confusion.   Allergies  Benicar hct; Butrans; Nadolol; Morphine and related; and Prednisone  Home Medications   Prior to Admission medications   Medication Sig Start Date End Date Taking? Authorizing Provider  alprazolam Prudy Feeler) 2 MG tablet Take 2 mg by mouth 4 (four) times daily. For anxiety   Yes Historical Provider, MD  amLODipine (NORVASC) 10 MG tablet Take 10 mg by mouth at bedtime.    Yes Rometta Emery, MD  ibuprofen (ADVIL,MOTRIN) 600 MG tablet Take 1 tablet (600 mg total) by mouth every 6 (six) hours as needed. Patient taking differently: Take 600  mg by mouth every 6 (six) hours as needed (pain).  03/08/16  Yes Ace GinsSerena Y Sam, PA-C  oxyCODONE-acetaminophen (PERCOCET) 10-325 MG tablet Take 1 tablet by mouth every 6 (six) hours as needed for pain.  02/24/16  Yes Historical Provider, MD  aspirin EC 81 MG EC tablet Take 1 tablet (81 mg total) by mouth daily. 03/17/16   Greig CastillaGregory Thomas Means, MD  HYDROcodone-acetaminophen (NORCO/VICODIN) 5-325 MG tablet Take 1 tablet by mouth every 4 (four) hours as needed. Patient not taking: Reported on 03/16/2016 03/08/16   Ace GinsSerena Y Sam, PA-C  ibuprofen (ADVIL,MOTRIN) 600 MG  tablet Take 1 tablet (600 mg total) by mouth every 6 (six) hours as needed. Patient not taking: Reported on 03/16/2016 02/09/16   Lyndal Pulleyaniel Knott, MD  methocarbamol (ROBAXIN) 500 MG tablet Take 1 tablet (500 mg total) by mouth 2 (two) times daily. Patient not taking: Reported on 03/16/2016 03/08/16   Ace GinsSerena Y Sam, PA-C  nitroGLYCERIN (NITROSTAT) 0.4 MG SL tablet Place 1 tablet (0.4 mg total) under the tongue every 5 (five) minutes as needed for chest pain. 03/17/16   Greig CastillaGregory Thomas Means, MD   BP 112/67 mmHg  Pulse 78  Temp(Src) 98.4 F (36.9 C) (Oral)  Resp 21  Ht 6' (1.829 m)  Wt 75.9 kg  BMI 22.69 kg/m2  SpO2 99% Physical Exam  Constitutional: He is oriented to person, place, and time. He appears well-developed and well-nourished.  HENT:  Head: Normocephalic and atraumatic.  Eyes: Conjunctivae and EOM are normal. Pupils are equal, round, and reactive to light.  Neck: Normal range of motion. Neck supple.  Cardiovascular: Normal rate, regular rhythm, normal heart sounds and intact distal pulses.   Pulmonary/Chest: Effort normal and breath sounds normal. No respiratory distress.  Abdominal: Soft. Bowel sounds are normal. There is tenderness in the epigastric area. There is no rigidity, no rebound, no guarding, no CVA tenderness, no tenderness at McBurney's point and negative Murphy's sign.  Genitourinary: Rectum normal. Rectal exam shows anal tone normal. Guaiac negative stool.  Musculoskeletal: Normal range of motion.  Negative straight leg raise bilaterally  Neurological: He is alert and oriented to person, place, and time. He has normal strength and normal reflexes. No cranial nerve deficit or sensory deficit. He displays a negative Romberg sign. GCS eye subscore is 4. GCS verbal subscore is 5. GCS motor subscore is 6.  Reflex Scores:      Patellar reflexes are 2+ on the right side and 2+ on the left side. Skin: Skin is warm and dry.    ED Course  Procedures (including critical care  time) Labs Review Labs Reviewed  COMPREHENSIVE METABOLIC PANEL - Abnormal; Notable for the following:    Glucose, Bld 100 (*)    All other components within normal limits  CBC - Abnormal; Notable for the following:    WBC 15.5 (*)    MCV 100.5 (*)    MCH 34.1 (*)    All other components within normal limits  TROPONIN I - Abnormal; Notable for the following:    Troponin I 13.19 (*)    All other components within normal limits  HEPARIN LEVEL (UNFRACTIONATED) - Abnormal; Notable for the following:    Heparin Unfractionated 0.26 (*)    All other components within normal limits  TROPONIN I - Abnormal; Notable for the following:    Troponin I 9.80 (*)    All other components within normal limits  TROPONIN I - Abnormal; Notable for the following:    Troponin I  13.59 (*)    All other components within normal limits  I-STAT CHEM 8, ED - Abnormal; Notable for the following:    Calcium, Ion 1.25 (*)    All other components within normal limits  I-STAT TROPOININ, ED - Abnormal; Notable for the following:    Troponin i, poc 6.20 (*)    All other components within normal limits  I-STAT TROPOININ, ED - Abnormal; Notable for the following:    Troponin i, poc 7.32 (*)    All other components within normal limits  URINE CULTURE  URINALYSIS, ROUTINE W REFLEX MICROSCOPIC (NOT AT [facility])  LIPASE, BLOOD  TSH  HEMOGLOBIN A1C  POC OCCULT BLOOD, ED  TYPE AND SCREEN  ABO/RH    Imaging Review Dg Chest 2 View  03/16/2016  CLINICAL DATA:  Vomiting and epigastric pain EXAM: CHEST  2 VIEW COMPARISON:  Jan 10, 2012 FINDINGS: The heart size and mediastinal contours are within normal limits. Both lungs are clear. The visualized skeletal structures are unremarkable. IMPRESSION: No active cardiopulmonary disease. Electronically Signed   By: Gerome Sam III M.D   On: 03/16/2016 16:57   I have personally reviewed and evaluated these images and lab results as part of my medical decision-making.    MDM    Final diagnoses:  NSTEMI (non-ST elevated myocardial infarction) [facility])    The pt is a 65 yo M with a hx of AAA (3.7 cm last measurement) and chronic back pain presenting for concerns of back pain.  Reports mild CP 3 days prior to arrival and continued epigastric pain he attributes to the ibuprofen prescribed on previous visit.   On exam no evidence of cauda equina, conus medullaris and doubt epidural abscess.  Exam is not consistent with sciatica and no neuro deficits.  ECG performed displaying ST depression in I and AVL as well as V4-V5 with no obvious elevation.  Trop elevated to 13.  Given ASA and heparin drip started.  SL nitro and morphine given. Cardiology consulted who evaluated the pt in the ED and agreed to admission.  Only mild abdominal pain and low suspicion of AAA as etiology.  Possible anginal equivalent or gastritis.    Prior to admission the pt became very agitated and wanting to sign out AMA.  I educate the pt in depth that he is currently having a heart attack and that if he leaves he could have rhythm disturbances or worsening of his MI and suffer sudden cardiac death.  He reports understanding this and is alert and oriented x 3.  He agreed to admission.  No further acute events until admission.   Labs and ECG were viewed by myself  incorporated into medical decision making.  Discussed pertinent finding with patient or caregiver prior to discharge with no further questions.  Immediate return precautions given and understood.  Medical decision making supervised by my attending Dr. Clarene Duke.   Tery Sanfilippo, MD PGY-3 Emergency Medicine     Tery Sanfilippo, MD 03/17/16 1231  Laurence Spates, MD 03/17/16 [phone]

## 2016-03-16 NOTE — ED Notes (Signed)
Pt returned from X-ray.  

## 2016-03-16 NOTE — H&P (Signed)
Primary Cardiologist: None recently, previously Dr. Camelia Phenes, MD  Chief Complaint: Leg and Chest pain  HPI:   Cole Fuller is a 65 year old male with history of HTN, HLD, chronic back/lower extremity pain, CAD s/p LCX PCI x 2 2004 (Dr. Gwenlyn Found), and PVD with bilateral SFA disease.  He presents to the ED mainly with complaints of acute on chronic back / left sided sciatic pain which has been a chronic problem for him.  In addition, he notes that he took a prescribed combination of ibuprofen, pednison, and robaxin 2 days ago which resulted in acute severe chest pain with nausea and vommiting.  This last for more than an hour and resolved with time.  No real symptoms yesterday in regards to chest pain.  Main issue today is leg pain.  Perhaps had some mild nausea earlier today.  No bleeding/bruising issues.  Still smokes tobacco.  No Etoh/cocaine.  Asymptomatic on interview.   ROS: No edema, shortness of breath, palpitations, ulcers  In ED:  ISTAT troponin 6, ECG with ST depressions in I, AVL.  Lab troponin I 13-- repeat ECG with residual subtle lateral ST depressions and now inferior QW.    Past Medical History  Diagnosis Date  . Hypertension   . Back pain   . Chills   . Bruises easily   . Arthritis pain   . Inguinal hernia     right  . Coronary artery disease     with stenting in 2004, sees Dr Gwenlyn Found  . Arthritis   . Anxiety   . Inguinal hernia     Right  . Peripheral vascular disease [facility])     Past Surgical History  Procedure Laterality Date  . Orthopedic surgery    . Stents in leg      right  . Heart stents    . Fracture surgery      left foot  . Cardiac catheterization  2004    with stents x2  . Knee arthroscopy      Right  . Inguinal hernia repair  01/10/2012    Procedure: HERNIA REPAIR INGUINAL ADULT;  Surgeon: Gwenyth Ober, MD;  Location: [place];  Service: General;  Laterality: Right;  . Peripheral vascular catheterization N/A 09/15/2015    Procedure:  Abdominal Aortogram;  Surgeon: Elam Dutch, MD;  Location: [place] CV LAB;  Service: Cardiovascular;  Laterality: N/A;    History reviewed. No pertinent family history. Social History:  reports that he has been smoking Cigarettes.  He has been smoking about 0.50 packs per day. He does not have any smokeless tobacco history on file. He reports that he does not drink alcohol or use illicit drugs.  Allergies:  Allergies  Allergen Reactions  . Benicar Hct [Olmesartan Medoxomil-Hctz] Shortness Of Breath  . Butrans [Buprenorphine] Diarrhea and Nausea And Vomiting  . Nadolol Shortness Of Breath  . Morphine And Related Hives and Itching  . Prednisone Other (See Comments)    Chest and throat pain   No current facility-administered medications on file prior to encounter.   Current Outpatient Prescriptions on File Prior to Encounter  Medication Sig Dispense Refill  . alprazolam (XANAX) 2 MG tablet Take 2 mg by mouth 4 (four) times daily. For anxiety    . amLODipine (NORVASC) 10 MG tablet Take 10 mg by mouth at bedtime.     Marland Kitchen ibuprofen (ADVIL,MOTRIN) 600 MG tablet Take 1 tablet (600 mg total) by mouth every 6 (six)  hours as needed. (Patient taking differently: Take 600 mg by mouth every 6 (six) hours as needed (pain). ) 30 tablet 0  . HYDROcodone-acetaminophen (NORCO/VICODIN) 5-325 MG tablet Take 1 tablet by mouth every 4 (four) hours as needed. (Patient not taking: Reported on 03/16/2016) 6 tablet 0  . ibuprofen (ADVIL,MOTRIN) 600 MG tablet Take 1 tablet (600 mg total) by mouth every 6 (six) hours as needed. (Patient not taking: Reported on 03/16/2016) 30 tablet 0  . methocarbamol (ROBAXIN) 500 MG tablet Take 1 tablet (500 mg total) by mouth 2 (two) times daily. (Patient not taking: Reported on 03/16/2016) 20 tablet 0     Results for orders placed or performed during the hospital encounter of 03/16/16 (from the past 48 hour(s))  Comprehensive metabolic panel     Status: Abnormal   Collection  Time: 03/16/16  2:45 PM  Result Value Ref Range   Sodium 137 135 - 145 mmol/L   Potassium 3.9 3.5 - 5.1 mmol/L   Chloride 103 101 - 111 mmol/L   CO2 27 22 - 32 mmol/L   Glucose, Bld 100 (H) 65 - 99 mg/dL   BUN 11 6 - 20 mg/dL   Creatinine, Ser 0.82 0.61 - 1.24 mg/dL   Calcium 9.7 8.9 - 10.3 mg/dL   Total Protein 7.0 6.5 - 8.1 g/dL   Albumin 3.6 3.5 - 5.0 g/dL   AST 35 15 - 41 U/L   ALT 23 17 - 63 U/L   Alkaline Phosphatase 45 38 - 126 U/L   Total Bilirubin 0.6 0.3 - 1.2 mg/dL   GFR calc non Af Amer >60 >60 mL/min   GFR calc Af Amer >60 >60 mL/min    Comment: (NOTE) The eGFR has been calculated using the CKD EPI equation. This calculation has not been validated in all clinical situations. eGFR's persistently <60 mL/min signify possible Chronic Kidney Disease.    Anion gap 7 5 - 15  CBC     Status: Abnormal   Collection Time: 03/16/16  2:45 PM  Result Value Ref Range   WBC 15.5 (H) 4.0 - 10.5 K/uL   RBC 4.22 4.22 - 5.81 MIL/uL   Hemoglobin 14.4 13.0 - 17.0 g/dL   HCT 42.4 39.0 - 52.0 %   MCV 100.5 (H) 78.0 - 100.0 fL   MCH 34.1 (H) 26.0 - 34.0 pg   MCHC 34.0 30.0 - 36.0 g/dL   RDW 12.2 11.5 - 15.5 %   Platelets 266 150 - 400 K/uL  Type and screen [place]     Status: None   Collection Time: 03/16/16  2:45 PM  Result Value Ref Range   ABO/RH(D) A NEG    Antibody Screen NEG    Sample Expiration 03/19/2016   I-Stat Chem 8, ED     Status: Abnormal   Collection Time: 03/16/16  3:14 PM  Result Value Ref Range   Sodium 139 135 - 145 mmol/L   Potassium 4.6 3.5 - 5.1 mmol/L   Chloride 101 101 - 111 mmol/L   BUN 14 6 - 20 mg/dL   Creatinine, Ser 0.90 0.61 - 1.24 mg/dL   Glucose, Bld 94 65 - 99 mg/dL   Calcium, Ion 1.25 (H) 1.12 - 1.23 mmol/L   TCO2 30 0 - 100 mmol/L   Hemoglobin 13.9 13.0 - 17.0 g/dL   HCT 41.0 39.0 - 52.0 %  ABO/Rh     Status: None   Collection Time: 03/16/16  3:42 PM  Result Value  Ref Range   ABO/RH(D) A NEG   Lipase, blood      Status: None   Collection Time: 03/16/16  5:05 PM  Result Value Ref Range   Lipase 18 11 - 51 U/L  I-Stat Troponin, ED - 0, 3, 6 hours (not at [facility])     Status: Abnormal   Collection Time: 03/16/16  5:06 PM  Result Value Ref Range   Troponin i, poc 6.20 (HH) 0.00 - 0.08 ng/mL   Comment NOTIFIED PHYSICIAN    Comment 3            Comment: Due to the release kinetics of cTnI, a negative result within the first hours of the onset of symptoms does not rule out myocardial infarction with certainty. If myocardial infarction is still suspected, repeat the test at appropriate intervals.   Urinalysis, Routine w reflex microscopic (not at [facility])     Status: None   Collection Time: 03/16/16  5:56 PM  Result Value Ref Range   Color, Urine YELLOW YELLOW   APPearance CLEAR CLEAR   Specific Gravity, Urine 1.011 1.005 - 1.030   pH 6.5 5.0 - 8.0   Glucose, UA NEGATIVE NEGATIVE mg/dL   Hgb urine dipstick NEGATIVE NEGATIVE   Bilirubin Urine NEGATIVE NEGATIVE   Ketones, ur NEGATIVE NEGATIVE mg/dL   Protein, ur NEGATIVE NEGATIVE mg/dL   Nitrite NEGATIVE NEGATIVE   Leukocytes, UA NEGATIVE NEGATIVE    Comment: MICROSCOPIC NOT DONE ON URINES WITH NEGATIVE PROTEIN, BLOOD, LEUKOCYTES, NITRITE, OR GLUCOSE <1000 mg/dL.  POC occult blood, ED     Status: None   Collection Time: 03/16/16  6:16 PM  Result Value Ref Range   Fecal Occult Bld NEGATIVE NEGATIVE  Troponin I     Status: Abnormal   Collection Time: 03/16/16  6:47 PM  Result Value Ref Range   Troponin I 13.19 (HH) <0.03 ng/mL    Comment: CRITICAL RESULT CALLED TO, READ BACK BY AND VERIFIED WITH: BELCHER,B RN 03/16/16 1930 WOOTEN,K   I-Stat Troponin, ED - 0, 3, 6 hours (not at [facility])     Status: Abnormal   Collection Time: 03/16/16  8:18 PM  Result Value Ref Range   Troponin i, poc 7.32 (HH) 0.00 - 0.08 ng/mL   Comment NOTIFIED PHYSICIAN    Comment 3            Comment: Due to the release kinetics of cTnI, a negative result within the first  hours of the onset of symptoms does not rule out myocardial infarction with certainty. If myocardial infarction is still suspected, repeat the test at appropriate intervals.    Dg Chest 2 View  03/16/2016  CLINICAL DATA:  Vomiting and epigastric pain EXAM: CHEST  2 VIEW COMPARISON:  Jan 10, 2012 FINDINGS: The heart size and mediastinal contours are within normal limits. Both lungs are clear. The visualized skeletal structures are unremarkable. IMPRESSION: No active cardiopulmonary disease. Electronically Signed   By: Dorise Bullion III M.D   On: 03/16/2016 16:57    ROS: As above. Otherwise, review of systems is negative unless per above HPI  Filed Vitals:   03/16/16 1900 03/16/16 1915 03/16/16 1930 03/16/16 1945  BP: 108/73 109/71 122/76 125/79  Pulse: 86 87 86 75  Temp:      TempSrc:      Resp: 26 20 19 21   SpO2: 96% 96% 94% 98%   Wt Readings from Last 10 Encounters:  03/08/16 80.74 kg (178 lb)  02/09/16 80.74 kg (178 lb)  12/28/15 80.74 kg (178 lb)  09/15/15 82.101 kg (181 lb)  08/24/15 79.833 kg (176 lb)  03/22/13 77.111 kg (170 lb)  02/18/12 76.715 kg (169 lb 2 oz)  01/03/12 81.1 kg (178 lb 12.7 oz)  11/05/11 79.561 kg (175 lb 6.4 oz)  10/01/11 78.382 kg (172 lb 12.8 oz)    PE:  General: No acute distress HEENT: Atraumatic, EOMI, mucous membranes moist CV: RRR, mild SEM, no gallops. No JVD at 45 degrees. No HJR. Respiratory: Clear, no crackles. Normal work of breathing ABD: Non-distended and non-tender. Palpable abdominal aorta, non-tender Extremities: 2+ radial pulses bilaterally. Weakened peripheral LE pulses.  No edema. Neuro/Psych: CN grossly intact, alert and oriented  Last catheterization: cath 09/2004  1. Left main normal. 2. LAD: The LAD had, at most, 40% segmental proximal stenosis. 3. Left circumflex: The two previously placed stents were widely patent.  The OM branch which arose between the two stents was widely patent, as  well. There  were minor irregularities in the distal circumflex. 4. Right coronary artery: Small codominant vessel with a stable 90% distal  stenosis just proximal to the genu of the vessel. 5. Left ventriculography: Left ventriculogram was performed using 25 mL of  Visipaque dye at 12 mL per second. The overall LVEF is estimated at  greater than 60% without focal wall motion abnormalities.  Assessment/Plan Cole Fuller is a 65 YO with PAD, CAD s/p previous LCX PCI presenting with chronic back pain and likely missed inferior MI with onset of  > 48 hours ago. Has some lingering nausea yesterday but minimal since.  Initial troponin 13 and Q waves found inferiorly.  Patient has not been taking any antiplatelets at home.  Asymptomatic on exam/interview  NSTEMI, suspected missed inferior MI - s/p 325 hep, ACS heparin, planning for plavix load 600 mg x 1 given weekend for medical management of NSTEMI. - Case discussed with on call interventional team, If high risk features (recurrent symptoms, CHF, electric or hemodynamic instability) will activate cath lab team, otherwise plan for catheterization Monday.  Has good radial and right femoral pulse despite bilateral SFA disease. - Tele bed, daily ECGs, trend troponin - Atorva 80 - TTE ordered - Previous intolerance to beta blockers, have pharmacy investigate listed allergy - A1C, TSH, lipids pending  Tobacco abuse - cessation, patch therapy  Back Pain (raidular/sciatic) - Continue home percocet, have not added IV breakthrough and communicated that I do not want to mask and coronary symptoms - Patient not interested in continuing ibuoprofen, prednisone, robaxin started on 03/08/16  Chronic AAA (3.7 cm) - Palpable on exam, non-tender.  No abdominal or additional symptoms to suggest change.  Duplex 12/2015 stable.    HTN - BP soft on presentation, hold home amlodipine  Heparin/npo q 24/full code  Lolita Cram Means  MD 03/16/2016, 9:07  PM

## 2016-03-16 NOTE — ED Notes (Signed)
Pt requesting pain medication unable to get pain medication from [facility] until pharmacy release order. Pharmacy called and notified, still waiting for release order to [facility].

## 2016-03-17 LAB — TSH: TSH: 0.476 u[IU]/mL (ref 0.350–4.500)

## 2016-03-17 LAB — URINE CULTURE: CULTURE: NO GROWTH

## 2016-03-17 LAB — HEPARIN LEVEL (UNFRACTIONATED): Heparin Unfractionated: 0.26 IU/mL — ABNORMAL LOW (ref 0.30–0.70)

## 2016-03-17 LAB — TROPONIN I
TROPONIN I: 13.59 ng/mL — AB (ref <0.03)
Troponin I: 9.8 ng/mL (ref <0.03)

## 2016-03-17 MED ORDER — ASPIRIN 81 MG PO TBEC: 81.0000 mg | DELAYED_RELEASE_TABLET | Freq: Every day | ORAL | Status: DC

## 2016-03-17 MED ORDER — NITROGLYCERIN 0.4 MG SL SUBL: 0.4000 mg | SUBLINGUAL_TABLET | SUBLINGUAL | Status: DC | PRN

## 2016-03-17 NOTE — Progress Notes (Signed)
Heparin rate increase to 1200 units/hr or 12 mL/hr per Pharmacy dosing.  2nd RN verification by Cole ConnorsSharon Spradling.  Patient stable in NAD.  No reported distress or disturbance.  Heparin gtt infusing without difficulty.

## 2016-03-17 NOTE — Discharge Summary (Signed)
Physician Discharge Summary  Patient ID: Cole Fuller MRN: [id number] DOB/AGE: 14-Jul-1951 65 y.o.  Admit date: 03/16/2016 Discharge date: 03/17/2016  Primary Physician:  Eber HongBROWN,[name], MD  Primary Discharge Diagnosis:   NSTEMI  Secondary Discharge Diagnosis: Tobacco abuse Chronic back pain HTN  Procedures:  N/A  Consults:  N/A  Hospital Course: See H&P dated today for full details on presentation.  Mr. Cole Fuller is a 65 YO with PAD, CAD s/p previous LCX PCI presenting with chronic back pain and likely missed inferior MI with onset of > 48 hours ago. Has some lingering nausea yesterday but minimal since. Initial troponin 13 and Q waves found inferiorly. Patient has not been taking any antiplatelets at home. Asymptomatic on exam/interview.  I admitted the patient to the cardiology service for ACS management.   Later in the evening, I was informed that he had signed out AMA.  Per nursing, he was very agitated that he couldn't smoke his cigarettes while his wife went to smoke twice.  Also agitated that he could not eat.  He also was removing his IV.  He was told that he needed to stay as his condition was potentially life threatening, but he and his wife were comfortable with this per nursing. See nurse documentation for further details.  I've prescribed him aspirin and sub lingual nitroglycerine.  Urgent cardiology referral order placed.   Discharge Exam: Blood pressure 112/67, pulse 78, temperature 98.4 F (36.9 C), temperature source Oral, resp. rate 21, height 6' (1.829 m), weight 75.9 kg (167 lb 5.3 oz), SpO2 99 %. Weight: 75.9 kg (167 lb 5.3 oz) See exam from H&P  CBC:   Lab Results  Component Value Date   WBC 15.5* 03/16/2016   HGB 13.9 03/16/2016   HCT 41.0 03/16/2016   MCV 100.5* 03/16/2016   PLT 266 03/16/2016    CMP:  Recent Labs Lab 03/16/16 1445 03/16/16 1514  NA 137 139  K 3.9 4.6  CL 103 101  CO2 27  --   BUN 11 14  CREATININE 0.82 0.90  CALCIUM  9.7  --   PROT 7.0  --   BILITOT 0.6  --   ALKPHOS 45  --   ALT 23  --   AST 35  --   GLUCOSE 100* 94    Lipid Panel  No results found for: CHOL, TRIG, HDL, CHOLHDL, VLDL, LDLCALC  Cardiac Enzymes:  Recent Labs  03/16/16 1847 03/16/16 2320 03/17/16 0154  TROPONINI 13.19* 9.80* 13.59*    BNP (last 3 results) No results for input(s): PROBNP in the last 8760 hours.  Protime: Invalid input(s): PT  Thyroid: Lab Results  Component Value Date   TSH 0.476 03/16/2016    Radiology: Chest xray: No active cardiopulmonary disease.  EKG: NSR with ST depressions in I and aVL.  Q waves inferiorly   Discharge Medications:   Medication List    STOP taking these medications        HYDROcodone-acetaminophen 5-325 MG tablet  Commonly known as:  NORCO/VICODIN      TAKE these medications        alprazolam 2 MG tablet  Commonly known as:  XANAX  Take 2 mg by mouth 4 (four) times daily. For anxiety     amLODipine 10 MG tablet  Commonly known as:  NORVASC  Take 10 mg by mouth at bedtime.     aspirin 81 MG EC tablet  Take 1 tablet (81 mg total) by mouth daily.  ibuprofen 600 MG tablet  Commonly known as:  ADVIL,MOTRIN  Take 1 tablet (600 mg total) by mouth every 6 (six) hours as needed.     methocarbamol 500 MG tablet  Commonly known as:  ROBAXIN  Take 1 tablet (500 mg total) by mouth 2 (two) times daily.     nitroGLYCERIN 0.4 MG SL tablet  Commonly known as:  NITROSTAT  Place 1 tablet (0.4 mg total) under the tongue every 5 (five) minutes as needed for chest pain.     oxyCODONE-acetaminophen 10-325 MG tablet  Commonly known as:  PERCOCET  Take 1 tablet by mouth every 6 (six) hours as needed for pain.        Followup plans and appointments: Urgent cardiology referral order placed  Signed: Cleta Alberts, MD 03/17/2016, 4:13 AM

## 2016-03-17 NOTE — Progress Notes (Signed)
ANTICOAGULATION CONSULT NOTE  Pharmacy Consult for heparin Indication: chest pain/ACS  Allergies  Allergen Reactions  . Benicar Hct [Olmesartan Medoxomil-Hctz] Shortness Of Breath  . Butrans [Buprenorphine] Diarrhea and Nausea And Vomiting  . Nadolol Shortness Of Breath  . Morphine And Related Hives and Itching  . Prednisone Other (See Comments)    Chest and throat pain    Patient Measurements: Height: 6' (182.9 cm) Weight: 167 lb 5.3 oz (75.9 kg) IBW/kg (Calculated) : 77.6 Heparin Dosing Weight: 80.7 kg  Vital Signs: Temp: 98.4 F (36.9 C) (07/08 2236) Temp Source: Oral (07/08 2236) BP: 112/67 mmHg (07/08 2236) Pulse Rate: 78 (07/08 2236)  Labs:  Recent Labs  03/16/16 1445 03/16/16 1514 03/16/16 1847 03/16/16 2320 03/17/16 0154  HGB 14.4 13.9  --   --   --   HCT 42.4 41.0  --   --   --   PLT 266  --   --   --   --   HEPARINUNFRC  --   --   --   --  0.26*  CREATININE 0.82 0.90  --   --   --   TROPONINI  --   --  13.19* 9.80*  --     Estimated Creatinine Clearance: 87.8 mL/min (by C-G formula based on Cr of 0.9).  Assessment: 65 y.o. male with chest pain/ NSTEMI for heparin  Goal of Therapy:  Heparin level 0.3-0.7 units/ml Monitor platelets by anticoagulation protocol: Yes   Plan:  Increase Heparin 1200 units/hr Check heparin level in 6 hours.  Geannie RisenGreg Tamecca Artiga, PharmD, BCPS   03/17/2016 2:40 AM

## 2016-03-17 NOTE — Progress Notes (Signed)
Patient exhibited several episodes of agitation with constant threats to leave AMA.  Multiple interventions by nursing including placement of nicotine patch, environmental comforts, and patient education.  Teaching reinforced with patient and wife regarding elevated cardiac markers indicating the need for further workup.  Despite continued efforts by nursing staff to redirect behavior, patient decided to remove telemetry monitor and peripheral IV line with Heparin infusing.  Patient's wife supported his desire to leave and signed AMA form.  Equipment and infusion discontinued.  Charge RN, Jasmine DecemberSharon, aware.  Dr. Tammy SoursGreg Means notified.  Patient alert and oriented in NAD upon departure.  Accompanied by wife, Claris CheMargaret.

## 2016-03-17 NOTE — Progress Notes (Signed)
Bedside round and shift handoff performed due to nursing reassignment.  Patient lying in bed NAD with wife present at bedside.  Heparin gtt infusing 10 mL/hr without difficulty.  POC and treatment goals discussed.  Patient expresses desire to leave against medical advice.  Education reinforced re: need for diagnostics, evaluation, and treatment.  Patient and wife encouraged to reconsider for comprehensive evaluation.  Couple informed of realistic time frame for medical evaluation and treatment.

## 2016-03-18 LAB — OCCULT BLOOD, POC DEVICE: Fecal Occult Bld: NEGATIVE

## 2016-03-18 LAB — HEMOGLOBIN A1C
HEMOGLOBIN A1C: 5.5 % (ref 4.8–5.6)
MEAN PLASMA GLUCOSE: 111 mg/dL

## 2016-03-25 ENCOUNTER — Ambulatory Visit: Payer: Medicare Other | Admitting: Family

## 2016-03-25 ENCOUNTER — Encounter (HOSPITAL_COMMUNITY): Payer: Medicare Other

## 2016-06-13 ENCOUNTER — Ambulatory Visit (HOSPITAL_COMMUNITY): Payer: Medicare Other

## 2016-06-13 ENCOUNTER — Ambulatory Visit: Payer: Medicare Other | Admitting: Vascular Surgery

## 2016-08-15 ENCOUNTER — Other Ambulatory Visit: Payer: Self-pay | Admitting: Family

## 2016-08-15 DIAGNOSIS — R0989 Other specified symptoms and signs involving the circulatory and respiratory systems: Secondary | ICD-10-CM

## 2016-08-27 ENCOUNTER — Ambulatory Visit
Admission: RE | Admit: 2016-08-27 | Discharge: 2016-08-27 | Disposition: A | Discharge status: Home / Self Care | Payer: Medicare Other | Source: Ambulatory Visit | Attending: Family | Admitting: Family

## 2016-08-27 ENCOUNTER — Other Ambulatory Visit: Payer: Self-pay | Admitting: Family

## 2016-08-27 DIAGNOSIS — R059 Cough, unspecified: Secondary | ICD-10-CM

## 2016-08-27 DIAGNOSIS — F17200 Nicotine dependence, unspecified, uncomplicated: Secondary | ICD-10-CM

## 2016-08-27 DIAGNOSIS — R05 Cough: Secondary | ICD-10-CM

## 2017-12-06 IMAGING — DX DG HIP (WITH OR WITHOUT PELVIS) 2-3V*L*
3 series · 3 of 3 positions shown · non-contrast
Comparison: MR pelvis September 12, 2011

CLINICAL DATA: Pain radiating into left lower extremity

EXAM:
DG HIP (WITH OR WITHOUT PELVIS) 2-3V LEFT

[pelvis ap]
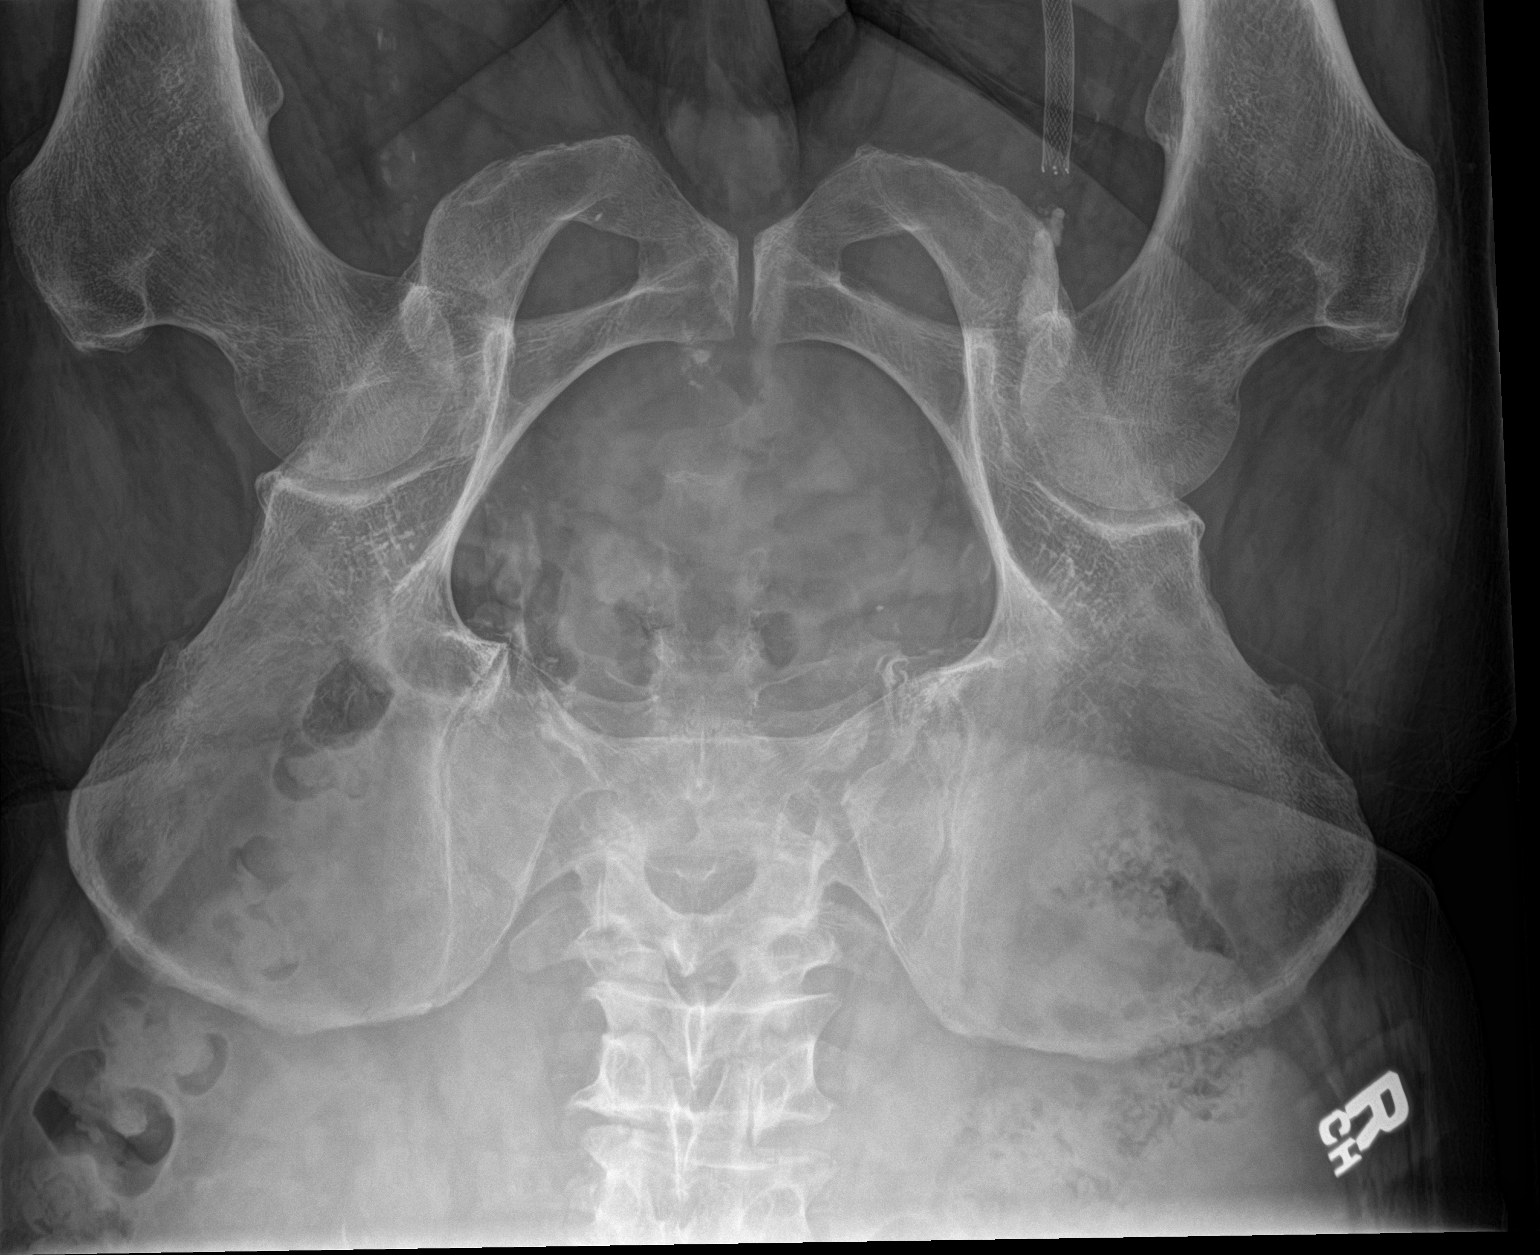

[hip ap]
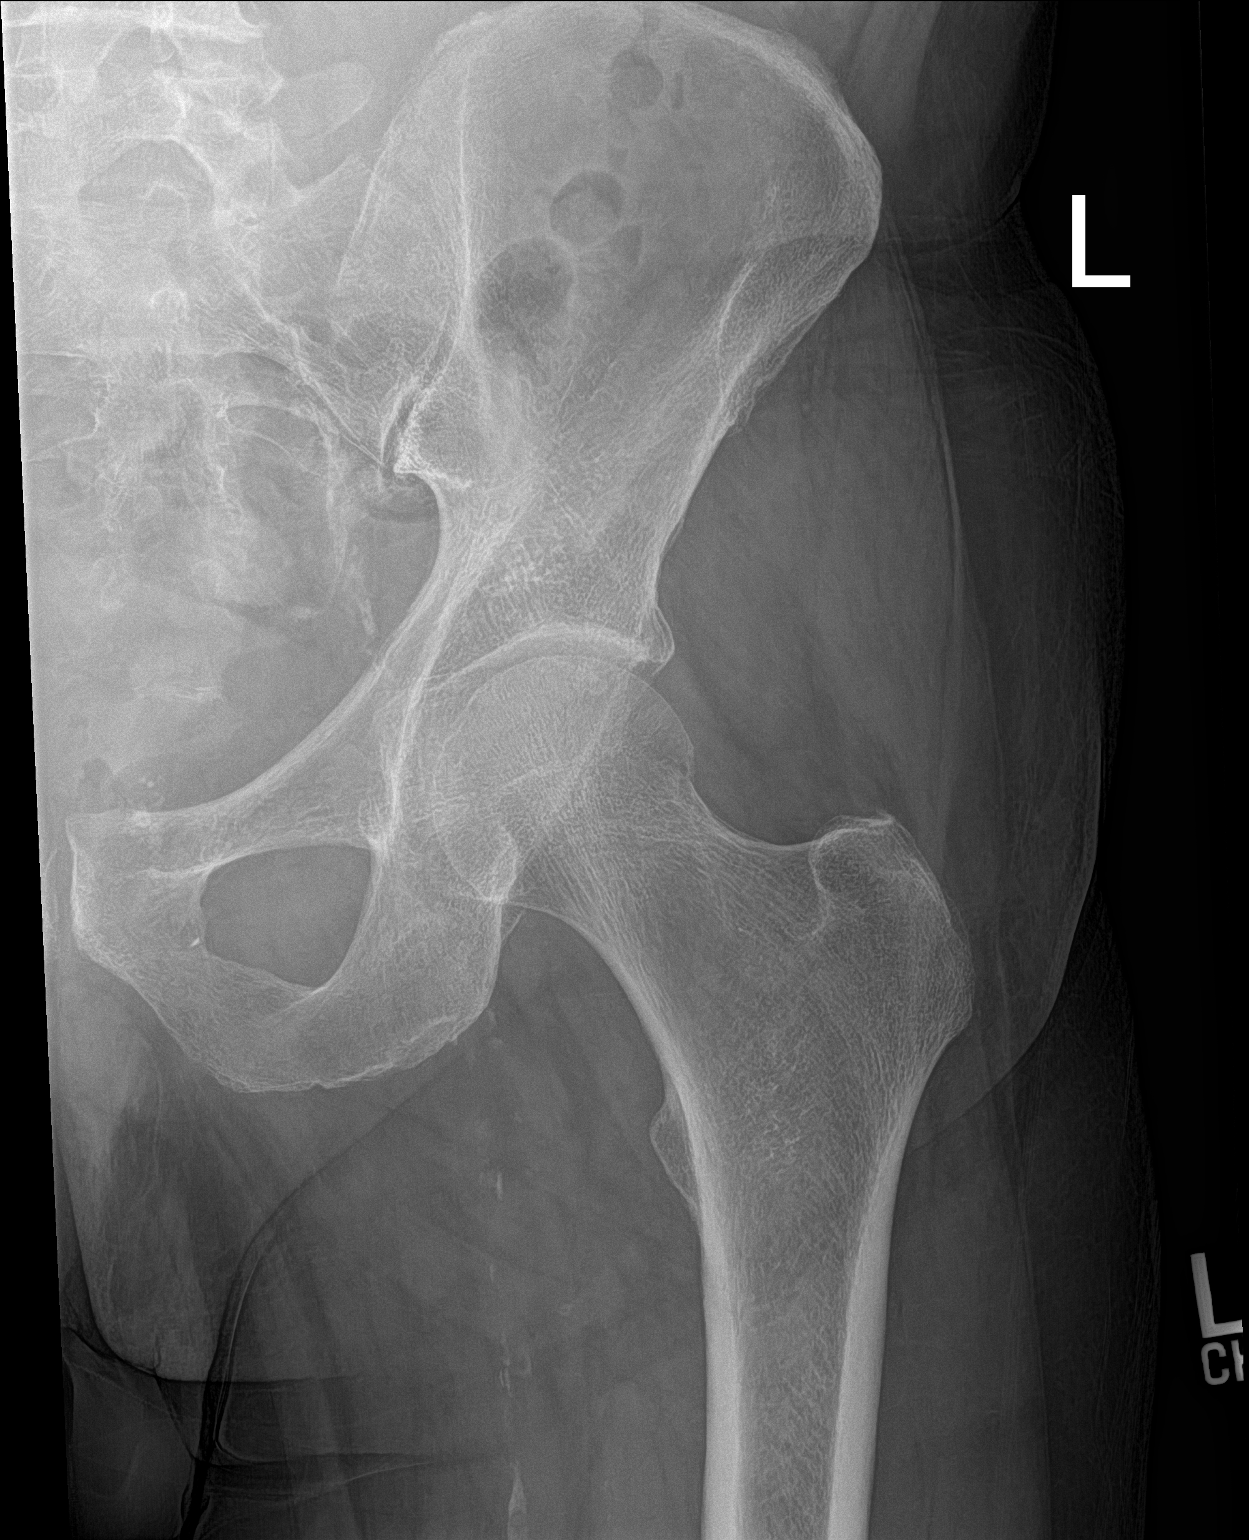

[hip lat]
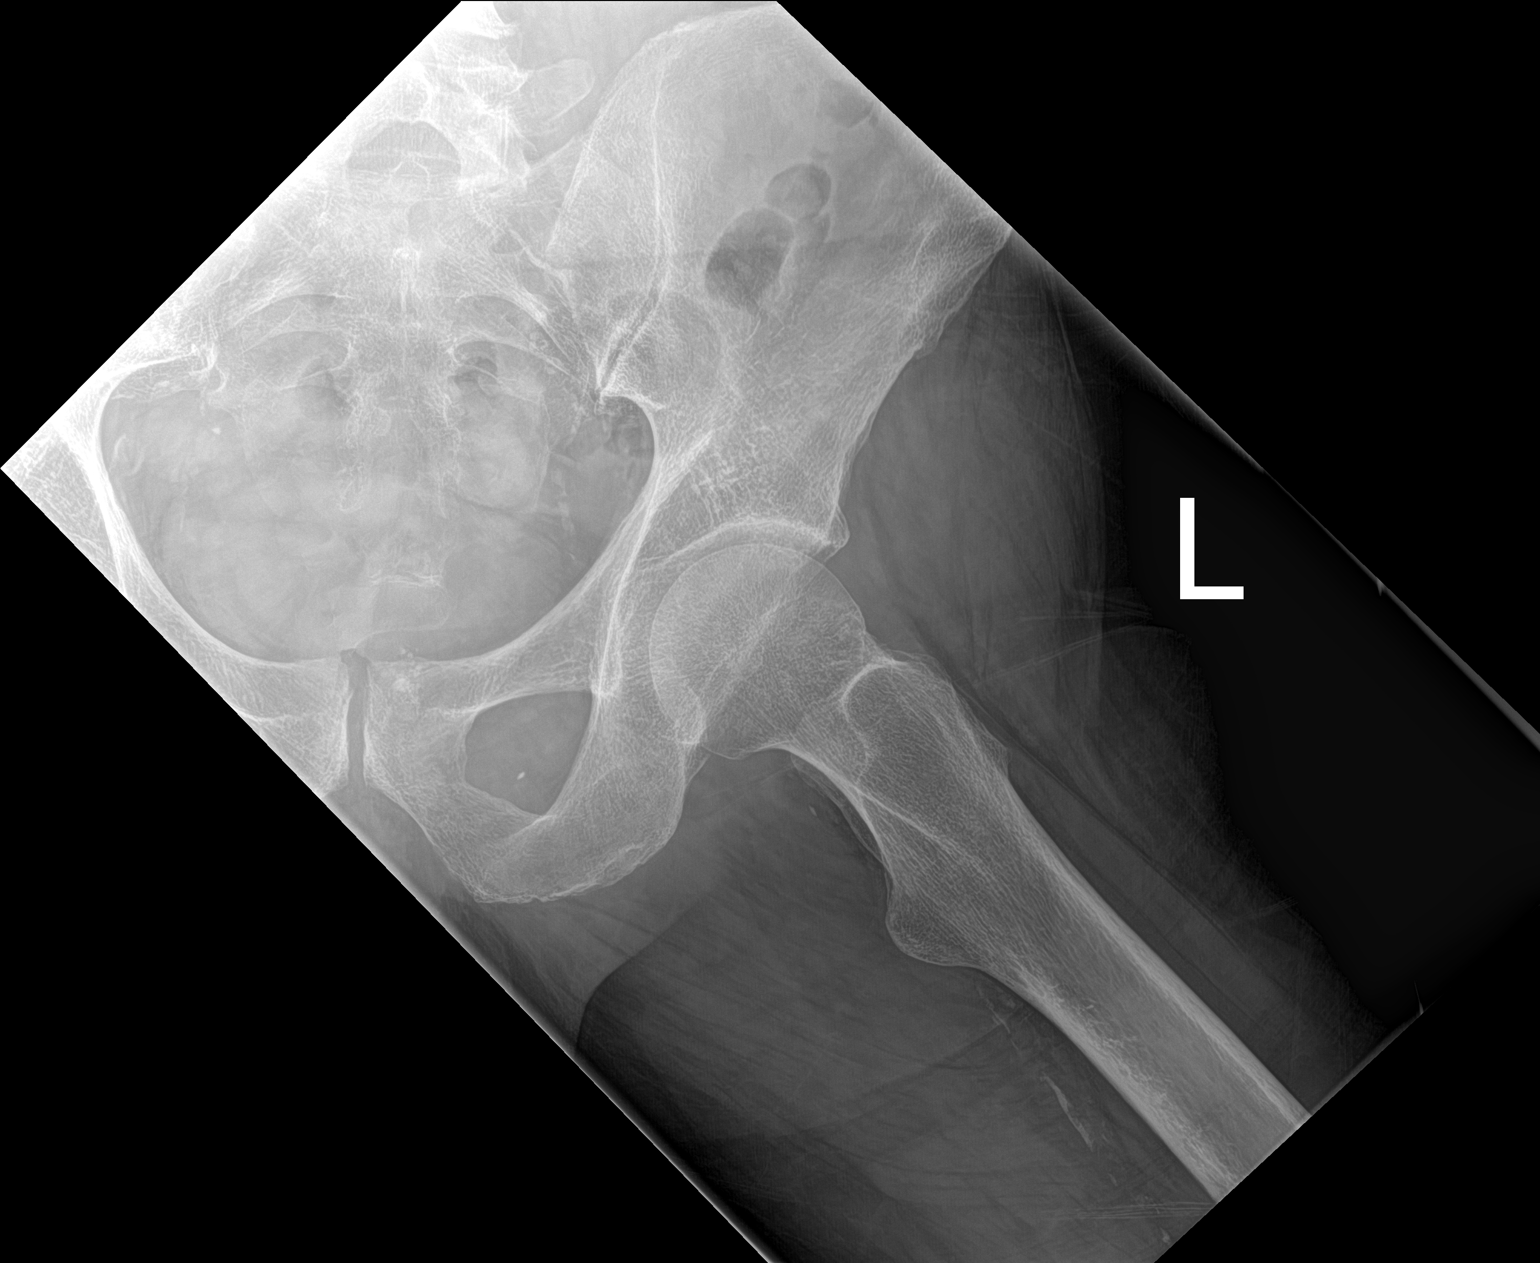

[3 of 3 positions shown; findings below may reference images not displayed]

FINDINGS: Frontal pelvis as well as frontal and lateral left hip images were
obtained. There is no demonstrable fracture or dislocation. The
joint spaces appear symmetric and unremarkable. There is
periarticular osteoporosis. There are foci of atherosclerotic
calcification with a stent in the right superficial femoral artery.
IMPRESSION: No fracture or dislocation. No appreciable joint space narrowing.
There is periarticular osteoporosis. There are foci of
atherosclerotic calcification.

## 2019-10-19 ENCOUNTER — Other Ambulatory Visit: Payer: Self-pay

## 2019-10-19 ENCOUNTER — Ambulatory Visit
Admission: RE | Admit: 2019-10-19 | Discharge: 2019-10-19 | Disposition: A | Discharge status: Home / Self Care | Payer: Medicare Other | Source: Ambulatory Visit | Attending: Pain Medicine | Admitting: Pain Medicine

## 2019-10-19 ENCOUNTER — Other Ambulatory Visit: Payer: Self-pay | Admitting: Pain Medicine

## 2019-10-19 DIAGNOSIS — M542 Cervicalgia: Secondary | ICD-10-CM

## 2019-10-19 DIAGNOSIS — M545 Low back pain, unspecified: Secondary | ICD-10-CM

## 2019-12-16 ENCOUNTER — Encounter (HOSPITAL_COMMUNITY): Payer: Self-pay | Admitting: Pediatrics

## 2019-12-16 ENCOUNTER — Inpatient Hospital Stay (HOSPITAL_COMMUNITY): Payer: Medicare Other

## 2019-12-16 ENCOUNTER — Emergency Department (HOSPITAL_BASED_OUTPATIENT_CLINIC_OR_DEPARTMENT_OTHER): Payer: Medicare Other

## 2019-12-16 ENCOUNTER — Emergency Department (HOSPITAL_COMMUNITY): Payer: Medicare Other

## 2019-12-16 ENCOUNTER — Other Ambulatory Visit: Payer: Self-pay

## 2019-12-16 ENCOUNTER — Inpatient Hospital Stay (HOSPITAL_COMMUNITY)
Admission: EM | Admit: 2019-12-16 | Discharge: 2019-12-21 | DRG: 286 | Disposition: A | Discharge status: Home Health | Payer: Medicare Other | Attending: Internal Medicine | Admitting: Internal Medicine

## 2019-12-16 DIAGNOSIS — G9341 Metabolic encephalopathy: Secondary | ICD-10-CM | POA: Diagnosis present

## 2019-12-16 DIAGNOSIS — Z09 Encounter for follow-up examination after completed treatment for conditions other than malignant neoplasm: Secondary | ICD-10-CM

## 2019-12-16 DIAGNOSIS — J9601 Acute respiratory failure with hypoxia: Secondary | ICD-10-CM | POA: Diagnosis present

## 2019-12-16 DIAGNOSIS — R9431 Abnormal electrocardiogram [ECG] [EKG]: Secondary | ICD-10-CM | POA: Diagnosis present

## 2019-12-16 DIAGNOSIS — Z955 Presence of coronary angioplasty implant and graft: Secondary | ICD-10-CM | POA: Diagnosis not present

## 2019-12-16 DIAGNOSIS — I5023 Acute on chronic systolic (congestive) heart failure: Secondary | ICD-10-CM | POA: Diagnosis present

## 2019-12-16 DIAGNOSIS — M7989 Other specified soft tissue disorders: Secondary | ICD-10-CM | POA: Diagnosis not present

## 2019-12-16 DIAGNOSIS — I5021 Acute systolic (congestive) heart failure: Secondary | ICD-10-CM | POA: Diagnosis not present

## 2019-12-16 DIAGNOSIS — Z79891 Long term (current) use of opiate analgesic: Secondary | ICD-10-CM

## 2019-12-16 DIAGNOSIS — F4321 Adjustment disorder with depressed mood: Secondary | ICD-10-CM | POA: Diagnosis present

## 2019-12-16 DIAGNOSIS — Z20822 Contact with and (suspected) exposure to covid-19: Secondary | ICD-10-CM | POA: Diagnosis present

## 2019-12-16 DIAGNOSIS — Z79899 Other long term (current) drug therapy: Secondary | ICD-10-CM | POA: Diagnosis not present

## 2019-12-16 DIAGNOSIS — F1190 Opioid use, unspecified, uncomplicated: Secondary | ICD-10-CM

## 2019-12-16 DIAGNOSIS — E876 Hypokalemia: Secondary | ICD-10-CM | POA: Diagnosis not present

## 2019-12-16 DIAGNOSIS — M7122 Synovial cyst of popliteal space [Baker], left knee: Secondary | ICD-10-CM | POA: Diagnosis present

## 2019-12-16 DIAGNOSIS — I2510 Atherosclerotic heart disease of native coronary artery without angina pectoris: Secondary | ICD-10-CM | POA: Diagnosis present

## 2019-12-16 DIAGNOSIS — M7120 Synovial cyst of popliteal space [Baker], unspecified knee: Secondary | ICD-10-CM | POA: Diagnosis present

## 2019-12-16 DIAGNOSIS — Z791 Long term (current) use of non-steroidal anti-inflammatories (NSAID): Secondary | ICD-10-CM

## 2019-12-16 DIAGNOSIS — Z720 Tobacco use: Secondary | ICD-10-CM | POA: Diagnosis present

## 2019-12-16 DIAGNOSIS — M1990 Unspecified osteoarthritis, unspecified site: Secondary | ICD-10-CM | POA: Diagnosis present

## 2019-12-16 DIAGNOSIS — F419 Anxiety disorder, unspecified: Secondary | ICD-10-CM | POA: Diagnosis present

## 2019-12-16 DIAGNOSIS — F17210 Nicotine dependence, cigarettes, uncomplicated: Secondary | ICD-10-CM | POA: Diagnosis present

## 2019-12-16 DIAGNOSIS — I110 Hypertensive heart disease with heart failure: Secondary | ICD-10-CM | POA: Diagnosis present

## 2019-12-16 DIAGNOSIS — J189 Pneumonia, unspecified organism: Secondary | ICD-10-CM | POA: Diagnosis present

## 2019-12-16 DIAGNOSIS — I714 Abdominal aortic aneurysm, without rupture, unspecified: Secondary | ICD-10-CM | POA: Diagnosis present

## 2019-12-16 DIAGNOSIS — I340 Nonrheumatic mitral (valve) insufficiency: Secondary | ICD-10-CM | POA: Diagnosis present

## 2019-12-16 DIAGNOSIS — I5031 Acute diastolic (congestive) heart failure: Secondary | ICD-10-CM | POA: Diagnosis not present

## 2019-12-16 DIAGNOSIS — I509 Heart failure, unspecified: Secondary | ICD-10-CM | POA: Diagnosis present

## 2019-12-16 DIAGNOSIS — G894 Chronic pain syndrome: Secondary | ICD-10-CM | POA: Diagnosis present

## 2019-12-16 DIAGNOSIS — Z8679 Personal history of other diseases of the circulatory system: Secondary | ICD-10-CM

## 2019-12-16 DIAGNOSIS — I739 Peripheral vascular disease, unspecified: Secondary | ICD-10-CM | POA: Diagnosis present

## 2019-12-16 DIAGNOSIS — R7989 Other specified abnormal findings of blood chemistry: Secondary | ICD-10-CM | POA: Diagnosis not present

## 2019-12-16 DIAGNOSIS — I429 Cardiomyopathy, unspecified: Secondary | ICD-10-CM | POA: Diagnosis present

## 2019-12-16 HISTORY — DX: Heart failure, unspecified: I50.9

## 2019-12-16 HISTORY — DX: Metabolic encephalopathy: G93.41

## 2019-12-16 LAB — LACTIC ACID, PLASMA
Lactic Acid, Venous: 1.3 mmol/L (ref 0.5–1.9)
Lactic Acid, Venous: 1.4 mmol/L (ref 0.5–1.9)

## 2019-12-16 LAB — CBC WITH DIFFERENTIAL/PLATELET
Abs Immature Granulocytes: 0.01 10*3/uL (ref 0.00–0.07)
Basophils Absolute: 0.1 10*3/uL (ref 0.0–0.1)
Basophils Relative: 1 %
Eosinophils Absolute: 0.1 10*3/uL (ref 0.0–0.5)
Eosinophils Relative: 1 %
HCT: 46.2 % (ref 39.0–52.0)
Hemoglobin: 14.8 g/dL (ref 13.0–17.0)
Immature Granulocytes: 0 %
Lymphocytes Relative: 21 %
Lymphs Abs: 1.5 10*3/uL (ref 0.7–4.0)
MCH: 34.5 pg — ABNORMAL HIGH (ref 26.0–34.0)
MCHC: 32 g/dL (ref 30.0–36.0)
MCV: 107.7 fL — ABNORMAL HIGH (ref 80.0–100.0)
Monocytes Absolute: 0.6 10*3/uL (ref 0.1–1.0)
Monocytes Relative: 8 %
Neutro Abs: 5.1 10*3/uL (ref 1.7–7.7)
Neutrophils Relative %: 69 %
Platelets: 211 10*3/uL (ref 150–400)
RBC: 4.29 MIL/uL (ref 4.22–5.81)
RDW: 14.2 % (ref 11.5–15.5)
WBC: 7.3 10*3/uL (ref 4.0–10.5)
nRBC: 0 % (ref 0.0–0.2)

## 2019-12-16 LAB — RAPID URINE DRUG SCREEN, HOSP PERFORMED
Amphetamines: NOT DETECTED
Barbiturates: NOT DETECTED
Benzodiazepines: POSITIVE — AB
Cocaine: NOT DETECTED
Opiates: POSITIVE — AB
Tetrahydrocannabinol: NOT DETECTED

## 2019-12-16 LAB — URINALYSIS, ROUTINE W REFLEX MICROSCOPIC
Bilirubin Urine: NEGATIVE
Glucose, UA: NEGATIVE mg/dL
Hgb urine dipstick: NEGATIVE
Ketones, ur: NEGATIVE mg/dL
Leukocytes,Ua: NEGATIVE
Nitrite: NEGATIVE
Protein, ur: 30 mg/dL — AB
Specific Gravity, Urine: 1.024 (ref 1.005–1.030)
pH: 5 (ref 5.0–8.0)

## 2019-12-16 LAB — SALICYLATE LEVEL: Salicylate Lvl: <7 mg/dL — ABNORMAL LOW (ref 7.0–30.0)

## 2019-12-16 LAB — HIV ANTIBODY (ROUTINE TESTING W REFLEX): HIV Screen 4th Generation wRfx: NONREACTIVE

## 2019-12-16 LAB — COMPREHENSIVE METABOLIC PANEL
ALT: 49 U/L — ABNORMAL HIGH (ref 0–44)
AST: 33 U/L (ref 15–41)
Albumin: 3.6 g/dL (ref 3.5–5.0)
Alkaline Phosphatase: 52 U/L (ref 38–126)
Anion gap: 9 (ref 5–15)
BUN: 11 mg/dL (ref 8–23)
CO2: 28 mmol/L (ref 22–32)
Calcium: 9.3 mg/dL (ref 8.9–10.3)
Chloride: 102 mmol/L (ref 98–111)
Creatinine, Ser: 0.91 mg/dL (ref 0.61–1.24)
GFR calc Af Amer: >60 mL/min (ref >60)
GFR calc non Af Amer: >60 mL/min (ref >60)
Glucose, Bld: 122 mg/dL — ABNORMAL HIGH (ref 70–99)
Potassium: 4.3 mmol/L (ref 3.5–5.1)
Sodium: 139 mmol/L (ref 135–145)
Total Bilirubin: 1.2 mg/dL (ref 0.3–1.2)
Total Protein: 6.5 g/dL (ref 6.5–8.1)

## 2019-12-16 LAB — CBG MONITORING, ED: Glucose-Capillary: 112 mg/dL — ABNORMAL HIGH (ref 70–99)

## 2019-12-16 LAB — TROPONIN I (HIGH SENSITIVITY)
Troponin I (High Sensitivity): 81 ng/L — ABNORMAL HIGH (ref <18)
Troponin I (High Sensitivity): 80 ng/L — ABNORMAL HIGH (ref <18)

## 2019-12-16 LAB — LIPASE, BLOOD: Lipase: 16 U/L (ref 11–51)

## 2019-12-16 LAB — STREP PNEUMONIAE URINARY ANTIGEN: Strep Pneumo Urinary Antigen: NEGATIVE

## 2019-12-16 LAB — ACETAMINOPHEN LEVEL: Acetaminophen (Tylenol), Serum: <10 ug/mL — ABNORMAL LOW (ref 10–30)

## 2019-12-16 LAB — ETHANOL: Alcohol, Ethyl (B): <10 mg/dL (ref <10)

## 2019-12-16 LAB — TSH: TSH: 1.311 u[IU]/mL (ref 0.350–4.500)

## 2019-12-16 LAB — SARS CORONAVIRUS 2 (TAT 6-24 HRS): SARS Coronavirus 2: NEGATIVE

## 2019-12-16 LAB — AMMONIA: Ammonia: 31 umol/L (ref 9–35)

## 2019-12-16 LAB — BRAIN NATRIURETIC PEPTIDE: B Natriuretic Peptide: >4500 pg/mL — ABNORMAL HIGH (ref 0.0–100.0)

## 2019-12-16 MED ORDER — SODIUM CHLORIDE 0.9 % IV SOLN
500.0000 mg | Freq: Once | INTRAVENOUS | Status: AC
  Administered 2019-12-16: 500 mg via INTRAVENOUS
  Filled 2019-12-16: qty 500

## 2019-12-16 MED ORDER — ASPIRIN 81 MG PO CHEW
81.0000 mg | CHEWABLE_TABLET | Freq: Every day | ORAL | Status: DC
  Administered 2019-12-17 – 2019-12-21 (×5): 81 mg via ORAL
  Filled 2019-12-16 (×6): qty 1

## 2019-12-16 MED ORDER — DOXYCYCLINE HYCLATE 100 MG PO TABS
100.0000 mg | ORAL_TABLET | Freq: Two times a day (BID) | ORAL | Status: DC
  Administered 2019-12-17 – 2019-12-21 (×9): 100 mg via ORAL
  Filled 2019-12-16 (×9): qty 1

## 2019-12-16 MED ORDER — NICOTINE 21 MG/24HR TD PT24
21.0000 mg | MEDICATED_PATCH | Freq: Every day | TRANSDERMAL | Status: DC
  Administered 2019-12-16 – 2019-12-21 (×6): 21 mg via TRANSDERMAL
  Filled 2019-12-16 (×6): qty 1

## 2019-12-16 MED ORDER — OXYCODONE HCL 5 MG PO TABS: 5.0000 mg | ORAL_TABLET | Freq: Four times a day (QID) | ORAL | Status: DC | PRN

## 2019-12-16 MED ORDER — SODIUM CHLORIDE 0.9% FLUSH
3.0000 mL | Freq: Once | INTRAVENOUS | Status: AC
  Administered 2019-12-16: 11:00:00 3 mL via INTRAVENOUS

## 2019-12-16 MED ORDER — SODIUM CHLORIDE 0.9 % IV SOLN
2.0000 g | INTRAVENOUS | Status: AC
  Administered 2019-12-17 – 2019-12-20 (×4): 2 g via INTRAVENOUS
  Filled 2019-12-16 (×4): qty 2
  Filled 2019-12-16: qty 20

## 2019-12-16 MED ORDER — FUROSEMIDE 10 MG/ML IJ SOLN
40.0000 mg | Freq: Two times a day (BID) | INTRAMUSCULAR | Status: DC
  Administered 2019-12-16 – 2019-12-19 (×6): 40 mg via INTRAVENOUS
  Filled 2019-12-16 (×6): qty 4

## 2019-12-16 MED ORDER — OXYCODONE-ACETAMINOPHEN 10-325 MG PO TABS: 1.0000 | ORAL_TABLET | Freq: Four times a day (QID) | ORAL | Status: DC | PRN

## 2019-12-16 MED ORDER — ACETAMINOPHEN 325 MG PO TABS: 650.0000 mg | ORAL_TABLET | ORAL | Status: DC | PRN

## 2019-12-16 MED ORDER — SODIUM CHLORIDE 0.9% FLUSH
3.0000 mL | Freq: Two times a day (BID) | INTRAVENOUS | Status: DC
  Administered 2019-12-17 – 2019-12-20 (×7): 3 mL via INTRAVENOUS

## 2019-12-16 MED ORDER — OXYCODONE-ACETAMINOPHEN 5-325 MG PO TABS: 1.0000 | ORAL_TABLET | Freq: Four times a day (QID) | ORAL | Status: DC | PRN

## 2019-12-16 MED ORDER — FUROSEMIDE 10 MG/ML IJ SOLN
40.0000 mg | Freq: Once | INTRAMUSCULAR | Status: AC
  Administered 2019-12-16: 14:00:00 40 mg via INTRAVENOUS
  Filled 2019-12-16: qty 4

## 2019-12-16 MED ORDER — SODIUM CHLORIDE 0.9 % IV SOLN
1.0000 g | Freq: Once | INTRAVENOUS | Status: AC
  Administered 2019-12-16: 1 g via INTRAVENOUS
  Filled 2019-12-16: qty 10

## 2019-12-16 MED ORDER — ONDANSETRON HCL 4 MG/2ML IJ SOLN: 4.0000 mg | Freq: Four times a day (QID) | INTRAMUSCULAR | Status: DC | PRN

## 2019-12-16 MED ORDER — SODIUM CHLORIDE 0.9 % IV SOLN: 250.0000 mL | INTRAVENOUS | Status: DC | PRN

## 2019-12-16 MED ORDER — OXYCODONE HCL 5 MG PO TABS
5.0000 mg | ORAL_TABLET | Freq: Three times a day (TID) | ORAL | Status: DC | PRN
  Administered 2019-12-16 – 2019-12-18 (×4): 5 mg via ORAL
  Filled 2019-12-16 (×4): qty 1

## 2019-12-16 MED ORDER — OXYCODONE-ACETAMINOPHEN 5-325 MG PO TABS
1.0000 | ORAL_TABLET | Freq: Three times a day (TID) | ORAL | Status: DC | PRN
  Administered 2019-12-16 – 2019-12-18 (×5): 1 via ORAL
  Filled 2019-12-16 (×6): qty 1

## 2019-12-16 MED ORDER — ENOXAPARIN SODIUM 40 MG/0.4ML ~~LOC~~ SOLN
40.0000 mg | SUBCUTANEOUS | Status: DC
  Administered 2019-12-16 – 2019-12-19 (×4): 40 mg via SUBCUTANEOUS
  Filled 2019-12-16 (×4): qty 0.4

## 2019-12-16 MED ORDER — ALPRAZOLAM 0.5 MG PO TABS
0.5000 mg | ORAL_TABLET | Freq: Four times a day (QID) | ORAL | Status: DC | PRN
  Administered 2019-12-16 – 2019-12-17 (×3): 0.5 mg via ORAL
  Filled 2019-12-16 (×3): qty 1

## 2019-12-16 MED ORDER — SODIUM CHLORIDE 0.9% FLUSH: 3.0000 mL | INTRAVENOUS | Status: DC | PRN

## 2019-12-16 NOTE — ED Notes (Signed)
Attempted report x 2. 6E RN stated not able to take, charge RN unable to take

## 2019-12-16 NOTE — ED Notes (Signed)
Attempted report x1. 

## 2019-12-16 NOTE — H&P (Addendum)
History and Physical    Cole Fuller OZH:[id number] DOB: 12/13/1950 DOA: 12/16/2019  Referring MD/NP/PA: Marianna Fuss, MD PCP: Eber Hong, MD  Patient coming from: Home  Chief Complaint: Altered mental status  I have personally briefly reviewed patient's old medical records in [facility]   HPI: Cole Fuller is a 69 y.o. male with medical history significant of CAD s/p stent in 2004, HTN, PAD s/p stent , AAA, anxiety, arthritis, tobacco abuse, and chronic pain presents after being found to be altered and this morning.  History is obtained mostly from the patient's wife over the phone.  Apparently patient had not been himself over the last 2 to 3 days.  He has a long history of chronic back and joint pains all over his body.  He had been under the care of pain management and had been switched from oxycodone to Suboxone 8 mg 3 times daily.  However, patient did not tolerate this along with the Ativan.  They had offered him injections in his back, but patient declined and left the office.  Subsequently, he received a letter in the mail stating he had been let go from that practice.  His wife notes that the patient has a problem with overusing his pain medications.  He had some pills left of oxycodone which she had been taking.  This morning when he woke up he was unable to walk and was disoriented.  Other associated symptoms included complaints of abdominal pain.    ED Course: Upon admission into the emergency department patient was seen in the afebrile, pulse 95-1 1 3, respirations 16-29, blood pressure maintained, and O2 saturations 83% on RA improved to 100% after being placed on 2 L nasal cannula.  ALT 49, BNP> 4500, troponin 81-> 80.  Urinalysis was negative for any clear signs of infection.  Chest x-ray showed a right basilar infiltrate with associated effusion concerning for acute pneumonia.  Patient was given 40 mg of Lasix and empiric antibiotics of Rocephin and  azithromycin.  Review of Systems  Unable to perform ROS: Mental status change  Cardiovascular: Positive for leg swelling.  Gastrointestinal: Positive for abdominal pain.  Musculoskeletal: Positive for back pain and joint pain.  Psychiatric/Behavioral: Positive for memory loss and substance abuse.    Past Medical History:  Diagnosis Date  . Anxiety   . Arthritis   . Arthritis pain   . Back pain   . Bruises easily   . Chills   . Coronary artery disease    with stenting in 2004, sees Dr Allyson Sabal  . Hypertension   . Inguinal hernia    right  . Inguinal hernia    Right  . Peripheral vascular disease [facility])     Past Surgical History:  Procedure Laterality Date  . CARDIAC CATHETERIZATION  2004   with stents x2  . FRACTURE SURGERY     left foot  . heart stents    . INGUINAL HERNIA REPAIR  01/10/2012   Procedure: HERNIA REPAIR INGUINAL ADULT;  Surgeon: Cherylynn Ridges, MD;  Location: [facility] OR;  Service: General;  Laterality: Right;  . KNEE ARTHROSCOPY     Right  . ORTHOPEDIC SURGERY    . PERIPHERAL VASCULAR CATHETERIZATION N/A 09/15/2015   Procedure: Abdominal Aortogram;  Surgeon: Sherren Kerns, MD;  Location: [facility] INVASIVE CV LAB;  Service: Cardiovascular;  Laterality: N/A;  . stents in leg     right     reports that he has been smoking  cigarettes. He has been smoking about 0.50 packs per day. He does not have any smokeless tobacco history on file. He reports that he does not drink alcohol or use drugs.  Allergies  Allergen Reactions  . Benicar Hct [Olmesartan Medoxomil-Hctz] Shortness Of Breath  . Butrans [Buprenorphine] Diarrhea and Nausea And Vomiting  . Nadolol Shortness Of Breath  . Morphine And Related Hives and Itching  . Prednisone Other (See Comments)    Chest and throat pain    Patient unable to provide any significant past family medical history at this time.  Prior to Admission medications   Medication Sig Start Date End Date Taking? Authorizing Provider   alprazolam Prudy Feeler) 2 MG tablet Take 2 mg by mouth 4 (four) times daily. For anxiety   Yes [provider]  nitroGLYCERIN (NITROSTAT) 0.4 MG SL tablet Place 1 tablet (0.4 mg total) under the tongue every 5 (five) minutes as needed for chest pain. 03/17/16  Yes Means, Greig Castilla, MD  oxyCODONE-acetaminophen (PERCOCET) 10-325 MG tablet Take 1 tablet by mouth every 6 (six) hours as needed for pain.  02/24/16  Yes [provider]  ibuprofen (ADVIL,MOTRIN) 600 MG tablet Take 1 tablet (600 mg total) by mouth every 6 (six) hours as needed. Patient not taking: Reported on 03/16/2016 02/09/16   Lyndal Pulley, MD  methocarbamol (ROBAXIN) 500 MG tablet Take 1 tablet (500 mg total) by mouth 2 (two) times daily. Patient not taking: Reported on 03/16/2016 03/08/16   Carlene Coria, PA-C    Physical Exam:  Constitutional: Elderly male who is altered, but able to answer questions Vitals:   12/16/19 1415 12/16/19 1500 12/16/19 1530 12/16/19 1600  BP: 107/84 117/86 126/90 121/81  Pulse: (!) 103 (!) 101 (!) 101 98  Resp: (!) 29 (!) 22 (!) 24 19  Temp:      TempSrc:      SpO2: 94% 94% 95% 96%   Eyes: PERRL, lids and conjunctivae normal ENMT: Mucous membranes are dry. Posterior pharynx clear of any exudate or lesions.  Neck: normal, supple, no masses, no thyromegaly Respiratory: Mildly tachypneic with positive crackles heard on the right lung base.  Patient currently on 2 L of nasal cannula oxygen.  Crackles.   Cardiovascular: Regular rate and rhythm with 1+ pitting edema bilateral lower extremities..  Abdomen: Presence of abdominal bruit. Musculoskeletal: no clubbing / cyanosis. No joint deformity upper and lower extremities. Good ROM, no contractures. Normal muscle tone.  Skin: no rashes, lesions, ulcers. No induration Neurologic: CN 2-12 grossly intact. Sensation intact, DTR normal. Strength 5/5 in all 4.  Psychiatric: Normal judgment and insight. Alert and oriented x 3. Normal mood.      Labs on Admission: I have personally reviewed following labs and imaging studies  CBC: Recent Labs  Lab 12/16/19 1041  WBC 7.3  NEUTROABS 5.1  HGB 14.8  HCT 46.2  MCV 107.7*  PLT 211   Basic Metabolic Panel: Recent Labs  Lab 12/16/19 1041  NA 139  K 4.3  CL 102  CO2 28  GLUCOSE 122*  BUN 11  CREATININE 0.91  CALCIUM 9.3   GFR: CrCl cannot be calculated (Unknown ideal weight.). Liver Function Tests: Recent Labs  Lab 12/16/19 1041  AST 33  ALT 49*  ALKPHOS 52  BILITOT 1.2  PROT 6.5  ALBUMIN 3.6   Recent Labs  Lab 12/16/19 1041  LIPASE 16   Recent Labs  Lab 12/16/19 1041  AMMONIA 31   Coagulation Profile: No results for input(s):  INR, PROTIME in the last 168 hours. Cardiac Enzymes: No results for input(s): CKTOTAL, CKMB, CKMBINDEX, TROPONINI in the last 168 hours. BNP (last 3 results) No results for input(s): PROBNP in the last 8760 hours. HbA1C: No results for input(s): HGBA1C in the last 72 hours. CBG: Recent Labs  Lab 12/16/19 1041  GLUCAP 112*   Lipid Profile: No results for input(s): CHOL, HDL, LDLCALC, TRIG, CHOLHDL, LDLDIRECT in the last 72 hours. Thyroid Function Tests: No results for input(s): TSH, T4TOTAL, FREET4, T3FREE, THYROIDAB in the last 72 hours. Anemia Panel: No results for input(s): VITAMINB12, FOLATE, FERRITIN, TIBC, IRON, RETICCTPCT in the last 72 hours. Urine analysis:    Component Value Date/Time   COLORURINE AMBER (A) 12/16/2019 1230   APPEARANCEUR CLEAR 12/16/2019 1230   LABSPEC 1.024 12/16/2019 1230   PHURINE 5.0 12/16/2019 1230   GLUCOSEU NEGATIVE 12/16/2019 1230   HGBUR NEGATIVE 12/16/2019 1230   BILIRUBINUR NEGATIVE 12/16/2019 1230   KETONESUR NEGATIVE 12/16/2019 1230   PROTEINUR 30 (A) 12/16/2019 1230   NITRITE NEGATIVE 12/16/2019 1230   LEUKOCYTESUR NEGATIVE 12/16/2019 1230   Sepsis Labs: No results found for this or any previous visit (from the past 240 hour(s)).   Radiological Exams on  Admission: DG Chest Portable 1 View  Result Date: 12/16/2019 CLINICAL DATA:  Increasing confusion and hypoxia EXAM: PORTABLE CHEST 1 VIEW COMPARISON:  08/27/2016 FINDINGS: Cardiac shadow is enlarged but stable. Aortic calcifications are again seen and stable. Increasing right basilar infiltrate with associated small effusion is seen. Left lung is clear. No bony abnormality is noted. IMPRESSION: Right basilar infiltrate and associated effusion consistent with acute pneumonia. Electronically Signed   By: Alcide CleverMark  Lukens M.D.   On: 12/16/2019 11:14   VAS US LOWER EXTREMITY VENOUS (DVT) (MC and WL 7a-7p)  Result Date: 12/16/2019  Lower Venous DVTStudy Indications: Swelling, and SOB.  Comparison Study: no prior Performing Technologist: Jeb LeveringJill Parker RDMS, RVT  Examination Guidelines: A complete evaluation includes B-mode imaging, spectral Doppler, color Doppler, and power Doppler as needed of all accessible portions of each vessel. Bilateral testing is considered an integral part of a complete examination. Limited examinations for reoccurring indications may be performed as noted. The reflux portion of the exam is performed with the patient in reverse Trendelenburg.  +---------+---------------+---------+-----------+----------+--------------+ RIGHT    CompressibilityPhasicitySpontaneityPropertiesThrombus Aging +---------+---------------+---------+-----------+----------+--------------+ CFV      Full           Yes      Yes                                 +---------+---------------+---------+-----------+----------+--------------+ SFJ      Full                                                        +---------+---------------+---------+-----------+----------+--------------+ FV Prox  Full                                                        +---------+---------------+---------+-----------+----------+--------------+ FV Mid   Full                                                         +---------+---------------+---------+-----------+----------+--------------+  FV DistalFull                                                        +---------+---------------+---------+-----------+----------+--------------+ PFV      Full                                                        +---------+---------------+---------+-----------+----------+--------------+ POP      Full           Yes      Yes                                 +---------+---------------+---------+-----------+----------+--------------+ PTV      Full                                                        +---------+---------------+---------+-----------+----------+--------------+ PERO     Full                                                        +---------+---------------+---------+-----------+----------+--------------+   +---------+---------------+---------+-----------+----------+--------------+ LEFT     CompressibilityPhasicitySpontaneityPropertiesThrombus Aging +---------+---------------+---------+-----------+----------+--------------+ CFV      Full           Yes      Yes                                 +---------+---------------+---------+-----------+----------+--------------+ SFJ      Full                                                        +---------+---------------+---------+-----------+----------+--------------+ FV Prox  Full                                                        +---------+---------------+---------+-----------+----------+--------------+ FV Mid   Full                                                        +---------+---------------+---------+-----------+----------+--------------+ FV DistalFull                                                        +---------+---------------+---------+-----------+----------+--------------+   PFV      Full                                                         +---------+---------------+---------+-----------+----------+--------------+ POP      Full           Yes      Yes                                 +---------+---------------+---------+-----------+----------+--------------+ PTV      Full                                                        +---------+---------------+---------+-----------+----------+--------------+ PERO     Full                                                        +---------+---------------+---------+-----------+----------+--------------+     Summary: RIGHT: - There is no evidence of deep vein thrombosis in the lower extremity.  - No cystic structure found in the popliteal fossa. - Pulsatile venous flow suggests increased right sided heart pressure.  LEFT: - There is no evidence of deep vein thrombosis in the lower extremity.  - A cystic structure is found in the popliteal fossa. - Pulsatile venous flow suggests increased right sided heart pressure.  *See table(s) above for measurements and observations. Electronically signed by Curt Jews MD on 12/16/2019 at 3:13:21 PM.    Final     EKG: Independently reviewed.  Sinus tachycardia 105 bpm with RBBB and LPFB and QTc 516.  Assessment/Plan Congestive heart failure exacerbation: Acute.  Patient presented having been found to be acutely altered.  Found to have acute lower extremity swelling.  Chest x-ray showing right-sided pleural effusion and possible infiltrate. -Admit to a telemetry bed -Heart failure orders set  initiated  -Continuous pulse oximetry with nasal cannula oxygen as needed to keep O2 saturations >92% -Strict I&Os and daily weights -Elevate lower extremities -Check TSH -Lasix 40 mg IV Bid -Reassess in a.m. and adjust diuresis as needed. -Check echocardiogram  Community acquired pneumonia: Chest x-ray showed signs of right lower lobe infiltrate.  Question of possibly secondary to aspiration.  Patient had initially been placed on empiric antibiotics of  Rocephin and azithromycin. -Changed antibiotics to Rocephin and doxycycline due to prolonged QT  Elevated troponin: High-sensitivity troponin 81->80.  Suspect likely secondary to demand in setting of CHF exacerbation. -Denies any complaints of chest pain  Acute metabolic encephalopathy: Patient's wife noted that he was confused and was not his normal self.  He had recently stopped taking Suboxone and was taking pills that he had left of oxycodone to treat pain.  UDS positive for benzos and opiates. -Neurochecks -Check acetaminophen and salicylate level   Chronic pain with chronic opioid and benzodiazepine use: Patient on oxycodone 10-325 mg every 6 hours as needed for pain and appears to be taking Xanax 2 mg 4 times daily.  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  Wife notes that he needs to find a new primary care doctor. -Decreased frequency of oxycodone every 8 hours -Decreased Ativan to 0.5 mg as needed 4 times daily  Prolonged QTc: QTC prolonged at 516. -Hold QT prolonging medications -Recheck QTC in a.m.   PAD/CAD: Patient with previous history of cardiac and right leg stents.  -Start aspirin 81 mg  History of AAA: There were reports of patient planing of abdominal pain.  He did have abdominal bruit on physical exam. -Check aorta ultrasound  Baker's cyst of the left leg: Incidental finding on vascular Doppler ultrasound of the left leg.  Tobacco abuse: Patient smokes approximately a pack cigarettes per day on average per his wife. -Nicotine patch provided  DVT prophylaxis: Lovenox Code Status: full Family Communication: Wife updated over the phone Disposition Plan: TBD  Consults called: None Admission status: Inpatient  Clydie Braun MD [facility] Pager [phone]   If 7PM-7AM, please contact night-coverage [email or web address] Password [email or web address]  12/16/2019, 4:20 PM

## 2019-12-16 NOTE — ED Triage Notes (Signed)
Arrived via EMS; concern for intermittent confusion the last 3-5 days; concern for abdominal distention as well. EMS endorsed low Sp02 on RA; has a chrornic pain management s/p crush injury. Patient appears jaundice as well.

## 2019-12-16 NOTE — ED Provider Notes (Signed)
[facility] EMERGENCY DEPARTMENT Provider Note   CSN: [id number] Arrival date & time: 12/16/19  1036     History Chief Complaint  Patient presents with  . Abdominal Pain  . Altered Mental Status    Cole Fuller is a 69 y.o. male.  Presents to ER with concern for altered mental status.  Reportedly has had intermittent confusion over the past couple days, worsening.  Also reported abdominal distention, EMS noted low oxygen saturations on room air.  Patient denies any acute complaints, states that he has chronic pain and has pain all over his body.  He does not have any specific chest pain or abdominal pain.  He has not noted any new abdominal swelling or leg swelling.  Reports his leg swelling is chronic.  History limited due to patient's confusion, obtained by patient and chart review and EMS report.  Attempted to call family but no answer.  HPI     Past Medical History:  Diagnosis Date  . Anxiety   . Arthritis   . Arthritis pain   . Back pain   . Bruises easily   . Chills   . Coronary artery disease    with stenting in 2004, sees Dr Gwenlyn Found  . Hypertension   . Inguinal hernia    right  . Inguinal hernia    Right  . Peripheral vascular disease [facility])     Patient Active Problem List   Diagnosis Date Noted  . NSTEMI (non-ST elevated myocardial infarction) ([place]) 03/16/2016  . PAD (peripheral artery disease) ([place]) 03/16/2016  . AAA (abdominal aortic aneurysm) ([place]) 03/16/2016  . HTN (hypertension) 03/16/2016  . Tobacco abuse 03/16/2016  . Postop check 02/18/2012  . Right inguinal hernia 10/01/2011    Past Surgical History:  Procedure Laterality Date  . CARDIAC CATHETERIZATION  2004   with stents x2  . FRACTURE SURGERY     left foot  . heart stents    . INGUINAL HERNIA REPAIR  01/10/2012   Procedure: HERNIA REPAIR INGUINAL ADULT;  Surgeon: Gwenyth Ober, MD;  Location: [facility];  Service: General;  Laterality: Right;  . KNEE ARTHROSCOPY     Right    . ORTHOPEDIC SURGERY    . PERIPHERAL VASCULAR CATHETERIZATION N/A 09/15/2015   Procedure: Abdominal Aortogram;  Surgeon: Elam Dutch, MD;  Location: [facility];  Service: Cardiovascular;  Laterality: N/A;  . stents in leg     right       No family history on file.  Social History   Tobacco Use  . Smoking status: Current Every Day Smoker    Packs/day: 0.50    Types: Cigarettes  Substance Use Topics  . Alcohol use: No    Alcohol/week: 0.0 standard drinks  . Drug use: No    Home Medications Prior to Admission medications   Medication Sig Start Date End Date Taking? Authorizing Provider  alprazolam Duanne Moron) 2 MG tablet Take 2 mg by mouth 4 (four) times daily. For anxiety   Yes [provider]  nitroGLYCERIN (NITROSTAT) 0.4 MG SL tablet Place 1 tablet (0.4 mg total) under the tongue every 5 (five) minutes as needed for chest pain. 03/17/16  Yes Means, Lolita Cram, MD  oxyCODONE-acetaminophen (PERCOCET) 10-325 MG tablet Take 1 tablet by mouth every 6 (six) hours as needed for pain.  02/24/16  Yes [provider]  ibuprofen (ADVIL,MOTRIN) 600 MG tablet Take 1 tablet (600 mg total) by mouth every 6 (six) hours as needed.  Patient not taking: Reported on 03/16/2016 02/09/16   Lyndal Pulley, MD  methocarbamol (ROBAXIN) 500 MG tablet Take 1 tablet (500 mg total) by mouth 2 (two) times daily. Patient not taking: Reported on 03/16/2016 03/08/16   Carlene Coria, PA-C    Allergies    Benicar hct [olmesartan medoxomil-hctz], Butrans [buprenorphine], Nadolol, Morphine and related, and Prednisone  Review of Systems   Review of Systems  Unable to perform ROS: Mental status change  Constitutional: Positive for chills. Negative for fever.  HENT: Negative for ear pain and sore throat.   Eyes: Negative for pain and visual disturbance.  Respiratory: Negative for cough and shortness of breath.   Cardiovascular: Negative for chest pain and palpitations.  Gastrointestinal:  Negative for abdominal pain and vomiting.  Genitourinary: Negative for dysuria and hematuria.  Musculoskeletal: Negative for arthralgias and back pain.  Skin: Negative for color change and rash.  Neurological: Positive for weakness. Negative for seizures and syncope.  All other systems reviewed and are negative.   Physical Exam Updated Vital Signs BP (!) 132/96   Pulse (!) 102   Temp 98.3 F (36.8 C) (Oral)   Resp (!) 25   SpO2 100%   Physical Exam Constitutional:      Comments: Chronically ill-appearing but no acute distress, seems to be mildly confused but answering most questions appropriately  HENT:     Head: Normocephalic.     Mouth/Throat:     Mouth: Mucous membranes are moist.  Eyes:     Extraocular Movements: Extraocular movements intact.  Cardiovascular:     Rate and Rhythm: Tachycardia present.     Heart sounds: Normal heart sounds. No murmur. No gallop.   Pulmonary:     Comments: Subtle tachypnea, no respiratory distress, crackles at right base noted Abdominal:     General: Abdomen is flat. Bowel sounds are normal. There is no distension.     Palpations: Abdomen is soft. There is no mass.     Tenderness: There is no abdominal tenderness.  Musculoskeletal:     Comments: Bilateral lower extremity edema, mild pitting edema in bilateral lower lower legs  Skin:    General: Skin is warm and dry.  Neurological:     General: No focal deficit present.     Mental Status: He is oriented to person, place, and time.     ED Results / Procedures / Treatments   Labs (all labs ordered are listed, but only abnormal results are displayed) Labs Reviewed  CBC WITH DIFFERENTIAL/PLATELET - Abnormal; Notable for the following components:      Result Value   MCV 107.7 (*)    MCH 34.5 (*)    All other components within normal limits  CBG MONITORING, ED - Abnormal; Notable for the following components:   Glucose-Capillary 112 (*)    All other components within normal limits    LACTIC ACID, PLASMA  COMPREHENSIVE METABOLIC PANEL  AMMONIA  LIPASE, BLOOD  URINALYSIS, ROUTINE W REFLEX MICROSCOPIC  RAPID URINE DRUG SCREEN, HOSP PERFORMED  ETHANOL  BRAIN NATRIURETIC PEPTIDE  LACTIC ACID, PLASMA  TROPONIN I (HIGH SENSITIVITY)    EKG EKG Interpretation  Date/Time:  Thursday December 16 2019 10:41:52 EDT Ventricular Rate:  105 PR Interval:    QRS Duration: 163 QT Interval:  390 QTC Calculation: 516 R Axis:   119 Text Interpretation: Sinus tachycardia Left atrial enlargement RBBB and LPFB Repol abnrm suggests ischemia, lateral leads t wave inversion and st depression in lateral leads new since 2017  Confirmed by Marianna Fuss ([id number]) on 12/16/2019 10:49:42 AM   Radiology DG Chest Portable 1 View  Result Date: 12/16/2019 CLINICAL DATA:  Increasing confusion and hypoxia EXAM: PORTABLE CHEST 1 VIEW COMPARISON:  08/27/2016 FINDINGS: Cardiac shadow is enlarged but stable. Aortic calcifications are again seen and stable. Increasing right basilar infiltrate with associated small effusion is seen. Left lung is clear. No bony abnormality is noted. IMPRESSION: Right basilar infiltrate and associated effusion consistent with acute pneumonia. Electronically Signed   By: Alcide Clever M.D.   On: 12/16/2019 11:14    Procedures .Critical Care Performed by: Milagros Loll, MD Authorized by: Milagros Loll, MD   Critical care provider statement:    Critical care time (minutes):  45   Critical care was necessary to treat or prevent imminent or life-threatening deterioration of the following conditions:  Respiratory failure   Critical care was time spent personally by me on the following activities:  Discussions with consultants, evaluation of patient's response to treatment, examination of patient, ordering and performing treatments and interventions, ordering and review of laboratory studies, ordering and review of radiographic studies, pulse oximetry, re-evaluation of  patient's condition, obtaining history from patient or surrogate and review of old charts   (including critical care time)  Medications Ordered in ED Medications  cefTRIAXone (ROCEPHIN) 1 g in sodium chloride 0.9 % 100 mL IVPB (has no administration in time range)  azithromycin (ZITHROMAX) 500 mg in sodium chloride 0.9 % 250 mL IVPB (has no administration in time range)  sodium chloride flush (NS) 0.9 % injection 3 mL (3 mLs Intravenous Given 12/16/19 1048)    ED Course  I have reviewed the triage vital signs and the nursing notes.  Pertinent labs & imaging results that were available during my care of the patient were reviewed by me and considered in my medical decision making (see chart for details).  Clinical Course as of Dec 16 1154  Thu Dec 16, 2019  1055 Saw and evaluated patient   [RD]  1140 Likely pneumonia, will start abx   [RD]  1143 rechecked   [RD]    Clinical Course User Index [RD] Milagros Loll, MD   MDM Rules/Calculators/A&P                      69 year old male presents to ER from home with concern for increased confusion, generalized weakness.  On exam noted to have mild confusion but no focal neurologic deficits were appreciated.  Answering most questions appropriately.  Also noted to be mildly hypoxic requiring 2 to 3 L nasal cannula.  CXR concerning for pneumonia versus fluid overload.  Will treat empirically for both.  Please order for IV Lasix, IV antibiotics.  Consulted hospitalist for admission.  Discussed case with Dr. Katrinka Blazing who will admit to his service.   Final Clinical Impression(s) / ED Diagnoses Final diagnoses:  Congestive heart failure, unspecified HF chronicity, unspecified heart failure type (HCC)  Community acquired pneumonia, unspecified laterality  Acute respiratory failure with hypoxia (HCC)    Rx / DC Orders ED Discharge Orders    None       Milagros Loll, MD 12/16/19 [phone]

## 2019-12-16 NOTE — Progress Notes (Signed)
Lower venous duplex       has been completed. Preliminary results can be found under CV proc through chart review. Lakeesha Fontanilla, BS, RDMS, RVT   

## 2019-12-16 NOTE — ED Notes (Signed)
EMS had Pt on 3L nasal O2. Unhooked O2 while getting pt from EMS stretcher to our stretcher, then undressed and hooked up to Monitor within 5 minutes Pt went from 100% to 83%. Put Pt on 3L Nasal O2, Pt only went up to 85%. Put Pt on 5L and Pt went up to 100%. After about 5 more minutes we lowered Pt back to 3L. Pt is currently 99%

## 2019-12-17 ENCOUNTER — Inpatient Hospital Stay (HOSPITAL_COMMUNITY): Payer: Medicare Other

## 2019-12-17 DIAGNOSIS — I5031 Acute diastolic (congestive) heart failure: Secondary | ICD-10-CM

## 2019-12-17 DIAGNOSIS — I2510 Atherosclerotic heart disease of native coronary artery without angina pectoris: Secondary | ICD-10-CM

## 2019-12-17 DIAGNOSIS — I5021 Acute systolic (congestive) heart failure: Secondary | ICD-10-CM

## 2019-12-17 DIAGNOSIS — Z720 Tobacco use: Secondary | ICD-10-CM

## 2019-12-17 DIAGNOSIS — R7989 Other specified abnormal findings of blood chemistry: Secondary | ICD-10-CM

## 2019-12-17 LAB — POTASSIUM: Potassium: 4.6 mmol/L (ref 3.5–5.1)

## 2019-12-17 LAB — BASIC METABOLIC PANEL
Anion gap: 13 (ref 5–15)
BUN: 12 mg/dL (ref 8–23)
CO2: 27 mmol/L (ref 22–32)
Calcium: 8.9 mg/dL (ref 8.9–10.3)
Chloride: 101 mmol/L (ref 98–111)
Creatinine, Ser: 0.81 mg/dL (ref 0.61–1.24)
GFR calc Af Amer: >60 mL/min (ref >60)
GFR calc non Af Amer: >60 mL/min (ref >60)
Glucose, Bld: 97 mg/dL (ref 70–99)
Potassium: 5.2 mmol/L — ABNORMAL HIGH (ref 3.5–5.1)
Sodium: 141 mmol/L (ref 135–145)

## 2019-12-17 LAB — ECHOCARDIOGRAM COMPLETE
Height: 72 in
Weight: 2560 oz

## 2019-12-17 LAB — LEGIONELLA PNEUMOPHILA SEROGP 1 UR AG: L. pneumophila Serogp 1 Ur Ag: NEGATIVE

## 2019-12-17 MED ORDER — PANTOPRAZOLE SODIUM 40 MG PO TBEC
40.0000 mg | DELAYED_RELEASE_TABLET | Freq: Every day | ORAL | Status: DC
  Administered 2019-12-17 – 2019-12-21 (×5): 40 mg via ORAL
  Filled 2019-12-17 (×5): qty 1

## 2019-12-17 MED ORDER — SUCRALFATE 1 GM/10ML PO SUSP
1.0000 g | Freq: Three times a day (TID) | ORAL | Status: DC
  Administered 2019-12-17 – 2019-12-21 (×16): 1 g via ORAL
  Filled 2019-12-17 (×15): qty 10

## 2019-12-17 MED ORDER — LOSARTAN POTASSIUM 25 MG PO TABS
12.5000 mg | ORAL_TABLET | Freq: Every day | ORAL | Status: DC
  Administered 2019-12-17 – 2019-12-21 (×5): 12.5 mg via ORAL
  Filled 2019-12-17 (×5): qty 1

## 2019-12-17 MED ORDER — IOHEXOL 300 MG/ML  SOLN
100.0000 mL | Freq: Once | INTRAMUSCULAR | Status: AC | PRN
  Administered 2019-12-17: 100 mL via INTRAVENOUS

## 2019-12-17 NOTE — Progress Notes (Addendum)
PROGRESS NOTE    Cole Fuller  JQG:[id number] DOB: November 09, 1950 DOA: 12/16/2019 PCP: Osa Craver, MD    Brief Narrative:   69-year-old gentleman prior history of coronary artery disease s/p PCI in 2004, peripheral artery disease s/p stent, AAA, hypertension, ongoing tobacco abuse, chronic pain syndrome was brought into ED for altered mental status on 12/16/2019 his chest x-ray showed right basilar infiltrate with associated effusion concerning for right-sided pneumonia patient was started on empiric antibiotics and referred to [facility] for admission.  Assessment & Plan:   Principal Problem:   Acute exacerbation of CHF (congestive heart failure) (HCC) Active Problems:   PAD (peripheral artery disease) (HCC)   AAA (abdominal aortic aneurysm) (HCC)   Tobacco abuse   Chronic pain syndrome   Prolonged QT interval   Acute metabolic encephalopathy   Baker cyst   Community-acquired pneumonia/right-sided pneumonia Continue with IV Rocephin and doxycycline.  Follow blood cultures and send urine for urine streptococcal and Legionella antigens. Nasal cannula oxygen to keep sats greater than 19%    Acute systolic heart failure associated evident with right-sided pleural effusion bilateral lower extremity edema Echocardiogram reveals Left ventricular ejection fraction, by estimation, is 15-20%. The left ventricle has severely decreased function. The left ventricle demonstrates global hypokinesis. The left ventricular internal cavity size was severely dilated. Indeterminate diastolic filling due to E-A fusion. 2. Right ventricular systolic function is low normal. The right ventricular size is mildly enlarged. There is moderately elevated pulmonary artery systolic pressure. The estimated right ventricular systolic pressure is 75.8 mmHg.   Continue with IV Lasix, strict intake and output and daily weights. We will get cardiology input in the morning for further evaluation.     QT  interval Repeat EKG in the morning and follow. Keep potassium greater than 4 and magnesium greater than 2.     Chronic pain syndrome Resume home medications at this time.    Peripheral artery disease Resume aspirin   Acute encephalopathy probably secondary to medications. Resolved   DVT prophylaxis: Lovenox code Status: Full code  family Communication: (None at bedside Disposition Plan:  . Patient came from: Home            . Anticipated d/c place: Probably home with home PT . Barriers to d/c OR conditions which need to be met to effect a safe d/c: Ongoing treatment for pneumonia and further evaluation of abdominal pain with a CT abdomen and pelvis, will probably need cardiology input for abnormal echocardiogram   Consultants:   None  Procedures: Echocardiogram Antimicrobials: (Rocephin and doxycycline since admission  Subjective: Patient reports nausea and abdominal pain since many weeks.  Objective: Vitals:   12/17/19 0000 12/17/19 0113 12/17/19 0413 12/17/19 1213  BP: 140/88 121/76 127/75 127/82  Pulse: (!) 105 89 95 100  Resp: '17 17 15   '$ Temp: 97.8 F (36.6 C)  97.7 F (36.5 C) 97.7 F (36.5 C)  TempSrc: Oral  Oral Oral  SpO2: 98% 99% 100% 95%  Weight:      Height:        Intake/Output Summary (Last 24 hours) at 12/17/2019 1418 Last data filed at 12/17/2019 0832 Gross per 24 hour  Intake 126 ml  Output 1300 ml  Net -1174 ml   Filed Weights   12/16/19 2234  Weight: 72.6 kg    Examination:  General exam: Appears calm and comfortable  Respiratory system: Clear to auscultation. Respiratory effort normal. Cardiovascular system: S1 & S2 heard, RRR. No JVD,  No pedal  edema. Gastrointestinal system: Abdomen is nondistended, soft and nontender.  Normal bowel sounds heard. Central nervous system: Alert and oriented. No focal neurological deficits. Extremities: Symmetric 5 x 5 power. Skin: No rashes, lesions or ulcers Psychiatry:Mood & affect  appropriate.     Data Reviewed: I have personally reviewed following labs and imaging studies  CBC: Recent Labs  Lab 12/16/19 1041  WBC 7.3  NEUTROABS 5.1  HGB 14.8  HCT 46.2  MCV 107.7*  PLT 790   Basic Metabolic Panel: Recent Labs  Lab 12/16/19 1041 12/17/19 0400 12/17/19 1103  NA 139 141  --   K 4.3 5.2* 4.6  CL 102 101  --   CO2 28 27  --   GLUCOSE 122* 97  --   BUN 11 12  --   CREATININE 0.91 0.81  --   CALCIUM 9.3 8.9  --    GFR: Estimated Creatinine Clearance: 88.4 mL/min (by C-G formula based on SCr of 0.81 mg/dL). Liver Function Tests: Recent Labs  Lab 12/16/19 1041  AST 33  ALT 49*  ALKPHOS 52  BILITOT 1.2  PROT 6.5  ALBUMIN 3.6   Recent Labs  Lab 12/16/19 1041  LIPASE 16   Recent Labs  Lab 12/16/19 1041  AMMONIA 31   Coagulation Profile: No results for input(s): INR, PROTIME in the last 168 hours. Cardiac Enzymes: No results for input(s): CKTOTAL, CKMB, CKMBINDEX, TROPONINI in the last 168 hours. BNP (last 3 results) No results for input(s): PROBNP in the last 8760 hours. HbA1C: No results for input(s): HGBA1C in the last 72 hours. CBG: Recent Labs  Lab 12/16/19 1041  GLUCAP 112*   Lipid Profile: No results for input(s): CHOL, HDL, LDLCALC, TRIG, CHOLHDL, LDLDIRECT in the last 72 hours. Thyroid Function Tests: Recent Labs    12/16/19 1800  TSH 1.311   Anemia Panel: No results for input(s): VITAMINB12, FOLATE, FERRITIN, TIBC, IRON, RETICCTPCT in the last 72 hours. Sepsis Labs: Recent Labs  Lab 12/16/19 1058 12/16/19 1401  LATICACIDVEN 1.3 1.4    Recent Results (from the past 240 hour(s))  SARS CORONAVIRUS 2 (TAT 6-24 HRS) Nasopharyngeal Nasopharyngeal Swab     Status: None   Collection Time: 12/16/19 12:07 PM   Specimen: Nasopharyngeal Swab  Result Value Ref Range Status   SARS Coronavirus 2 NEGATIVE NEGATIVE Final    Comment: (NOTE) SARS-CoV-2 target nucleic acids are NOT DETECTED. The SARS-CoV-2 RNA is  generally detectable in upper and lower respiratory specimens during the acute phase of infection. Negative results do not preclude SARS-CoV-2 infection, do not rule out co-infections with other pathogens, and should not be used as the sole basis for treatment or other patient management decisions. Negative results must be combined with clinical observations, patient history, and epidemiological information. The expected result is Negative. Fact Sheet for Patients: SugarRoll.be Fact Sheet for Healthcare Providers: https://www.woods-mathews.com/ This test is not yet approved or cleared by the [place] FDA and  has been authorized for detection and/or diagnosis of SARS-CoV-2 by FDA under an Emergency Use Authorization (EUA). This EUA will remain  in effect (meaning this test can be used) for the duration of the COVID-19 declaration under Section 56 4(b)(1) of the Act, 21 U.S.C. section 360bbb-3(b)(1), unless the authorization is terminated or revoked sooner. Performed at [facility], [place] [address], [place], [place] [address]          Radiology Studies: CT ABDOMEN PELVIS W CONTRAST  Result Date: 12/17/2019 CLINICAL DATA:  Nausea, vomiting, abdominal  pain. EXAM: CT ABDOMEN AND PELVIS WITH CONTRAST TECHNIQUE: Multidetector CT imaging of the abdomen and pelvis was performed using the standard protocol following bolus administration of intravenous contrast. CONTRAST:  139m OMNIPAQUE IOHEXOL 300 MG/ML  SOLN COMPARISON:  None. FINDINGS: Lower chest: Mild right pleural effusion is noted with adjacent subsegmental atelectasis. Small pericardial effusion is noted. Hepatobiliary: Hepatic steatosis is noted. No gallstones, gallbladder wall thickening, or biliary dilatation. Pancreas: Unremarkable. No pancreatic ductal dilatation or surrounding inflammatory changes. Spleen: Normal in size without focal abnormality. Adrenals/Urinary Tract: Adrenal  glands are unremarkable. Kidneys are normal, without renal calculi, focal lesion, or hydronephrosis. Bladder is unremarkable. Stomach/Bowel: Stomach is within normal limits. Appendix appears normal. No evidence of bowel wall thickening, distention, or inflammatory changes. Vascular/Lymphatic: 4 cm infrarenal abdominal aortic aneurysm is noted. Atherosclerosis of abdominal aorta is noted. No adenopathy is noted. Reproductive: Prostate is unremarkable. Other: No abdominal wall hernia or abnormality. No abdominopelvic ascites. Musculoskeletal: No acute or significant osseous findings. IMPRESSION: 1. Mild right pleural effusion is noted with adjacent subsegmental atelectasis. 2. Small pericardial effusion. 3. Hepatic steatosis. 4. 4 cm infrarenal abdominal aortic aneurysm is noted. Recommend followup by [facility] 1 year. This recommendation follows ACR consensus guidelines: White Paper of the ACR Incidental Findings Committee II on Vascular Findings. J Am Coll Radiol 2013; 10:789-794. Aortic Atherosclerosis (ICD10-I70.0). Electronically Signed   By: JMarijo ConceptionM.D.   On: 12/17/2019 14:08   [place]  Result Date: 12/16/2019 CLINICAL DATA:  History of abdominal aortic aneurysm. EXAM: ULTRASOUND OF ABDOMINAL AORTA TECHNIQUE: Ultrasound examination of the abdominal aorta and proximal common iliac arteries was performed to evaluate for aneurysm. Additional color and Doppler images of the distal aorta were obtained to document patency. COMPARISON:  None. FINDINGS: Abdominal aortic measurements as follows: Proximal:  2.3 x 3.0 cm Mid:  3.8 x 4.9 cm Distal:  1.7 x 2.4 cm Patent: Yes, peak systolic velocity is 34 cm/s Right common iliac artery: 1 cm Left common iliac artery: 1 cm IMPRESSION: Aortic atherosclerosis with fusiform aortic aneurysm measuring 4.9 x 3.8 cm in maximum orthogonal diameters and extending about 5.6 cm craniocaudal distance. Electronically Signed   By: EMisty StanleyM.D.   On: 12/16/2019 18:50   DG  Chest Portable 1 View  Result Date: 12/16/2019 CLINICAL DATA:  Increasing confusion and hypoxia EXAM: PORTABLE CHEST 1 VIEW COMPARISON:  08/27/2016 FINDINGS: Cardiac shadow is enlarged but stable. Aortic calcifications are again seen and stable. Increasing right basilar infiltrate with associated small effusion is seen. Left lung is clear. No bony abnormality is noted. IMPRESSION: Right basilar infiltrate and associated effusion consistent with acute pneumonia. Electronically Signed   By: MInez CatalinaM.D.   On: 12/16/2019 11:14   ECHOCARDIOGRAM COMPLETE  Result Date: 12/17/2019    ECHOCARDIOGRAM REPORT   Patient Name:   Cole TAWILDate of Exam: 12/17/2019 Medical Rec #:  [id number]      Height:       72.0 in Accession #:    [id number]     Weight:       160.0 lb Date of Birth:  307/02/1951      BSA:          1.938 m Patient Age:    89years        BP:           127/92 mmHg Patient Gender: M  HR:           102 bpm. Exam Location:  Inpatient Procedure: 2D Echo, 3D Echo, Color Doppler and Cardiac Doppler Indications:    X73.53 Acute diastolic (congestive) heart failure  History:        Patient has no prior history of Echocardiogram examinations.                 CHF, CAD, PAD; Risk Factors:Hypertension.  Sonographer:    Raquel Sarna Senior RDCS Referring Phys: [phone] [place]  1. Left ventricular ejection fraction, by estimation, is 15-20%. The left ventricle has severely decreased function. The left ventricle demonstrates global hypokinesis. The left ventricular internal cavity size was severely dilated. Indeterminate diastolic filling due to E-A fusion.  2. Right ventricular systolic function is low normal. The right ventricular size is mildly enlarged. There is moderately elevated pulmonary artery systolic pressure. The estimated right ventricular systolic pressure is 83.4 mmHg.  3. Left atrial size was severely dilated.  4. Right atrial size was mild to moderately dilated.  5. The  mitral valve is normal in structure. Moderate mitral valve regurgitation.  6. The aortic valve is normal in structure. Aortic valve regurgitation is not visualized.  7. The inferior vena cava is dilated in size with >50% respiratory variability, suggesting right atrial pressure of 8 mmHg. FINDINGS  Left Ventricle: Left ventricular ejection fraction, by estimation, is 15-20%. The left ventricle has severely decreased function. The left ventricle demonstrates global hypokinesis. Mild dyskinesis of the left ventricular, entire apical segment. The left ventricular internal cavity size was severely dilated. There is no left ventricular hypertrophy. Indeterminate diastolic filling due to E-A fusion. Right Ventricle: The right ventricular size is mildly enlarged. No increase in right ventricular wall thickness. Right ventricular systolic function is low normal. There is moderately elevated pulmonary artery systolic pressure. The tricuspid regurgitant  velocity is 3.05 m/s, and with an assumed right atrial pressure of 8 mmHg, the estimated right ventricular systolic pressure is 19.6 mmHg. Left Atrium: Left atrial size was severely dilated. Right Atrium: Right atrial size was mild to moderately dilated. Pericardium: A small pericardial effusion is present. The pericardial effusion is circumferential. Mitral Valve: The mitral valve is normal in structure. Moderate mitral valve regurgitation, with centrally-directed jet. Tricuspid Valve: The tricuspid valve is normal in structure. Tricuspid valve regurgitation is mild. Aortic Valve: The aortic valve is normal in structure. Aortic valve regurgitation is not visualized. Pulmonic Valve: The pulmonic valve was grossly normal. Pulmonic valve regurgitation is not visualized. Aorta: The aortic root and ascending aorta are structurally normal, with no evidence of dilitation. Venous: The inferior vena cava is dilated in size with greater than 50% respiratory variability, suggesting  right atrial pressure of 8 mmHg. IAS/Shunts: No atrial level shunt detected by color flow Doppler.  LEFT VENTRICLE PLAX 2D LVIDd:         7.50 cm LVIDs:         6.90 cm LV PW:         0.90 cm LV IVS:        0.80 cm LVOT diam:     2.30 cm LV SV:         32 LV SV Index:   16 LVOT Area:     4.15 cm  LV Volumes (MOD) LV vol d, MOD A2C: 333.0 ml LV vol d, MOD A4C: 337.0 ml LV vol s, MOD A2C: 315.0 ml LV vol s, MOD A4C: 254.0 ml LV SV MOD A2C:  18.0 ml LV SV MOD A4C:     337.0 ml LV SV MOD BP:      49.0 ml RIGHT VENTRICLE RV S prime:     10.20 cm/s TAPSE (M-mode): 1.3 cm LEFT ATRIUM              Index       RIGHT ATRIUM           Index LA diam:        3.90 cm  2.01 cm/m  RA Area:     27.20 cm LA Vol (A2C):   126.0 ml 65.02 ml/m RA Volume:   92.60 ml  47.79 ml/m LA Vol (A4C):   94.9 ml  48.97 ml/m LA Biplane Vol: 111.0 ml 57.28 ml/m  AORTIC VALVE LVOT Vmax:   72.50 cm/s LVOT Vmean:  51.300 cm/s LVOT VTI:    0.076 m  AORTA Ao Root diam: 3.70 cm Ao Asc diam:  3.00 cm MITRAL VALVE                 TRICUSPID VALVE MV Area (PHT): 5.97 cm      TR Peak grad:   37.2 mmHg MV Decel Time: 127 msec      TR Vmax:        305.00 cm/s MR Peak grad:    72.6 mmHg MR Mean grad:    44.0 mmHg   SHUNTS MR Vmax:         426.00 cm/s Systemic VTI:  0.08 m MR Vmean:        313.0 cm/s  Systemic Diam: 2.30 cm MR PISA:         1.01 cm MR PISA Eff ROA: 9 mm MR PISA Radius:  0.40 cm MV E velocity: 109.00 cm/s Dani Gobble Croitoru MD Electronically signed by Sanda Klein MD Signature Date/Time: 12/17/2019/1:42:15 PM    Final    VAS [place] LOWER EXTREMITY VENOUS (DVT) (MC and [facility] 7a-7p)  Result Date: 12/16/2019  Lower Venous DVTStudy Indications: Swelling, and SOB.  Comparison Study: no prior Performing Technologist: June Leap RDMS, RVT  Examination Guidelines: A complete evaluation includes B-mode imaging, spectral Doppler, color Doppler, and power Doppler as needed of all accessible portions of each vessel. Bilateral testing is considered an  integral part of a complete examination. Limited examinations for reoccurring indications may be performed as noted. The reflux portion of the exam is performed with the patient in reverse Trendelenburg.  +---------+---------------+---------+-----------+----------+--------------+ RIGHT    CompressibilityPhasicitySpontaneityPropertiesThrombus Aging +---------+---------------+---------+-----------+----------+--------------+ CFV      Full           Yes      Yes                                 +---------+---------------+---------+-----------+----------+--------------+ SFJ      Full                                                        +---------+---------------+---------+-----------+----------+--------------+ FV Prox  Full                                                        +---------+---------------+---------+-----------+----------+--------------+  FV Mid   Full                                                        +---------+---------------+---------+-----------+----------+--------------+ FV DistalFull                                                        +---------+---------------+---------+-----------+----------+--------------+ PFV      Full                                                        +---------+---------------+---------+-----------+----------+--------------+ POP      Full           Yes      Yes                                 +---------+---------------+---------+-----------+----------+--------------+ PTV      Full                                                        +---------+---------------+---------+-----------+----------+--------------+ PERO     Full                                                        +---------+---------------+---------+-----------+----------+--------------+   +---------+---------------+---------+-----------+----------+--------------+ LEFT     CompressibilityPhasicitySpontaneityPropertiesThrombus  Aging +---------+---------------+---------+-----------+----------+--------------+ CFV      Full           Yes      Yes                                 +---------+---------------+---------+-----------+----------+--------------+ SFJ      Full                                                        +---------+---------------+---------+-----------+----------+--------------+ FV Prox  Full                                                        +---------+---------------+---------+-----------+----------+--------------+ FV Mid   Full                                                        +---------+---------------+---------+-----------+----------+--------------+  FV DistalFull                                                        +---------+---------------+---------+-----------+----------+--------------+ PFV      Full                                                        +---------+---------------+---------+-----------+----------+--------------+ POP      Full           Yes      Yes                                 +---------+---------------+---------+-----------+----------+--------------+ PTV      Full                                                        +---------+---------------+---------+-----------+----------+--------------+ PERO     Full                                                        +---------+---------------+---------+-----------+----------+--------------+     Summary: RIGHT: - There is no evidence of deep vein thrombosis in the lower extremity.  - No cystic structure found in the popliteal fossa. - Pulsatile venous flow suggests increased right sided heart pressure.  LEFT: - There is no evidence of deep vein thrombosis in the lower extremity.  - A cystic structure is found in the popliteal fossa. - Pulsatile venous flow suggests increased right sided heart pressure.  *See table(s) above for measurements and observations. Electronically signed  by Curt Jews MD on 12/16/2019 at 3:13:21 PM.    Final         Scheduled Meds: . aspirin  81 mg Oral Daily  . doxycycline  100 mg Oral Q12H  . enoxaparin (LOVENOX) injection  40 mg Subcutaneous Q24H  . furosemide  40 mg Intravenous BID  . nicotine  21 mg Transdermal Daily  . pantoprazole  40 mg Oral Q0600  . sodium chloride flush  3 mL Intravenous Q12H  . sucralfate  1 g Oral TID WC & HS   Continuous Infusions: . sodium chloride    . cefTRIAXone (ROCEPHIN)  IV 2 g (12/17/19 1254)     LOS: 1 day        Hosie Poisson, MD [facility]   To contact the attending provider between 7A-7P or the covering provider during after hours 7P-7A, please log into the web site [email or web address] and access using universal [place] password for that web site. If you do not have the password, please call the hospital operator.  12/17/2019, 2:18 PM

## 2019-12-17 NOTE — Evaluation (Signed)
Physical Therapy Evaluation Patient Details Name: Cole Fuller MRN: [id number] DOB: November 01, 1950 Today's Date: 12/17/2019   History of Present Illness  69 y.o. male with medical history significant of CAD s/p stent in 2004, HTN, PAD s/p stent , AAA, anxiety, arthritis, tobacco abuse, and chronic pain presents after being found to be altered 4/8. Woke up disoriented, unable to walk w/ abdominal pain. Admitted with acute CHF exacerbation  Clinical Impression   Pt admitted with above diagnosis. PTA was living home with spouse, reports being completely independent and not use DME/ADs. Pt currently with functional limitations due to the deficits listed below (see PT Problem List). Pt was found sitting on bed, asked to put on socks and needed extremely long time to donn both taking increased rest breajs between, VSS but pt looks visibly winded. Pt able to complete transfers with min a and ambulated approx 23ft in room with min/min guard assist. Pt initially on 3L/min vis [place] but was able to titrate down to 2L/min w/ sats remaining in 90s. Pt will benefit from skilled PT to increase their independence and safety with mobility to allow discharge to the venue listed below.       Follow Up Recommendations Home health PT;Supervision for mobility/OOB    Equipment Recommendations  Other (comment)(TBD)    Recommendations for Other Services       Precautions / Restrictions Precautions Precautions: Fall Restrictions Weight Bearing Restrictions: No      Mobility  Bed Mobility Overal bed mobility: Needs Assistance Bed Mobility: Supine to Sit     Supine to sit: Supervision     General bed mobility comments: able to get to long sit with mod I  Transfers Overall transfer level: Needs assistance Equipment used: None Transfers: Sit to/from Stand Sit to Stand: Min guard            Ambulation/Gait Ambulation/Gait assistance: Min guard Gait Distance (Feet): 20 Feet Assistive device:  None Gait Pattern/deviations: Step-through pattern Gait velocity: slowed      Stairs            Wheelchair Mobility    Modified Rankin (Stroke Patients Only)       Balance Overall balance assessment: Needs assistance Sitting-balance support: Feet supported Sitting balance-Leahy Scale: Good     Standing balance support: During functional activity Standing balance-Leahy Scale: Fair Standing balance comment: able to bend over and pick up small pill like object from floor w/o loss of balance                             Pertinent Vitals/Pain Pain Assessment: No/denies pain    Home Living Family/patient expects to be discharged to:: Private residence Living Arrangements: Spouse/significant other Available Help at Discharge: Family Type of Home: House Home Access: Stairs to enter Entrance Stairs-Rails: Can reach both Entrance Stairs-Number of Steps: 3 Home Layout: One level Home Equipment: Grab bars - tub/shower;Grab bars - toilet;Shower seat      Prior Function Level of Independence: Independent               Hand Dominance   Dominant Hand: Right    Extremity/Trunk Assessment   Upper Extremity Assessment Upper Extremity Assessment: Generalized weakness    Lower Extremity Assessment Lower Extremity Assessment: Generalized weakness    Cervical / Trunk Assessment Cervical / Trunk Assessment: Kyphotic  Communication   Communication: HOH  Cognition Arousal/Alertness: Lethargic Behavior During Therapy: WFL for tasks assessed/performed Overall Cognitive Status:  No family/caregiver present to determine baseline cognitive functioning                                        General Comments General comments (skin integrity, edema, etc.): initially on 3L/min via [place], sats in high 90s, titrated to 2L/min and sats in mid-low 90s.    Exercises     Assessment/Plan    PT Assessment Patient needs continued PT services  PT  Problem List Decreased strength;Decreased activity tolerance;Decreased mobility;Decreased balance;Decreased coordination;Decreased knowledge of use of DME;Decreased safety awareness       PT Treatment Interventions Gait training;Stair training;Functional mobility training;Therapeutic activities;Therapeutic exercise;Balance training;Neuromuscular re-education;Patient/family education    PT Goals (Current goals can be found in the Care Plan section)  Acute Rehab PT Goals Patient Stated Goal: get stronger PT Goal Formulation: With patient Time For Goal Achievement: 12/31/19 Potential to Achieve Goals: Good    Frequency Min 3X/week   Barriers to discharge        Co-evaluation               AM-PAC PT "6 Clicks" Mobility  Outcome Measure Help needed turning from your back to your side while in a flat bed without using bedrails?: None Help needed moving from lying on your back to sitting on the side of a flat bed without using bedrails?: None Help needed moving to and from a bed to a chair (including a wheelchair)?: A Little Help needed standing up from a chair using your arms (e.g., wheelchair or bedside chair)?: A Little Help needed to walk in hospital room?: A Little Help needed climbing 3-5 steps with a railing? : A Lot 6 Click Score: 19    End of Session Equipment Utilized During Treatment: Oxygen Activity Tolerance: Patient limited by fatigue;Patient tolerated treatment well;Patient limited by lethargy Patient left: in chair;with call bell/phone within reach   PT Visit Diagnosis: Other abnormalities of gait and mobility (R26.89);Muscle weakness (generalized) (M62.81)    Time: 5366-4403 PT Time Calculation (min) (ACUTE ONLY): 24 min   Charges:   PT Evaluation $PT Eval Moderate Complexity: 1 Mod PT Treatments $Therapeutic Activity: 8-22 mins        Drema Pry, PT   Freddi Starr 12/17/2019, 10:26 AM

## 2019-12-17 NOTE — Consult Note (Addendum)
Cardiology Consultation:   Patient ID: Cole Fuller; [id number]; 1950/09/18   Admit date: 12/16/2019 Date of Consult: 12/17/2019  Primary Care Provider: Eber Hong, MD Primary Cardiologist: New to [facility]  Patient Profile:   Cole Fuller is a 69 y.o. male with a hx of CAD with remote LCx PCI in 2004 per Dr. Allyson Sabal (recath 09/2004 with no intervention), hypertension, PAD status post PCI per Dr. Allyson Sabal in 2004, AAA, anxiety, arthritis, tobacco abuse and chronic pain who is being seen today for the evaluation of CHF at the request of Dr. Blake Divine.  History of Present Illness:   Cole Fuller is a 69 year old male with a history as stated above who presented to [facility] on 04/2020 with altered mental status. Per chart review, patient had been altered for several days prior to hospital presentation. He has a long history of chronic back pain for which he is followed by pain management clinic. He was recently switched from oxycodone to Suboxone 8 mg 3 times daily however did not tolerate. He has a past history of pain medication over use. On morning of presentation (per chart review as the patient cannot recall the events which lead to this hospitalization), patient was unable to ambulate and was disoriented with complaints of abdominal pain therefore EMS was called and he was transported to the ED for further evaluation. On my assessment, he denies chest pain. He does report that for the last several weeks he has been experiencing SOB both with rest and with exertion. He states he has not had LE swelling, orthopnea symptoms, dizziness, palpitations or syncope. He states that he has not followed with cardiology for many years. He continues to smoke everyday and reports his only medications that he takes on a regular basis is pain medication.    In the ED, O2 saturations were found to be 83% on room air which improved after 2 L nasal cannula placement.  BNP was found to be >4500.  High-sensitivity troponin was  81 with subsequent lab work at 80, flat trend not necessarily consistent with ACS.  CXR with right basilar infiltrate with associated effusion concerning for acute pneumonia.  He was given 40 mg IV Lasix and started on empiric antibiotics with Rocephin and azithromycin.  He was admitted to hospitalist service at which time Lasix IV 40 mg twice daily was initiated.  An echocardiogram was performed however results are pending. Lower extremity vascular study was performed to rule out DVT which were found to be negative.   He was remotely followed by Dr. Darrick Penna, last seen 12/28/2015 for the evaluation of bilateral leg pain.  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  Ultrasound after that procedure showed a small abdominal aortic aneurysm measuring 3.7 cm in diameter. According to the patient's history he had a right superficial femoral artery stent placed by Dr. Allyson Sabal in 2004. He also had a coronary stent placed in 2004 by Dr. Allyson Sabal.Plan at that time was to continue with risk factor modification and smoking cessation. Plan was to follow-up of his abdominal aortic aneurysm with plans for repeat ABIs in 6 months. Unfortunately, he has not been seen since this time.  He was then admitted 03/16/2016 presenting with chronic back pain felt to be secondary to presumed inferior MI with onset of symptoms greater than 48 hours. Initial troponin was 13 and Q waves found in the  anterior region of EKG.   He has known CAD s/p PCI of the circumflex. He also has a known chronic and stable 95% distal small codominant RCA stenosis. 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 He had a coronary stress test performed 09/2004 which showed mild inferior ischemia consistent with his known anatomy without evidence of ischemia in the circumflex or LAD distribution. His last cath was performed  pre-operatively in 2012 which showed no evidence of restenosis within his circumflex stents.  RCA remained stable and was felt to possibly be responsible for inferior ischemic abnormality on stress test results.  He was cleared for surgery.  It was noted that if pain becomes more frequent or severe, he could possibly be a candidate for PCI of his RCA.   Past Medical History:  Diagnosis Date  . Anxiety   . Arthritis   . Arthritis pain   . Back pain   . Bruises easily   . Chills   . Coronary artery disease    with stenting in 2004, sees Dr Gwenlyn Found  . Hypertension   . Inguinal hernia    right  . Inguinal hernia    Right  . Peripheral vascular disease [facility])     Past Surgical History:  Procedure Laterality Date  . CARDIAC CATHETERIZATION  2004   with stents x2  . FRACTURE SURGERY     left foot  . heart stents    . INGUINAL HERNIA REPAIR  01/10/2012   Procedure: HERNIA REPAIR INGUINAL ADULT;  Surgeon: Gwenyth Ober, MD;  Location: [place];  Service: General;  Laterality: Right;  . KNEE ARTHROSCOPY     Right  . ORTHOPEDIC SURGERY    . PERIPHERAL VASCULAR CATHETERIZATION N/A 09/15/2015   Procedure: Abdominal Aortogram;  Surgeon: Elam Dutch, MD;  Location: [place] [facility];  Service: Cardiovascular;  Laterality: N/A;  . stents in leg     right     Prior to Admission medications   Medication Sig Start Date End Date Taking? Authorizing Provider  alprazolam Duanne Moron) 2 MG tablet Take 2 mg by mouth 4 (four) times daily. For anxiety   Yes [provider]  nitroGLYCERIN (NITROSTAT) 0.4 MG SL tablet Place 1 tablet (0.4 mg total) under the tongue every 5 (five) minutes as needed for chest pain. 03/17/16  Yes Means, Lolita Cram, MD  oxyCODONE-acetaminophen (PERCOCET) 10-325 MG tablet Take 1 tablet by mouth every 6 (six) hours as needed for pain.  02/24/16  Yes [provider]  ibuprofen (ADVIL,MOTRIN) 600 MG tablet Take 1 tablet (600 mg total) by mouth every 6 (six) hours as  needed. Patient not taking: Reported on 03/16/2016 02/09/16   Leo Grosser, MD  methocarbamol (ROBAXIN) 500 MG tablet Take 1 tablet (500 mg total) by mouth 2 (two) times daily. Patient not taking: Reported on 03/16/2016 03/08/16   Anne Ng, PA-C    Inpatient Medications: Scheduled Meds: . aspirin  81 mg Oral Daily  . doxycycline  100 mg Oral Q12H  . enoxaparin (LOVENOX) injection  40 mg Subcutaneous Q24H  . furosemide  40 mg Intravenous BID  . nicotine  21 mg Transdermal Daily  . pantoprazole  40 mg Oral Q0600  . sodium chloride flush  3 mL Intravenous Q12H  . sucralfate  1 g Oral TID WC & HS   Continuous Infusions: . sodium chloride    . cefTRIAXone (ROCEPHIN)  IV     PRN Meds: sodium chloride, acetaminophen, alprazolam,  oxyCODONE-acetaminophen **AND** oxyCODONE, sodium chloride flush  Allergies:    Allergies  Allergen Reactions  . Benicar Hct [Olmesartan Medoxomil-Hctz] Shortness Of Breath  . Butrans [Buprenorphine] Diarrhea and Nausea And Vomiting  . Nadolol Shortness Of Breath  . Morphine And Related Hives and Itching  . Prednisone Other (See Comments)    Chest and throat pain    Social History:   Social History   Socioeconomic History  . Marital status: Married    Spouse name: Not on file  . Number of children: Not on file  . Years of education: Not on file  . Highest education level: Not on file  Occupational History  . Not on file  Tobacco Use  . Smoking status: Current Every Day Smoker    Packs/day: 0.50    Types: Cigarettes  Substance and Sexual Activity  . Alcohol use: No    Alcohol/week: 0.0 standard drinks  . Drug use: No  . Sexual activity: Not on file  Other Topics Concern  . Not on file  Social History Narrative  . Not on file   Social Determinants of Health   Financial Resource Strain:   . Difficulty of Paying Living Expenses:   Food Insecurity:   . Worried About Programme researcher, broadcasting/film/video in the Last Year:   . Barista in the Last  Year:   Transportation Needs:   . Freight forwarder (Medical):   Marland Kitchen Lack of Transportation (Non-Medical):   Physical Activity:   . Days of Exercise per Week:   . Minutes of Exercise per Session:   Stress:   . Feeling of Stress :   Social Connections:   . Frequency of Communication with Friends and Family:   . Frequency of Social Gatherings with Friends and Family:   . Attends Religious Services:   . Active Member of Clubs or Organizations:   . Attends Banker Meetings:   Marland Kitchen Marital Status:   Intimate Partner Violence:   . Fear of Current or Ex-Partner:   . Emotionally Abused:   Marland Kitchen Physically Abused:   . Sexually Abused:     Family History:   No family history on file. Family Status:  Family Status  Relation Name Status  . Mother  Deceased  . Father  Deceased    ROS:  Please see the history of present illness.  All other ROS reviewed and negative.     Physical Exam/Data:   Vitals:   12/16/19 2328 12/17/19 0000 12/17/19 0113 12/17/19 0413  BP: (!) 145/87 140/88 121/76 127/75  Pulse: (!) 112 (!) 105 89 95  Resp: (!) 28 17 17 15   Temp:  97.8 F (36.6 C)  97.7 F (36.5 C)  TempSrc:  Oral  Oral  SpO2: 98% 98% 99% 100%  Weight:      Height:        Intake/Output Summary (Last 24 hours) at 12/17/2019 1151 Last data filed at 12/17/2019 0832 Gross per 24 hour  Intake 476 ml  Output 1300 ml  Net -824 ml   Filed Weights   12/16/19 2234  Weight: 72.6 kg   Body mass index is 21.7 kg/m.   General: Elderly, older than stated age, NAD  Head: Normocephalic, atraumatic, sclera non-icteric, no xanthomas, clear, moist mucus membranes. Neck: Negative for carotid bruits. No JVD Lungs: Diminished in bilateral lower lobes. Breathing is labored. Cardiovascular: RRR with S1 S2. No murmurs Abdomen: Soft, non-tender, non-distended. No obvious abdominal masses. Extremities: 1+ edema. Radial  pulses 2+ bilaterally Neuro: Alert and oriented. No focal deficits. No  facial asymmetry. MAE spontaneously. Psych: Responds to questions appropriately with normal affect.    EKG:  The EKG was personally reviewed and demonstrates: 12/16/19 Sinus tachycardia with RBBB (new) and lateral ST depression in anterolateral regions Telemetry:  Telemetry was personally reviewed and demonstrates: 12/17/19 ST with rates in 100's   Relevant CV Studies:  CATH 09/2004:   1. Left main normal. 2. LAD: The LAD had, at most, 40% segmental proximal stenosis. 3. Left circumflex: The two previously placed stents were widely patent.  The OM branch which arose between the two stents was widely patent, as  well. There were minor irregularities in the distal circumflex. 4. Right coronary artery: Small codominant vessel with a stable 90% distal  stenosis just proximal to the genu of the vessel. 5. Left ventriculography: Left ventriculogram was performed using 25 mL of  Visipaque dye at 12 mL per second. The overall LVEF is estimated at  greater than 60% without focal wall motion abnormalities.   IMPRESSION:  Mr. [name] has stable CAD with no evidence of restenosis within  his circumflex stents.  His right coronary artery stenosis has remained  stable and is probably responsible for his inferior ischemic abnormality and  Cardiolite and his stable chest pain.  I believe he is at low risk to  undergo his upcoming surgical procedure tomorrow.  If his pain becomes more  frequent or severe, he is a candidate for PCI of his RCA.   The sheath was removed and pressure was held on the groin to achieve  hemostasis.  The patient left the lab in stable condition.  He will be  discharged home as an outpatient.  Echo 12/17/19:  1. Left ventricular ejection fraction, by estimation, is 15-20%. The left  ventricle has severely decreased function. The left ventricle demonstrates  global hypokinesis. The left ventricular internal cavity size was severely  dilated.  Indeterminate  diastolic filling due to E-A fusion.  2. Right ventricular systolic function is low normal. The right  ventricular size is mildly enlarged. There is moderately elevated  pulmonary artery systolic pressure. The estimated right ventricular  systolic pressure is 45.2 mmHg.  3. Left atrial size was severely dilated.  4. Right atrial size was mild to moderately dilated.  5. The mitral valve is normal in structure. Moderate mitral valve  regurgitation.  6. The aortic valve is normal in structure. Aortic valve regurgitation is  not visualized.  7. The inferior vena cava is dilated in size with >50% respiratory  variability, suggesting right atrial pressure of 8 mmHg.   Laboratory Data:  Chemistry Recent Labs  Lab 12/16/19 1041 12/17/19 0400  NA 139 141  K 4.3 5.2*  CL 102 101  CO2 28 27  GLUCOSE 122* 97  BUN 11 12  CREATININE 0.91 0.81  CALCIUM 9.3 8.9  GFRNONAA >60 >60  GFRAA >60 >60  ANIONGAP 9 13    Total Protein  Date Value Ref Range Status  12/16/2019 6.5 6.5 - 8.1 g/dL Final   Albumin  Date Value Ref Range Status  12/16/2019 3.6 3.5 - 5.0 g/dL Final   AST  Date Value Ref Range Status  12/16/2019 33 15 - 41 U/L Final   ALT  Date Value Ref Range Status  12/16/2019 49 (H) 0 - 44 U/L Final   Alkaline Phosphatase  Date Value Ref Range Status  12/16/2019 52 38 - 126 U/L Final   Total Bilirubin  Date Value Ref Range Status  Jan 08, 2020 1.2 0.3 - 1.2 mg/dL Final   Hematology Recent Labs  Lab 01/08/20 1041  WBC 7.3  RBC 4.29  HGB 14.8  HCT 46.2  MCV 107.7*  MCH 34.5*  MCHC 32.0  RDW 14.2  PLT 211   Cardiac EnzymesNo results for input(s): TROPONINI in the last 168 hours. No results for input(s): TROPIPOC in the last 168 hours.  BNP Recent Labs  Lab 08-Jan-2020 1041  BNP >4,500.0*    DDimer No results for input(s): DDIMER in the last 168 hours. TSH:  Lab Results  Component Value Date   TSH 1.311 2020/01/08   Lipids:No results  found for: CHOL, HDL, LDLCALC, LDLDIRECT, TRIG, CHOLHDL HgbA1c: Lab Results  Component Value Date   HGBA1C 5.5 03/16/2016    Radiology/Studies:  US AORTA  Result Date: Jan 08, 2020 CLINICAL DATA:  History of abdominal aortic aneurysm. EXAM: ULTRASOUND OF ABDOMINAL AORTA TECHNIQUE: Ultrasound examination of the abdominal aorta and proximal common iliac arteries was performed to evaluate for aneurysm. Additional color and Doppler images of the distal aorta were obtained to document patency. COMPARISON:  None. FINDINGS: Abdominal aortic measurements as follows: Proximal:  2.3 x 3.0 cm Mid:  3.8 x 4.9 cm Distal:  1.7 x 2.4 cm Patent: Yes, peak systolic velocity is 34 cm/s Right common iliac artery: 1 cm Left common iliac artery: 1 cm IMPRESSION: Aortic atherosclerosis with fusiform aortic aneurysm measuring 4.9 x 3.8 cm in maximum orthogonal diameters and extending about 5.6 cm craniocaudal distance. Electronically Signed   By: Kennith Center M.D.   On: Jan 08, 2020 18:50   DG Chest Portable 1 View  Result Date: 01-08-2020 CLINICAL DATA:  Increasing confusion and hypoxia EXAM: PORTABLE CHEST 1 VIEW COMPARISON:  08/27/2016 FINDINGS: Cardiac shadow is enlarged but stable. Aortic calcifications are again seen and stable. Increasing right basilar infiltrate with associated small effusion is seen. Left lung is clear. No bony abnormality is noted. IMPRESSION: Right basilar infiltrate and associated effusion consistent with acute pneumonia. Electronically Signed   By: Alcide Clever M.D.   On: 01/08/2020 11:14   VAS [place] LOWER EXTREMITY VENOUS (DVT) (MC and WL 7a-7p)  Result Date: Jan 08, 2020  Lower Venous DVTStudy Indications: Swelling, and SOB.  Comparison Study: no prior Performing Technologist: Jeb Levering RDMS, RVT  Examination Guidelines: A complete evaluation includes B-mode imaging, spectral Doppler, color Doppler, and power Doppler as needed of all accessible portions of each vessel. Bilateral testing is  considered an integral part of a complete examination. Limited examinations for reoccurring indications may be performed as noted. The reflux portion of the exam is performed with the patient in reverse Trendelenburg.  +---------+---------------+---------+-----------+----------+--------------+ RIGHT    CompressibilityPhasicitySpontaneityPropertiesThrombus Aging +---------+---------------+---------+-----------+----------+--------------+ CFV      Full           Yes      Yes                                 +---------+---------------+---------+-----------+----------+--------------+ SFJ      Full                                                        +---------+---------------+---------+-----------+----------+--------------+ FV Prox  Full                                                        +---------+---------------+---------+-----------+----------+--------------+  FV Mid   Full                                                        +---------+---------------+---------+-----------+----------+--------------+ FV DistalFull                                                        +---------+---------------+---------+-----------+----------+--------------+ PFV      Full                                                        +---------+---------------+---------+-----------+----------+--------------+ POP      Full           Yes      Yes                                 +---------+---------------+---------+-----------+----------+--------------+ PTV      Full                                                        +---------+---------------+---------+-----------+----------+--------------+ PERO     Full                                                        +---------+---------------+---------+-----------+----------+--------------+   +---------+---------------+---------+-----------+----------+--------------+ LEFT      CompressibilityPhasicitySpontaneityPropertiesThrombus Aging +---------+---------------+---------+-----------+----------+--------------+ CFV      Full           Yes      Yes                                 +---------+---------------+---------+-----------+----------+--------------+ SFJ      Full                                                        +---------+---------------+---------+-----------+----------+--------------+ FV Prox  Full                                                        +---------+---------------+---------+-----------+----------+--------------+ FV Mid   Full                                                        +---------+---------------+---------+-----------+----------+--------------+  FV DistalFull                                                        +---------+---------------+---------+-----------+----------+--------------+ PFV      Full                                                        +---------+---------------+---------+-----------+----------+--------------+ POP      Full           Yes      Yes                                 +---------+---------------+---------+-----------+----------+--------------+ PTV      Full                                                        +---------+---------------+---------+-----------+----------+--------------+ PERO     Full                                                        +---------+---------------+---------+-----------+----------+--------------+     Summary: RIGHT: - There is no evidence of deep vein thrombosis in the lower extremity.  - No cystic structure found in the popliteal fossa. - Pulsatile venous flow suggests increased right sided heart pressure.  LEFT: - There is no evidence of deep vein thrombosis in the lower extremity.  - A cystic structure is found in the popliteal fossa. - Pulsatile venous flow suggests increased right sided heart pressure.  *See table(s) above  for measurements and observations. Electronically signed by Gretta Began MD on 12/16/2019 at 3:13:21 PM.    Final    Assessment and Plan:   1.  Acute systolic CHF exacerbation: -Patient presented with altered mental status (per chart review) with acute metabolic encephalopathy felt to be possibly secondary to oxycodone use.  On hospital presentation, patient found to be fluid volume overloaded with a BNP of greater than 4500 and subsequent CXR with right-sided pleural effusion with possible infiltrates. His O2 saturations were suboptimal at 83% which improved with supplemental O2. -Echocardiogram performed 12/17/19 with LVEF at 15-20% with global hypokinesis of the LV and entire apical segment, moderate MR and mild TR -He has a PMH of CAD with remote stent placement in 2004 and very poor follow up. He reports a 2 week hx of dyspnea on hospital presentation with mild LE edema.  -CT shows mild right pleural effusion with atelectasis and small pericardial effusion is noted -Creatinine stable at 0.81 -Will plan to continue IV Lasix  BID and follow volume status -Weight, 160lb today with no prior weight on file  -I&O, net negative -Daily BMET and strict I&), daily weights  -Given severe LV dysfunction, will plan to optimize HF medications. Unfortunately medication choices are limited by  ARB allergy with SOB. Will discuss regimen plan with MD. -May need EP consultation if felt to be AICD candidate in the OP setting given his high risk for VT arrest. I am concerned about poor medical compliance and follow up at this point. During my assessment, pt was adament to leave today or tomorrow.   2.  Community-acquired pneumonia: -CXR concerning for right lower lobe infiltrate possibly secondary to aspiration p -Patient started on empiric antibiotics>>> initially on Rocephin however this was transitioned to doxycycline secondary to prolonged QT interval -Management per primary team  3.  Elevated troponin  with known hx of CAD: -High-sensitivity troponin found to be 81 with subsequent level at 80, not necessarily consistent with ACS -Elevation likely in the setting of acute fluid volume overload -Has past hx of CAD s/p PCI of the circumflex. He also has a known chronic and stable 95% distal small codominant RCA stenosis. -Stress test performed 09/2004 which showed mild inferior ischemia consistent with his known anatomy without evidence of ischemia in the circumflex or LAD distribution.  -Last LHC performed pre-operatively in 2006 showed no evidence of restenosis within his circumflex stents. RCA remained stable and was felt to possibly be responsible for inferior ischemic abnormality on stress test results. It was noted that if pain becomes more frequent or severe, he could possibly be a candidate for PCI of his RCA.  -Denies chest pain -Continue ASA, statin, beta-blocker  4.  History of peripheral arterial disease: -Followed remotely with Dr. Truitt Leep. Allyson Sabal -Has past hx of right superficial femoral artery stent placed by Dr. Allyson Sabal in 2004>>last seen by Dr. Darrick Penna for claudication symptoms at which time plan was to continue with risk factor modification and smoking cessation and repeat ABIs in 6 months.  Unfortunately, he has not been seen since this time.  5.  History of AAA: -Ultrasound SFA stent placement showed a small abdominal aortic aneurysm measuring 3.7 cm in diameter. Plan was to follow closely with serial studies however he has not been seen by our service or vascular team since that time.  -He has complaints of abdominal pain on presentation with bruit per primary team's note -CT performed which shows AAA at 4cm in diameter. -Continue with close follow up and adequate BP control   6. Tobacco abuse: -Continues to smoke everyday  -Cessation strongly encouraged however patient has no intention of quitting   7. Chronic AAA ( previously measuring 3.7 cm): - Palpable on exam,  non-tender.  No abdominal or additional symptoms to suggest change.  Duplex 12/2015 stable.    8. HTN: -Stable, 127/82>127/75>121/76 -Not currently in antihypertensive therapies   For questions or updates, please contact [facility] Please consult [email or web address] for contact info under Cardiology/STEMI.   Raliegh Ip NP-C [facility] Pager: [phone] 12/17/2019 11:51 AM  Patient seen and examined with Georgie Chard NP-C.  Agree as above, with the following exceptions and changes as noted below. Patient is currently chest pain free and has no increased work of breathing  . Gen: NAD, CV: regular, tachycardic, no murmurs, Lungs: clear, Abd: soft, Extrem: 1+ edema, Neuro/Psych: alert and oriented x 3, normal mood and affect. All available labs, radiology testing, previous records reviewed. Continue diuresis as above, will consider discussion with patient over weekend about R/L heart cath pending response to diuresis and overall plan for care. Losartan can be trialed, stop with any shortness of breath.  Parke Poisson

## 2019-12-17 NOTE — Progress Notes (Signed)
Echocardiogram 2D Echocardiogram has been performed.  Warren Lacy Ostin Mathey 12/17/2019, 9:43 AM

## 2019-12-18 DIAGNOSIS — F4321 Adjustment disorder with depressed mood: Secondary | ICD-10-CM

## 2019-12-18 LAB — BASIC METABOLIC PANEL
Anion gap: 14 (ref 5–15)
BUN: 12 mg/dL (ref 8–23)
CO2: 30 mmol/L (ref 22–32)
Calcium: 9.2 mg/dL (ref 8.9–10.3)
Chloride: 95 mmol/L — ABNORMAL LOW (ref 98–111)
Creatinine, Ser: 0.84 mg/dL (ref 0.61–1.24)
GFR calc Af Amer: >60 mL/min (ref >60)
GFR calc non Af Amer: >60 mL/min (ref >60)
Glucose, Bld: 97 mg/dL (ref 70–99)
Potassium: 3.5 mmol/L (ref 3.5–5.1)
Sodium: 139 mmol/L (ref 135–145)

## 2019-12-18 LAB — MAGNESIUM: Magnesium: 1.6 mg/dL — ABNORMAL LOW (ref 1.7–2.4)

## 2019-12-18 MED ORDER — TRAZODONE HCL 100 MG PO TABS
100.0000 mg | ORAL_TABLET | Freq: Every day | ORAL | Status: DC
  Administered 2019-12-18 – 2019-12-20 (×3): 100 mg via ORAL
  Filled 2019-12-18 (×3): qty 1

## 2019-12-18 MED ORDER — POTASSIUM CHLORIDE CRYS ER 20 MEQ PO TBCR: 40.0000 meq | EXTENDED_RELEASE_TABLET | Freq: Every day | ORAL | Status: DC

## 2019-12-18 MED ORDER — ALPRAZOLAM 0.5 MG PO TABS: 1.0000 mg | ORAL_TABLET | Freq: Four times a day (QID) | ORAL | Status: DC | PRN

## 2019-12-18 MED ORDER — POTASSIUM CHLORIDE CRYS ER 20 MEQ PO TBCR
40.0000 meq | EXTENDED_RELEASE_TABLET | Freq: Once | ORAL | Status: AC
  Administered 2019-12-18: 10:00:00 40 meq via ORAL
  Filled 2019-12-18: qty 2

## 2019-12-18 MED ORDER — OXYCODONE-ACETAMINOPHEN 5-325 MG PO TABS
1.0000 | ORAL_TABLET | ORAL | Status: DC | PRN
  Administered 2019-12-18 – 2019-12-21 (×8): 2 via ORAL
  Filled 2019-12-18: qty 2
  Filled 2019-12-18: qty 1
  Filled 2019-12-18 (×3): qty 2
  Filled 2019-12-18: qty 1
  Filled 2019-12-18 (×3): qty 2

## 2019-12-18 MED ORDER — ALPRAZOLAM 0.5 MG PO TABS
1.0000 mg | ORAL_TABLET | Freq: Three times a day (TID) | ORAL | Status: DC | PRN
  Administered 2019-12-18 – 2019-12-21 (×7): 1 mg via ORAL
  Filled 2019-12-18 (×8): qty 2

## 2019-12-18 NOTE — Progress Notes (Signed)
Progress Note  Patient Name: Cole Fuller Date of Encounter: 12/18/2019  Primary Cardiologist: No primary care provider on file.   Subjective   Diuresing well. Drowsy after receiving alprazolam 1 mg.  Inpatient Medications    Scheduled Meds: . aspirin  81 mg Oral Daily  . doxycycline  100 mg Oral Q12H  . enoxaparin (LOVENOX) injection  40 mg Subcutaneous Q24H  . furosemide  40 mg Intravenous BID  . losartan  12.5 mg Oral Daily  . nicotine  21 mg Transdermal Daily  . pantoprazole  40 mg Oral Q0600  . sodium chloride flush  3 mL Intravenous Q12H  . sucralfate  1 g Oral TID WC & HS   Continuous Infusions: . sodium chloride    . cefTRIAXone (ROCEPHIN)  IV 2 g (12/17/19 1254)   PRN Meds: sodium chloride, acetaminophen, alprazolam, oxyCODONE-acetaminophen, sodium chloride flush   Vital Signs    Vitals:   12/17/19 1606 12/17/19 1954 12/18/19 0325 12/18/19 0358  BP: 126/82 133/87  129/81  Pulse: (!) 107 93  (!) 101  Resp:  (!) 21  19  Temp: (!) 97.5 F (36.4 C) 98 F (36.7 C)  97.6 F (36.4 C)  TempSrc: Oral Oral  Oral  SpO2: 92% 99%  98%  Weight:   69.8 kg   Height:        Intake/Output Summary (Last 24 hours) at 12/18/2019 1133 Last data filed at 12/18/2019 1000 Gross per 24 hour  Intake 1366 ml  Output 1980 ml  Net -614 ml   Last 3 Weights 12/18/2019 12/16/2019 03/16/2016  Weight (lbs) 153 lb 14.1 oz 160 lb 167 lb 5.3 oz  Weight (kg) 69.8 kg 72.576 kg 75.9 kg      Telemetry    ECG    SR, IVCD yesterday - Personally Reviewed  Physical Exam   GEN: No acute distress.   Neck: JVP to mid 1/3 of neck at 90 deg Cardiac: RRR, no murmurs, rubs, or gallops.  Respiratory: crackles bilaterally. GI: Soft, nontender, non-distended  MS: 1+ pedal edema; No deformity. Neuro:  Nonfocal  Psych: drowsy  Labs    High Sensitivity Troponin:   Recent Labs  Lab 12/16/19 1041 12/16/19 1401  TROPONINIHS 81* 80*      Chemistry Recent Labs  Lab 12/16/19 1041  12/16/19 1041 12/17/19 0400 12/17/19 1103 12/18/19 0331  NA 139  --  141  --  139  K 4.3   < > 5.2* 4.6 3.5  CL 102  --  101  --  95*  CO2 28  --  27  --  30  GLUCOSE 122*  --  97  --  97  BUN 11  --  12  --  12  CREATININE 0.91  --  0.81  --  0.84  CALCIUM 9.3  --  8.9  --  9.2  PROT 6.5  --   --   --   --   ALBUMIN 3.6  --   --   --   --   AST 33  --   --   --   --   ALT 49*  --   --   --   --   ALKPHOS 52  --   --   --   --   BILITOT 1.2  --   --   --   --   GFRNONAA >60  --  >60  --  >60  GFRAA >60  --  >  60  --  >60  ANIONGAP 9  --  13  --  14   < > = values in this interval not displayed.     Hematology Recent Labs  Lab 12/16/19 1041  WBC 7.3  RBC 4.29  HGB 14.8  HCT 46.2  MCV 107.7*  MCH 34.5*  MCHC 32.0  RDW 14.2  PLT 211    BNP Recent Labs  Lab 12/16/19 1041  BNP >4,500.0*     DDimer No results for input(s): DDIMER in the last 168 hours.   Radiology    CT ABDOMEN PELVIS W CONTRAST  Result Date: 12/17/2019 CLINICAL DATA:  Nausea, vomiting, abdominal pain. EXAM: CT ABDOMEN AND PELVIS WITH CONTRAST TECHNIQUE: Multidetector CT imaging of the abdomen and pelvis was performed using the standard protocol following bolus administration of intravenous contrast. CONTRAST:  OMNIPAQUE IOHEXOL 300 MG/ML  SOLN COMPARISON:  None. FINDINGS: Lower chest: Mild right pleural effusion is noted with adjacent subsegmental atelectasis. Small pericardial effusion is noted. Hepatobiliary: Hepatic steatosis is noted. No gallstones, gallbladder wall thickening, or biliary dilatation. Pancreas: Unremarkable. No pancreatic ductal dilatation or surrounding inflammatory changes. Spleen: Normal in size without focal abnormality. Adrenals/Urinary Tract: Adrenal glands are unremarkable. Kidneys are normal, without renal calculi, focal lesion, or hydronephrosis. Bladder is unremarkable. Stomach/Bowel: Stomach is within normal limits. Appendix appears normal. No evidence of bowel wall  thickening, distention, or inflammatory changes. Vascular/Lymphatic: 4 cm infrarenal abdominal aortic aneurysm is noted. Atherosclerosis of abdominal aorta is noted. No adenopathy is noted. Reproductive: Prostate is unremarkable. Other: No abdominal wall hernia or abnormality. No abdominopelvic ascites. Musculoskeletal: No acute or significant osseous findings. IMPRESSION: 1. Mild right pleural effusion is noted with adjacent subsegmental atelectasis. 2. Small pericardial effusion. 3. Hepatic steatosis. 4. 4 cm infrarenal abdominal aortic aneurysm is noted. Recommend followup by [place] in 1 year. This recommendation follows ACR consensus guidelines: White Paper of the ACR Incidental Findings Committee II on Vascular Findings. J Am Coll Radiol 2013; 10:789-794. Aortic Atherosclerosis (ICD10-I70.0). Electronically Signed   By: Lupita Raider M.D.   On: 12/17/2019 14:08   US AORTA  Result Date: 12/16/2019 CLINICAL DATA:  History of abdominal aortic aneurysm. EXAM: ULTRASOUND OF ABDOMINAL AORTA TECHNIQUE: Ultrasound examination of the abdominal aorta and proximal common iliac arteries was performed to evaluate for aneurysm. Additional color and Doppler images of the distal aorta were obtained to document patency. COMPARISON:  None. FINDINGS: Abdominal aortic measurements as follows: Proximal:  2.3 x 3.0 cm Mid:  3.8 x 4.9 cm Distal:  1.7 x 2.4 cm Patent: Yes, peak systolic velocity is 34 cm/s Right common iliac artery: 1 cm Left common iliac artery: 1 cm IMPRESSION: Aortic atherosclerosis with fusiform aortic aneurysm measuring 4.9 x 3.8 cm in maximum orthogonal diameters and extending about 5.6 cm craniocaudal distance. Electronically Signed   By: Kennith Center M.D.   On: 12/16/2019 18:50   ECHOCARDIOGRAM COMPLETE  Result Date: 12/17/2019    ECHOCARDIOGRAM REPORT   Patient Name:   Cole Fuller Date of Exam: 12/17/2019 Medical Rec #:  [id number]       Height:       72.0 in Accession #:    [id number]      Weight:        160.0 lb Date of Birth:  1951-08-22       BSA:          1.938 m Patient Age:    69 years  BP:           127/92 mmHg Patient Gender: M               HR:           102 bpm. Exam Location:  Inpatient Procedure: 2D Echo, 3D Echo, Color Doppler and Cardiac Doppler Indications:    I50.31 Acute diastolic (congestive) heart failure  History:        Patient has no prior history of Echocardiogram examinations.                 CHF, CAD, PAD; Risk Factors:Hypertension.  Sonographer:    Irving BurtonEmily Senior RDCS Referring Phys: [phone] RONDELL A SMITH IMPRESSIONS  1. Left ventricular ejection fraction, by estimation, is 15-20%. The left ventricle has severely decreased function. The left ventricle demonstrates global hypokinesis. The left ventricular internal cavity size was severely dilated. Indeterminate diastolic filling due to E-A fusion.  2. Right ventricular systolic function is low normal. The right ventricular size is mildly enlarged. There is moderately elevated pulmonary artery systolic pressure. The estimated right ventricular systolic pressure is 45.2 mmHg.  3. Left atrial size was severely dilated.  4. Right atrial size was mild to moderately dilated.  5. The mitral valve is normal in structure. Moderate mitral valve regurgitation.  6. The aortic valve is normal in structure. Aortic valve regurgitation is not visualized.  7. The inferior vena cava is dilated in size with >50% respiratory variability, suggesting right atrial pressure of 8 mmHg. FINDINGS  Left Ventricle: Left ventricular ejection fraction, by estimation, is 15-20%. The left ventricle has severely decreased function. The left ventricle demonstrates global hypokinesis. Mild dyskinesis of the left ventricular, entire apical segment. The left ventricular internal cavity size was severely dilated. There is no left ventricular hypertrophy. Indeterminate diastolic filling due to E-A fusion. Right Ventricle: The right ventricular size is mildly enlarged. No  increase in right ventricular wall thickness. Right ventricular systolic function is low normal. There is moderately elevated pulmonary artery systolic pressure. The tricuspid regurgitant  velocity is 3.05 m/s, and with an assumed right atrial pressure of 8 mmHg, the estimated right ventricular systolic pressure is 45.2 mmHg. Left Atrium: Left atrial size was severely dilated. Right Atrium: Right atrial size was mild to moderately dilated. Pericardium: A small pericardial effusion is present. The pericardial effusion is circumferential. Mitral Valve: The mitral valve is normal in structure. Moderate mitral valve regurgitation, with centrally-directed jet. Tricuspid Valve: The tricuspid valve is normal in structure. Tricuspid valve regurgitation is mild. Aortic Valve: The aortic valve is normal in structure. Aortic valve regurgitation is not visualized. Pulmonic Valve: The pulmonic valve was grossly normal. Pulmonic valve regurgitation is not visualized. Aorta: The aortic root and ascending aorta are structurally normal, with no evidence of dilitation. Venous: The inferior vena cava is dilated in size with greater than 50% respiratory variability, suggesting right atrial pressure of 8 mmHg. IAS/Shunts: No atrial level shunt detected by color flow Doppler.  LEFT VENTRICLE PLAX 2D LVIDd:         7.50 cm LVIDs:         6.90 cm LV PW:         0.90 cm LV IVS:        0.80 cm LVOT diam:     2.30 cm LV SV:         32 LV SV Index:   16 LVOT Area:     4.15 cm  LV Volumes (MOD) LV vol d,  MOD A2C: 333.0 ml LV vol d, MOD A4C: 337.0 ml LV vol s, MOD A2C: 315.0 ml LV vol s, MOD A4C: 254.0 ml LV SV MOD A2C:     18.0 ml LV SV MOD A4C:     337.0 ml LV SV MOD BP:      49.0 ml RIGHT VENTRICLE RV S prime:     10.20 cm/s TAPSE (M-mode): 1.3 cm LEFT ATRIUM              Index       RIGHT ATRIUM           Index LA diam:        3.90 cm  2.01 cm/m  RA Area:     27.20 cm LA Vol (A2C):   126.0 ml 65.02 ml/m RA Volume:   92.60 ml  47.79 ml/m  LA Vol (A4C):   94.9 ml  48.97 ml/m LA Biplane Vol: 111.0 ml 57.28 ml/m  AORTIC VALVE LVOT Vmax:   72.50 cm/s LVOT Vmean:  51.300 cm/s LVOT VTI:    0.076 m  AORTA Ao Root diam: 3.70 cm Ao Asc diam:  3.00 cm MITRAL VALVE                 TRICUSPID VALVE MV Area (PHT): 5.97 cm      TR Peak grad:   37.2 mmHg MV Decel Time: 127 msec      TR Vmax:        305.00 cm/s MR Peak grad:    72.6 mmHg MR Mean grad:    44.0 mmHg   SHUNTS MR Vmax:         426.00 cm/s Systemic VTI:  0.08 m MR Vmean:        313.0 cm/s  Systemic Diam: 2.30 cm MR PISA:         1.01 cm MR PISA Eff ROA: 9 mm MR PISA Radius:  0.40 cm MV E velocity: 109.00 cm/s Dani Gobble Croitoru MD Electronically signed by Sanda Klein MD Signature Date/Time: 12/17/2019/1:42:15 PM    Final    VAS [place] LOWER EXTREMITY VENOUS (DVT) (MC and WL 7a-7p)  Result Date: 12/16/2019  Lower Venous DVTStudy Indications: Swelling, and SOB.  Comparison Study: no prior Performing Technologist: June Leap RDMS, RVT  Examination Guidelines: A complete evaluation includes B-mode imaging, spectral Doppler, color Doppler, and power Doppler as needed of all accessible portions of each vessel. Bilateral testing is considered an integral part of a complete examination. Limited examinations for reoccurring indications may be performed as noted. The reflux portion of the exam is performed with the patient in reverse Trendelenburg.  +---------+---------------+---------+-----------+----------+--------------+ RIGHT    CompressibilityPhasicitySpontaneityPropertiesThrombus Aging +---------+---------------+---------+-----------+----------+--------------+ CFV      Full           Yes      Yes                                 +---------+---------------+---------+-----------+----------+--------------+ SFJ      Full                                                        +---------+---------------+---------+-----------+----------+--------------+ FV Prox  Full                                                         +---------+---------------+---------+-----------+----------+--------------+  FV Mid   Full                                                        +---------+---------------+---------+-----------+----------+--------------+ FV DistalFull                                                        +---------+---------------+---------+-----------+----------+--------------+ PFV      Full                                                        +---------+---------------+---------+-----------+----------+--------------+ POP      Full           Yes      Yes                                 +---------+---------------+---------+-----------+----------+--------------+ PTV      Full                                                        +---------+---------------+---------+-----------+----------+--------------+ PERO     Full                                                        +---------+---------------+---------+-----------+----------+--------------+   +---------+---------------+---------+-----------+----------+--------------+ LEFT     CompressibilityPhasicitySpontaneityPropertiesThrombus Aging +---------+---------------+---------+-----------+----------+--------------+ CFV      Full           Yes      Yes                                 +---------+---------------+---------+-----------+----------+--------------+ SFJ      Full                                                        +---------+---------------+---------+-----------+----------+--------------+ FV Prox  Full                                                        +---------+---------------+---------+-----------+----------+--------------+ FV Mid   Full                                                        +---------+---------------+---------+-----------+----------+--------------+  FV DistalFull                                                         +---------+---------------+---------+-----------+----------+--------------+ PFV      Full                                                        +---------+---------------+---------+-----------+----------+--------------+ POP      Full           Yes      Yes                                 +---------+---------------+---------+-----------+----------+--------------+ PTV      Full                                                        +---------+---------------+---------+-----------+----------+--------------+ PERO     Full                                                        +---------+---------------+---------+-----------+----------+--------------+     Summary: RIGHT: - There is no evidence of deep vein thrombosis in the lower extremity.  - No cystic structure found in the popliteal fossa. - Pulsatile venous flow suggests increased right sided heart pressure.  LEFT: - There is no evidence of deep vein thrombosis in the lower extremity.  - A cystic structure is found in the popliteal fossa. - Pulsatile venous flow suggests increased right sided heart pressure.  *See table(s) above for measurements and observations. Electronically signed by Gretta Began MD on 12/16/2019 at 3:13:21 PM.    Final     Cardiac Studies     Patient Profile       Assessment & Plan     1.  Acute systolic CHF exacerbation: -Patient presented with altered mental status (per chart review) with acute metabolic encephalopathy felt to be possibly secondary to oxycodone use.  On hospital presentation, patient found to be volume overloaded with a BNP of greater than 4500 and subsequent CXR with right-sided pleural effusion with possible infiltrates. His O2 saturations were suboptimal at 83% which improved with supplemental O2. -Echocardiogram performed 12/17/19 with LVEF at 15-20% with global hypokinesis of the LV and entire apical segment, moderate MR and mild TR. Will need ischemic workup, however patient is  quite keen to leave. Consider [facility] Monday. -He has a PMH of CAD with remote stent placement in 2004 and very poor follow up. He reports a 2 week hx of dyspnea on hospital presentation with mild LE edema.  -CT shows mild right pleural effusion with atelectasis and small pericardial effusion is noted  Cr: 0.81>0.84 UOP yesterday: 2.15 L Weight: 160 > 153 lb Net negative for admission: -2 L Diuretic plan: continue lasix 40  mg IV BID.   2.  Community-acquired pneumonia: -CXR concerning for right lower lobe infiltrate possibly secondary to aspiration p -Patient started on empiric antibiotics>>> initially on Rocephin however this was transitioned to doxycycline secondary to prolonged QT interval -Management per primary team  3.  Elevated troponin with known hx of CAD: -High-sensitivity troponin found to be 81 with subsequent level at 80, not necessarily consistent with ACS -Elevation likely in the setting of acute volume overload -Has past hx of CAD s/p PCI of the circumflex. He also has a known chronic and stable 95% distal small codominant RCA stenosis. -Stress test performed 09/2004 which showed mild inferior ischemia consistent with his known anatomy without evidence ofischemia in the circumflex or LAD distribution.  -Last LHC performed pre-operatively in 2006 showed no evidence of restenosis withinhis circumflex stents. RCA remained stable and was felt to possibly be responsible for inferior ischemic abnormality on stress test results. It was noted that if pain becomes more frequent or severe, he could possibly be a candidate for PCI of his RCA.  -Denies chest pain -Continue ASA, statin, beta-blocker  4.  History of peripheral arterial disease: -Followed remotely with Dr. Truitt Leep. Allyson Sabal -Has past hx of right superficial femoral artery stent placed by Dr. Allyson Sabal in 2004>>last seen by Dr. Darrick Penna for claudication symptoms at which time plan was to continue with risk factor modification  and smoking cessation and repeat ABIs in 6 months.  Unfortunately, he has not been seen since this time.  5.  History of AAA: -Ultrasound SFA stent placement showed a small abdominal aortic aneurysm measuring 3.7 cm in diameter. Plan was to follow closely with serial studies however he has not been seen by our service or vascular team since that time.  -He has complaints of abdominal pain on presentation with bruit per primary team's note -CT performed which shows AAA at 4cm in diameter. -Continue with close follow up and adequate BP control   6. Tobacco abuse: -Continues to smoke everyday  -Cessation strongly encouraged however patient has no intention of quitting   7. Chronic AAA ( previously measuring 3.7 cm): - Palpable on exam, non-tender. No abdominal or additional symptoms to suggest change. Duplex 12/2015 stable.   8. HTN: -Stable overall - losartan 12.5 mg daily started this admission - start BB when HF is better compensated, hopefully in next 1-2 days.       For questions or updates, please contact [facility] Please consult [email or web address] for contact info under        Signed, Parke Poisson, MD  12/18/2019, 11:33 AM

## 2019-12-18 NOTE — Progress Notes (Signed)
Patient stated that he currently does not have any current thoughts of self-injury (SI), but that he has been struggling with depression. Patient is requesting higher dose of Xanax than what has been ordered. States that he takes 1 mg every 2-3 hours at home. Bruna Potter, M.D paged with [facility].   Bari Edward, RN

## 2019-12-18 NOTE — Consult Note (Signed)
Telepsych Consultation   Reason for Consult:  ''depression, needs medication assistance.'' Referring Physician:  Kathlen Mody, MD Location of Patient: [address] Location of Provider: [facility]  Patient Identification: Cole Fuller MRN:  [id number] Principal Diagnosis: Acute exacerbation of CHF (congestive heart failure) (HCC) Diagnosis:  Principal Problem:   Acute exacerbation of CHF (congestive heart failure) (HCC) Active Problems:   PAD (peripheral artery disease) (HCC)   AAA (abdominal aortic aneurysm) (HCC)   Tobacco abuse   Chronic pain syndrome   Prolonged QT interval   Acute metabolic encephalopathy   Baker cyst   Adjustment disorder with depressed mood   Total Time spent with patient: 45 minutes  Subjective:   Cole Fuller is a 69 y.o. male patient admitted with altered mental status.  HPI:  69 y.o. male with medical history significant of CAD s/p stent in 2004, HTN, PAD s/p stent , AAA, anxiety, arthritis, tobacco abuse, and chronic pain who was admitted due to altered mental status. Patient is alert, oriented, cooperative and reports that his anxiety is under control but has been experiencing worsening depressive symptoms in the last 3 weeks. He is endorsing low energy level, difficulty sleeping, poor appetite, anhedonia but denies hopelessness, psychosis, delusions or self harming thoughts. He attributes his depression to his multiple chronic medical problem especially Chronic lower back pain. He agreed to a trial of anti-depressant.  Past Psychiatric History: Anxiety Risk to Self:  denies Risk to Others:  denies Prior Inpatient Therapy:  denies Prior Outpatient Therapy:  none reported  Past Medical History:  Past Medical History:  Diagnosis Date  . Anxiety   . Arthritis   . Arthritis pain   . Back pain   . Bruises easily   . Chills   . Coronary artery disease    with stenting in 2004, sees Dr Allyson Sabal  . Hypertension   . Inguinal hernia    right  . Inguinal hernia    Right  . Peripheral vascular disease [facility])     Past Surgical History:  Procedure Laterality Date  . CARDIAC CATHETERIZATION  2004   with stents x2  . FRACTURE SURGERY     left foot  . heart stents    . INGUINAL HERNIA REPAIR  01/10/2012   Procedure: HERNIA REPAIR INGUINAL ADULT;  Surgeon: Cherylynn Ridges, MD;  Location: [facility] OR;  Service: General;  Laterality: Right;  . KNEE ARTHROSCOPY     Right  . ORTHOPEDIC SURGERY    . PERIPHERAL VASCULAR CATHETERIZATION N/A 09/15/2015   Procedure: Abdominal Aortogram;  Surgeon: Sherren Kerns, MD;  Location: [facility] INVASIVE CV LAB;  Service: Cardiovascular;  Laterality: N/A;  . stents in leg     right   Family History: No family history on file. Family Psychiatric  History:  Social History:  Social History   Substance and Sexual Activity  Alcohol Use No  . Alcohol/week: 0.0 standard drinks     Social History   Substance and Sexual Activity  Drug Use No    Social History   Socioeconomic History  . Marital status: Married    Spouse name: Not on file  . Number of children: Not on file  . Years of education: Not on file  . Highest education level: Not on file  Occupational History  . Not on file  Tobacco Use  . Smoking status: Current Every Day Smoker    Packs/day: 0.50    Types: Cigarettes  Substance and Sexual Activity  .  Alcohol use: No    Alcohol/week: 0.0 standard drinks  . Drug use: No  . Sexual activity: Not on file  Other Topics Concern  . Not on file  Social History Narrative  . Not on file   Social Determinants of Health   Financial Resource Strain:   . Difficulty of Paying Living Expenses:   Food Insecurity:   . Worried About Charity fundraiser in the Last Year:   . Arboriculturist in the Last Year:   Transportation Needs:   . Film/video editor (Medical):   Marland Kitchen Lack of Transportation (Non-Medical):   Physical Activity:   . Days of Exercise per Week:   . Minutes of Exercise per  Session:   Stress:   . Feeling of Stress :   Social Connections:   . Frequency of Communication with Friends and Family:   . Frequency of Social Gatherings with Friends and Family:   . Attends Religious Services:   . Active Member of Clubs or Organizations:   . Attends Archivist Meetings:   Marland Kitchen Marital Status:    Additional Social History:    Allergies:   Allergies  Allergen Reactions  . Benicar Hct [Olmesartan Medoxomil-Hctz] Shortness Of Breath  . Butrans [Buprenorphine] Diarrhea and Nausea And Vomiting  . Nadolol Shortness Of Breath  . Morphine And Related Hives and Itching  . Prednisone Other (See Comments)    Chest and throat pain    Labs:  Results for orders placed or performed during the hospital encounter of 12/16/19 (from the past 48 hour(s))  HIV Antibody (routine testing w rflx)     Status: None   Collection Time: 12/16/19  5:54 PM  Result Value Ref Range   HIV Screen 4th Generation wRfx NON REACTIVE NON REACTIVE    Comment: Performed at [facility], [address], [place], [place] [address]  Acetaminophen level     Status: Abnormal   Collection Time: 12/16/19  5:54 PM  Result Value Ref Range   Acetaminophen (Tylenol), Serum <10 (L) 10 - 30 ug/mL    Comment: (NOTE) Therapeutic concentrations vary significantly. A range of 10-30 ug/mL  may be an effective concentration for many patients. However, some  are best treated at concentrations outside of this range. Acetaminophen concentrations >150 ug/mL at 4 hours after ingestion  and >50 ug/mL at 12 hours after ingestion are often associated with  toxic reactions. Performed at [facility], [place] [address] [place], [place] [address]   Salicylate level     Status: Abnormal   Collection Time: 12/16/19  5:54 PM  Result Value Ref Range   Salicylate Lvl <8.9 (L) 7.0 - 30.0 mg/dL    Comment: Performed at [facility] [address] [place], [place] [address]  TSH     Status: None    Collection Time: 12/16/19  6:00 PM  Result Value Ref Range   TSH 1.311 0.350 - 4.500 uIU/mL    Comment: Performed by a 3rd Generation assay with a functional sensitivity of <=0.01 uIU/mL. Performed at [facility], [place] [address] [place], [place] [address]   Basic metabolic panel     Status: Abnormal   Collection Time: 12/17/19  4:00 AM  Result Value Ref Range   Sodium 141 135 - 145 mmol/L   Potassium 5.2 (H) 3.5 - 5.1 mmol/L    Comment: SPECIMEN HEMOLYZED. HEMOLYSIS MAY AFFECT INTEGRITY OF RESULTS.   Chloride 101 98 -  111 mmol/L   CO2 27 22 - 32 mmol/L   Glucose, Bld 97 70 - 99 mg/dL    Comment: Glucose reference range applies only to samples taken after fasting for at least 8 hours.   BUN 12 8 - 23 mg/dL   Creatinine, Ser 5.36 0.61 - 1.24 mg/dL   Calcium 8.9 8.9 - 64.4 mg/dL   GFR calc non Af Amer >60 >60 mL/min   GFR calc Af Amer >60 >60 mL/min   Anion gap 13 5 - 15    Comment: Performed at [facility], [address] [place], [place] [address]  Potassium     Status: None   Collection Time: 12/17/19 11:03 AM  Result Value Ref Range   Potassium 4.6 3.5 - 5.1 mmol/L    Comment: Performed at [facility], [address] [place], [place] [address]  Basic metabolic panel     Status: Abnormal   Collection Time: 12/18/19  3:31 AM  Result Value Ref Range   Sodium 139 135 - 145 mmol/L   Potassium 3.5 3.5 - 5.1 mmol/L   Chloride 95 (L) 98 - 111 mmol/L   CO2 30 22 - 32 mmol/L   Glucose, Bld 97 70 - 99 mg/dL    Comment: Glucose reference range applies only to samples taken after fasting for at least 8 hours.   BUN 12 8 - 23 mg/dL   Creatinine, Ser 3.87 0.61 - 1.24 mg/dL   Calcium 9.2 8.9 - 56.4 mg/dL   GFR calc non Af Amer >60 >60 mL/min   GFR calc Af Amer >60 >60 mL/min   Anion gap 14 5 - 15    Comment: Performed at [facility], [address], [place], [place] [address]    Medications:  Current Facility-Administered Medications  Medication Dose  Route Frequency Provider Last Rate Last Admin  . 0.9 %  sodium chloride infusion  250 mL Intravenous PRN Madelyn Flavors A, MD      . acetaminophen (TYLENOL) tablet 650 mg  650 mg Oral Q4H PRN Madelyn Flavors A, MD      . ALPRAZolam Prudy Feeler) tablet 1 mg  1 mg Oral TID PRN Kathlen Mody, MD   1 mg at 12/18/19 1251  . aspirin chewable tablet 81 mg  81 mg Oral Daily Madelyn Flavors A, MD   81 mg at 12/18/19 0904  . cefTRIAXone (ROCEPHIN) 2 g in sodium chloride 0.9 % 100 mL IVPB  2 g Intravenous Q24H Smith, Rondell A, MD 200 mL/hr at 12/18/19 1241 2 g at 12/18/19 1241  . doxycycline (VIBRA-TABS) tablet 100 mg  100 mg Oral Q12H Smith, Rondell A, MD   100 mg at 12/18/19 0904  . enoxaparin (LOVENOX) injection 40 mg  40 mg Subcutaneous Q24H Smith, Rondell A, MD   40 mg at 12/17/19 1551  . furosemide (LASIX) injection 40 mg  40 mg Intravenous BID Madelyn Flavors A, MD   40 mg at 12/18/19 0905  . losartan (COZAAR) tablet 12.5 mg  12.5 mg Oral Daily Georgie Chard D, NP   12.5 mg at 12/18/19 0905  . nicotine (NICODERM CQ - dosed in mg/24 hours) patch 21 mg  21 mg Transdermal Daily Madelyn Flavors A, MD   21 mg at 12/18/19 0906  . oxyCODONE-acetaminophen (PERCOCET/ROXICET) 5-325 MG per tablet 1-2 tablet  1-2 tablet Oral Q4H PRN Kathlen Mody, MD      . pantoprazole (PROTONIX) EC tablet 40 mg  40 mg  Oral [id number] Kathlen Mody, MD   40 mg at 12/18/19 0904  . sodium chloride flush (NS) 0.9 % injection 3 mL  3 mL Intravenous Q12H Smith, Rondell A, MD   3 mL at 12/18/19 0906  . sodium chloride flush (NS) 0.9 % injection 3 mL  3 mL Intravenous PRN Smith, Rondell A, MD      . sucralfate (CARAFATE) 1 GM/10ML suspension 1 g  1 g Oral TID WC & HS Kathlen Mody, MD   1 g at 12/18/19 1238  . traZODone (DESYREL) tablet 100 mg  100 mg Oral QHS Thedore Mins, MD        Musculoskeletal: Strength & Muscle Tone: not tested, patient assessed via tele health Gait & Station: not tested, patient assessed via tele health Patient  leans: N/A  Psychiatric Specialty Exam: Physical Exam  Psychiatric: His speech is normal. Judgment and thought content normal. He is slowed and withdrawn. Cognition and memory are normal. He exhibits a depressed mood.    Review of Systems  Constitutional: Positive for fatigue.  HENT: Negative.   Eyes: Negative.   Endocrine: Negative.   Musculoskeletal: Positive for back pain.  Allergic/Immunologic: Negative.   Neurological: Negative.   Hematological: Negative.   Psychiatric/Behavioral: Positive for dysphoric mood.    Blood pressure 129/81, pulse (!) 101, temperature 97.6 F (36.4 C), temperature source Oral, resp. rate 19, height 6' (1.829 m), weight 69.8 kg, SpO2 98 %.Body mass index is 20.87 kg/m.  General Appearance: Casual  Eye Contact:  Good  Speech:  Clear and Coherent  Volume:  Decreased  Mood:  Dysphoric  Affect:  Constricted  Thought Process:  Coherent  Orientation:  Full (Time, Place, and Person)  Thought Content:  Logical  Suicidal Thoughts:  No  Homicidal Thoughts:  No  Memory:  Immediate;   Good Recent;   Good Remote;   Fair  Judgement:  Intact  Insight:  Fair  Psychomotor Activity:  Psychomotor Retardation  Concentration:  Concentration: Fair and Attention Span: Fair  Recall:  [employer] of Knowledge:  Fair  Language:  Good  Akathisia:  No  Handed:  Right  AIMS (if indicated):     Assets:  Communication Skills Desire for Improvement Social Support  ADL's:  Intact  Cognition:  WNL  Sleep:   poor     Treatment Plan Summary: Recommendations: -Consider Trazodone 100 mg daily at bedtime for depression -Consider referring patient to a therapist for counseling upon discharge. -Psychiatric service signing out. Re-consult as needed.  Disposition: No evidence of imminent risk to self or others at present.   Patient does not meet criteria for psychiatric inpatient admission. Supportive therapy provided about ongoing stressors.  This service was  provided via telemedicine using a 2-way, interactive audio and video technology.  Names of all persons participating in this telemedicine service and their role in this encounter. Name: Cole Fuller Role: patient  Name: Hinton Dyer Role: RN  Name: Thedore Mins, MD Role: psychiatrist    Thedore Mins, MD 12/18/2019 3:09 PM

## 2019-12-18 NOTE — Progress Notes (Signed)
PROGRESS NOTE    Cole Fuller  OHY:[id number] DOB: 01-24-1951 DOA: 12/16/2019 PCP: Osa Craver, MD    Brief Narrative:   69-year-old gentleman prior history of coronary artery disease s/p PCI in 2004, peripheral artery disease s/p stent, AAA, hypertension, ongoing tobacco abuse, chronic pain syndrome was brought into ED for altered mental status on 12/16/2019 his chest x-ray showed right basilar infiltrate with associated effusion concerning for right-sided pneumonia patient was started on empiric antibiotics and referred to [facility] for admission.  Assessment & Plan:   Principal Problem:   Acute exacerbation of CHF (congestive heart failure) (HCC) Active Problems:   PAD (peripheral artery disease) (HCC)   AAA (abdominal aortic aneurysm) (HCC)   Tobacco abuse   Chronic pain syndrome   Prolonged QT interval   Acute metabolic encephalopathy   Baker cyst   Adjustment disorder with depressed mood   Community-acquired pneumonia/right-sided pneumonia Continue with IV Rocephin and doxycycline.  Blood cultures have been negative so far and urine streptococcal and Legionella antigen have been negative. Nasal cannula oxygen to keep sats greater than 90%.     Acute systolic heart failure associated evident with right-sided pleural effusion bilateral lower extremity edema Echocardiogram reveals Left ventricular ejection fraction, by estimation, is 15-20%. The left ventricle has severely decreased function. The left ventricle demonstrates global hypokinesis. The left ventricular internal cavity size was severely dilated. Indeterminate diastolic filling due to E-A fusion. 2. Right ventricular systolic function is low normal. The right ventricular size is mildly enlarged. There is moderately elevated pulmonary artery systolic pressure. The estimated right ventricular systolic pressure is 94.8 mmHg. Continue with IV Lasix 40 mg twice daily, follow strict intake and output and daily weights,  patient diuresed about 2 L since admission.  Cardiology consulted and plan for right/left heart cath on Monday.     Prolonged QT interval Improving Keep potassium greater than 4 and magnesium greater than 2.     Chronic pain syndrome Resume home medications at this time.    Peripheral artery disease Resume aspirin   Acute encephalopathy probably secondary to medications. Resolved  Patient reports being depressed and has chronic pain also has anxiety.  Unfortunately patient has been let go from the pain clinic recently. Psychiatric consulted for medication and Xanax has been increased to 1 mg 3 times daily as needed.   Nausea, dry heaving and lower abdominal discomfort going on for many weeks to months CT of the abdomen pelvis does not show any acute intra-abdominal pathology that would explain the symptoms. Symptomatic management Patient also reports taking multiple pain medication.  Sucralfate and PPI ordered for possible gastritis. Patient currently denies any rectal bleed or melanotic stools.  DVT prophylaxis: Lovenox  code Status: Full code  family Communication: Family at bedside Disposition Plan:  . Patient came from: Home            . Anticipated d/c place: Probably home with home PT . Barriers to d/c OR conditions which need to be met to effect a safe d/c: Ongoing treatment for pneumonia and plan for right and left heart cath on Monday   Consultants:   None  Procedures: Echocardiogram Antimicrobials: (Rocephin and doxycycline since admission  Subjective: Persistent nausea today but denies any chest pain or shortness of breath at this time  Objective: Vitals:   12/17/19 1954 12/18/19 0325 12/18/19 0358 12/18/19 1500  BP: 133/87  129/81 109/81  Pulse: 93  (!) 101 67  Resp: (!) 21  19   Temp:  98 F (36.7 C)  97.6 F (36.4 C) 97.6 F (36.4 C)  TempSrc: Oral  Oral Oral  SpO2: 99%  98% 96%  Weight:  69.8 kg    Height:        Intake/Output Summary  (Last 24 hours) at 12/18/2019 1740 Last data filed at 12/18/2019 1515 Gross per 24 hour  Intake 1366 ml  Output 2605 ml  Net -1239 ml   Filed Weights   12/16/19 2234 12/18/19 0325  Weight: 72.6 kg 69.8 kg    Examination:  General exam: Alert and comfortable, not in any kind of distress. Respiratory system: Clear to auscultation bilaterally, no wheezing or rhonchi Cardiovascular system: S1-S2 heard, regular rate rhythm, no JVD Gastrointestinal system: abdomen is soft non tender non distended bowel sounds wnl.  Central nervous system: Alert and non focal.  Extremities: trace edema.  Skin: No rashes seen.  Psychiatry: mood is appropriate.     Data Reviewed: I have personally reviewed following labs and imaging studies  CBC: Recent Labs  Lab 12/16/19 1041  WBC 7.3  NEUTROABS 5.1  HGB 14.8  HCT 46.2  MCV 107.7*  PLT 315   Basic Metabolic Panel: Recent Labs  Lab 12/16/19 1041 12/17/19 0400 12/17/19 1103 12/18/19 0331  NA 139 141  --  139  K 4.3 5.2* 4.6 3.5  CL 102 101  --  95*  CO2 28 27  --  30  GLUCOSE 122* 97  --  97  BUN 11 12  --  12  CREATININE 0.91 0.81  --  0.84  CALCIUM 9.3 8.9  --  9.2   GFR: Estimated Creatinine Clearance: 81.9 mL/min (by C-G formula based on SCr of 0.84 mg/dL). Liver Function Tests: Recent Labs  Lab 12/16/19 1041  AST 33  ALT 49*  ALKPHOS 52  BILITOT 1.2  PROT 6.5  ALBUMIN 3.6   Recent Labs  Lab 12/16/19 1041  LIPASE 16   Recent Labs  Lab 12/16/19 1041  AMMONIA 31   Coagulation Profile: No results for input(s): INR, PROTIME in the last 168 hours. Cardiac Enzymes: No results for input(s): CKTOTAL, CKMB, CKMBINDEX, TROPONINI in the last 168 hours. BNP (last 3 results) No results for input(s): PROBNP in the last 8760 hours. HbA1C: No results for input(s): HGBA1C in the last 72 hours. CBG: Recent Labs  Lab 12/16/19 1041  GLUCAP 112*   Lipid Profile: No results for input(s): CHOL, HDL, LDLCALC, TRIG, CHOLHDL,  LDLDIRECT in the last 72 hours. Thyroid Function Tests: Recent Labs    12/16/19 1800  TSH 1.311   Anemia Panel: No results for input(s): VITAMINB12, FOLATE, FERRITIN, TIBC, IRON, RETICCTPCT in the last 72 hours. Sepsis Labs: Recent Labs  Lab 12/16/19 1058 12/16/19 1401  LATICACIDVEN 1.3 1.4    Recent Results (from the past 240 hour(s))  SARS CORONAVIRUS 2 (TAT 6-24 HRS) Nasopharyngeal Nasopharyngeal Swab     Status: None   Collection Time: 12/16/19 12:07 PM   Specimen: Nasopharyngeal Swab  Result Value Ref Range Status   SARS Coronavirus 2 NEGATIVE NEGATIVE Final    Comment: (NOTE) SARS-CoV-2 target nucleic acids are NOT DETECTED. The SARS-CoV-2 RNA is generally detectable in upper and lower respiratory specimens during the acute phase of infection. Negative results do not preclude SARS-CoV-2 infection, do not rule out co-infections with other pathogens, and should not be used as the sole basis for treatment or other patient management decisions. Negative results must be combined with clinical observations, patient history, and epidemiological information.  The expected result is Negative. Fact Sheet for Patients: SugarRoll.be Fact Sheet for Healthcare Providers: https://www.woods-mathews.com/ This test is not yet approved or cleared by the [place] FDA and  has been authorized for detection and/or diagnosis of SARS-CoV-2 by FDA under an Emergency Use Authorization (EUA). This EUA will remain  in effect (meaning this test can be used) for the duration of the COVID-19 declaration under Section 56 4(b)(1) of the Act, 21 U.S.C. section 360bbb-3(b)(1), unless the authorization is terminated or revoked sooner. Performed at [facility], [place] [address], [place], [place] [address]          Radiology Studies: CT ABDOMEN PELVIS W CONTRAST  Result Date: 12/17/2019 CLINICAL DATA:  Nausea, vomiting, abdominal pain. EXAM: CT  ABDOMEN AND PELVIS WITH CONTRAST TECHNIQUE: Multidetector CT imaging of the abdomen and pelvis was performed using the standard protocol following bolus administration of intravenous contrast. CONTRAST:  135m OMNIPAQUE IOHEXOL 300 MG/ML  SOLN COMPARISON:  None. FINDINGS: Lower chest: Mild right pleural effusion is noted with adjacent subsegmental atelectasis. Small pericardial effusion is noted. Hepatobiliary: Hepatic steatosis is noted. No gallstones, gallbladder wall thickening, or biliary dilatation. Pancreas: Unremarkable. No pancreatic ductal dilatation or surrounding inflammatory changes. Spleen: Normal in size without focal abnormality. Adrenals/Urinary Tract: Adrenal glands are unremarkable. Kidneys are normal, without renal calculi, focal lesion, or hydronephrosis. Bladder is unremarkable. Stomach/Bowel: Stomach is within normal limits. Appendix appears normal. No evidence of bowel wall thickening, distention, or inflammatory changes. Vascular/Lymphatic: 4 cm infrarenal abdominal aortic aneurysm is noted. Atherosclerosis of abdominal aorta is noted. No adenopathy is noted. Reproductive: Prostate is unremarkable. Other: No abdominal wall hernia or abnormality. No abdominopelvic ascites. Musculoskeletal: No acute or significant osseous findings. IMPRESSION: 1. Mild right pleural effusion is noted with adjacent subsegmental atelectasis. 2. Small pericardial effusion. 3. Hepatic steatosis. 4. 4 cm infrarenal abdominal aortic aneurysm is noted. Recommend followup by [place] 1 year. This recommendation follows ACR consensus guidelines: White Paper of the ACR Incidental Findings Committee II on Vascular Findings. J Am Coll Radiol 2013; 10:789-794. Aortic Atherosclerosis (ICD10-I70.0). Electronically Signed   By: JMarijo ConceptionM.D.   On: 12/17/2019 14:08   [place]  Result Date: 12/16/2019 CLINICAL DATA:  History of abdominal aortic aneurysm. EXAM: ULTRASOUND OF ABDOMINAL AORTA TECHNIQUE: Ultrasound examination  of the abdominal aorta and proximal common iliac arteries was performed to evaluate for aneurysm. Additional color and Doppler images of the distal aorta were obtained to document patency. COMPARISON:  None. FINDINGS: Abdominal aortic measurements as follows: Proximal:  2.3 x 3.0 cm Mid:  3.8 x 4.9 cm Distal:  1.7 x 2.4 cm Patent: Yes, peak systolic velocity is 34 cm/s Right common iliac artery: 1 cm Left common iliac artery: 1 cm IMPRESSION: Aortic atherosclerosis with fusiform aortic aneurysm measuring 4.9 x 3.8 cm in maximum orthogonal diameters and extending about 5.6 cm craniocaudal distance. Electronically Signed   By: EMisty StanleyM.D.   On: 12/16/2019 18:50   ECHOCARDIOGRAM COMPLETE  Result Date: 12/17/2019    ECHOCARDIOGRAM REPORT   Patient Name:   Cole PUERTASDate of Exam: 12/17/2019 Medical Rec #:  [id number]      Height:       72.0 in Accession #:    [id number]     Weight:       160.0 lb Date of Birth:  [birth year]      BSA:          1.938 m Patient Age:  69 years        BP:           127/92 mmHg Patient Gender: M               HR:           102 bpm. Exam Location:  Inpatient Procedure: 2D Echo, 3D Echo, Color Doppler and Cardiac Doppler Indications:    P53.74 Acute diastolic (congestive) heart failure  History:        Patient has no prior history of Echocardiogram examinations.                 CHF, CAD, PAD; Risk Factors:Hypertension.  Sonographer:    Raquel Sarna Senior RDCS Referring Phys: [phone] [place]  1. Left ventricular ejection fraction, by estimation, is 15-20%. The left ventricle has severely decreased function. The left ventricle demonstrates global hypokinesis. The left ventricular internal cavity size was severely dilated. Indeterminate diastolic filling due to E-A fusion.  2. Right ventricular systolic function is low normal. The right ventricular size is mildly enlarged. There is moderately elevated pulmonary artery systolic pressure. The estimated right ventricular  systolic pressure is 75.4 mmHg.  3. Left atrial size was severely dilated.  4. Right atrial size was mild to moderately dilated.  5. The mitral valve is normal in structure. Moderate mitral valve regurgitation.  6. The aortic valve is normal in structure. Aortic valve regurgitation is not visualized.  7. The inferior vena cava is dilated in size with >50% respiratory variability, suggesting right atrial pressure of 8 mmHg. FINDINGS  Left Ventricle: Left ventricular ejection fraction, by estimation, is 15-20%. The left ventricle has severely decreased function. The left ventricle demonstrates global hypokinesis. Mild dyskinesis of the left ventricular, entire apical segment. The left ventricular internal cavity size was severely dilated. There is no left ventricular hypertrophy. Indeterminate diastolic filling due to E-A fusion. Right Ventricle: The right ventricular size is mildly enlarged. No increase in right ventricular wall thickness. Right ventricular systolic function is low normal. There is moderately elevated pulmonary artery systolic pressure. The tricuspid regurgitant  velocity is 3.05 m/s, and with an assumed right atrial pressure of 8 mmHg, the estimated right ventricular systolic pressure is 49.2 mmHg. Left Atrium: Left atrial size was severely dilated. Right Atrium: Right atrial size was mild to moderately dilated. Pericardium: A small pericardial effusion is present. The pericardial effusion is circumferential. Mitral Valve: The mitral valve is normal in structure. Moderate mitral valve regurgitation, with centrally-directed jet. Tricuspid Valve: The tricuspid valve is normal in structure. Tricuspid valve regurgitation is mild. Aortic Valve: The aortic valve is normal in structure. Aortic valve regurgitation is not visualized. Pulmonic Valve: The pulmonic valve was grossly normal. Pulmonic valve regurgitation is not visualized. Aorta: The aortic root and ascending aorta are structurally normal, with  no evidence of dilitation. Venous: The inferior vena cava is dilated in size with greater than 50% respiratory variability, suggesting right atrial pressure of 8 mmHg. IAS/Shunts: No atrial level shunt detected by color flow Doppler.  LEFT VENTRICLE PLAX 2D LVIDd:         7.50 cm LVIDs:         6.90 cm LV PW:         0.90 cm LV IVS:        0.80 cm LVOT diam:     2.30 cm LV SV:         32 LV SV Index:   16 LVOT Area:  4.15 cm  LV Volumes (MOD) LV vol d, MOD A2C: 333.0 ml LV vol d, MOD A4C: 337.0 ml LV vol s, MOD A2C: 315.0 ml LV vol s, MOD A4C: 254.0 ml LV SV MOD A2C:     18.0 ml LV SV MOD A4C:     337.0 ml LV SV MOD BP:      49.0 ml RIGHT VENTRICLE RV S prime:     10.20 cm/s TAPSE (M-mode): 1.3 cm LEFT ATRIUM              Index       RIGHT ATRIUM           Index LA diam:        3.90 cm  2.01 cm/m  RA Area:     27.20 cm LA Vol (A2C):   126.0 ml 65.02 ml/m RA Volume:   92.60 ml  47.79 ml/m LA Vol (A4C):   94.9 ml  48.97 ml/m LA Biplane Vol: 111.0 ml 57.28 ml/m  AORTIC VALVE LVOT Vmax:   72.50 cm/s LVOT Vmean:  51.300 cm/s LVOT VTI:    0.076 m  AORTA Ao Root diam: 3.70 cm Ao Asc diam:  3.00 cm MITRAL VALVE                 TRICUSPID VALVE MV Area (PHT): 5.97 cm      TR Peak grad:   37.2 mmHg MV Decel Time: 127 msec      TR Vmax:        305.00 cm/s MR Peak grad:    72.6 mmHg MR Mean grad:    44.0 mmHg   SHUNTS MR Vmax:         426.00 cm/s Systemic VTI:  0.08 m MR Vmean:        313.0 cm/s  Systemic Diam: 2.30 cm MR PISA:         1.01 cm MR PISA Eff ROA: 9 mm MR PISA Radius:  0.40 cm MV E velocity: 109.00 cm/s Dani Gobble Croitoru MD Electronically signed by Sanda Klein MD Signature Date/Time: 12/17/2019/1:42:15 PM    Final         Scheduled Meds: . aspirin  81 mg Oral Daily  . doxycycline  100 mg Oral Q12H  . enoxaparin (LOVENOX) injection  40 mg Subcutaneous Q24H  . furosemide  40 mg Intravenous BID  . losartan  12.5 mg Oral Daily  . nicotine  21 mg Transdermal Daily  . pantoprazole  40 mg Oral  Q0600  . sodium chloride flush  3 mL Intravenous Q12H  . sucralfate  1 g Oral TID WC & HS  . traZODone  100 mg Oral QHS   Continuous Infusions: . sodium chloride    . cefTRIAXone (ROCEPHIN)  IV 2 g (12/18/19 1241)     LOS: 2 days        Hosie Poisson, MD [facility]   To contact the attending provider between 7A-7P or the covering provider during after hours 7P-7A, please log into the web site [email or web address] and access using universal [place] password for that web site. If you do not have the password, please call the hospital operator.  12/18/2019, 5:40 PM

## 2019-12-19 ENCOUNTER — Inpatient Hospital Stay (HOSPITAL_COMMUNITY): Payer: Medicare Other

## 2019-12-19 LAB — BASIC METABOLIC PANEL
Anion gap: 13 (ref 5–15)
BUN: 12 mg/dL (ref 8–23)
CO2: 32 mmol/L (ref 22–32)
Calcium: 9.2 mg/dL (ref 8.9–10.3)
Chloride: 95 mmol/L — ABNORMAL LOW (ref 98–111)
Creatinine, Ser: 0.82 mg/dL (ref 0.61–1.24)
GFR calc Af Amer: >60 mL/min (ref >60)
GFR calc non Af Amer: >60 mL/min (ref >60)
Glucose, Bld: 90 mg/dL (ref 70–99)
Potassium: 3.1 mmol/L — ABNORMAL LOW (ref 3.5–5.1)
Sodium: 140 mmol/L (ref 135–145)

## 2019-12-19 LAB — MAGNESIUM: Magnesium: 1.7 mg/dL (ref 1.7–2.4)

## 2019-12-19 LAB — CBC
HCT: 46.3 % (ref 39.0–52.0)
Hemoglobin: 15.1 g/dL (ref 13.0–17.0)
MCH: 33.8 pg (ref 26.0–34.0)
MCHC: 32.6 g/dL (ref 30.0–36.0)
MCV: 103.6 fL — ABNORMAL HIGH (ref 80.0–100.0)
Platelets: 173 10*3/uL (ref 150–400)
RBC: 4.47 MIL/uL (ref 4.22–5.81)
RDW: 13.9 % (ref 11.5–15.5)
WBC: 7 10*3/uL (ref 4.0–10.5)
nRBC: 0 % (ref 0.0–0.2)

## 2019-12-19 MED ORDER — FUROSEMIDE 10 MG/ML IJ SOLN
40.0000 mg | Freq: Once | INTRAMUSCULAR | Status: AC
  Filled 2019-12-19: qty 4

## 2019-12-19 MED ORDER — ONDANSETRON HCL 4 MG/2ML IJ SOLN
4.0000 mg | Freq: Once | INTRAMUSCULAR | Status: AC
  Administered 2019-12-19: 4 mg via INTRAVENOUS
  Filled 2019-12-19: qty 2

## 2019-12-19 MED ORDER — SODIUM CHLORIDE 0.9% FLUSH: 3.0000 mL | INTRAVENOUS | Status: DC | PRN

## 2019-12-19 MED ORDER — SODIUM CHLORIDE 0.9% FLUSH
3.0000 mL | Freq: Two times a day (BID) | INTRAVENOUS | Status: DC
  Administered 2019-12-19 – 2019-12-21 (×5): 3 mL via INTRAVENOUS

## 2019-12-19 MED ORDER — POTASSIUM CHLORIDE CRYS ER 20 MEQ PO TBCR: 80.0000 meq | EXTENDED_RELEASE_TABLET | Freq: Once | ORAL | Status: DC

## 2019-12-19 MED ORDER — SODIUM CHLORIDE 0.9 % IV SOLN: INTRAVENOUS | Status: DC

## 2019-12-19 MED ORDER — POTASSIUM CHLORIDE CRYS ER 20 MEQ PO TBCR
40.0000 meq | EXTENDED_RELEASE_TABLET | Freq: Two times a day (BID) | ORAL | Status: DC
  Administered 2019-12-19 (×2): 40 meq via ORAL
  Filled 2019-12-19 (×2): qty 2

## 2019-12-19 MED ORDER — ASPIRIN 81 MG PO CHEW
81.0000 mg | CHEWABLE_TABLET | ORAL | Status: AC
  Administered 2019-12-20: 81 mg via ORAL
  Filled 2019-12-19: qty 1

## 2019-12-19 MED ORDER — MAGNESIUM SULFATE 2 GM/50ML IV SOLN
2.0000 g | Freq: Once | INTRAVENOUS | Status: AC
  Administered 2019-12-19: 10:00:00 2 g via INTRAVENOUS
  Filled 2019-12-19: qty 50

## 2019-12-19 MED ORDER — CARVEDILOL 3.125 MG PO TABS
3.1250 mg | ORAL_TABLET | Freq: Two times a day (BID) | ORAL | Status: DC
  Administered 2019-12-19 – 2019-12-21 (×4): 3.125 mg via ORAL
  Filled 2019-12-19 (×5): qty 1

## 2019-12-19 MED ORDER — SODIUM CHLORIDE 0.9 % IV SOLN: 250.0000 mL | INTRAVENOUS | Status: DC | PRN

## 2019-12-19 NOTE — Progress Notes (Signed)
PROGRESS NOTE    Cole Fuller  AOZ:[id number] DOB: [birth year] DOA: 12/16/2019 PCP: Osa Craver, MD    Brief Narrative:   69-year-old gentleman prior history of coronary artery disease s/p PCI in 2004, peripheral artery disease s/p stent, AAA, hypertension, ongoing tobacco abuse, chronic pain syndrome was brought into ED for altered mental status on 12/16/2019 his chest x-ray showed right basilar infiltrate with associated effusion concerning for right-sided pneumonia patient was started on empiric antibiotics and referred to [facility] for admission.  Assessment & Plan:   Principal Problem:   Acute exacerbation of CHF (congestive heart failure) (HCC) Active Problems:   PAD (peripheral artery disease) (HCC)   AAA (abdominal aortic aneurysm) (HCC)   Tobacco abuse   Chronic pain syndrome   Prolonged QT interval   Acute metabolic encephalopathy   Baker cyst   Adjustment disorder with depressed mood   Community-acquired pneumonia/right-sided pneumonia Continue with IV Rocephin and doxycycline. Follow up CXR ordered   Blood cultures have been negative so far and urine streptococcal and Legionella antigen have been negative. Nasal cannula oxygen to keep sats greater than 90%. Pt reports breathing has improved, no chest pain.      Acute systolic heart failure associated evident with right-sided pleural effusion bilateral lower extremity edema: Improving Echocardiogram reveals Left ventricular ejection fraction, by estimation, is 15-20%. The left ventricle has severely decreased function. The left ventricle demonstrates global hypokinesis. The left ventricular internal cavity size was severely dilated. Indeterminate diastolic filling due to E-A fusion. 2. Right ventricular systolic function is low normal. The right ventricular size is mildly enlarged. There is moderately elevated pulmonary artery systolic pressure. The estimated right ventricular systolic pressure is 96.2 mmHg.  follow  strict intake and output and daily weights, patient diuresed about 2.1 L since admission.  Cardiology consulted and plan for right/left heart cath on Monday. Patient denies any chest pain at this time Cardiology decreased Lasix to 40 mg daily    Prolonged QT interval Worse when compared to admission. Replaced potassium and magnesium Keep potassium greater than 4 and magnesium greater than 2.     Chronic pain syndrome Resume home medications at this time.    Peripheral artery disease Resume aspirin   Acute encephalopathy probably secondary to medications. Resolved at this time. Patient reports being depressed, chronic pain and anxiety. unfortunately patient has been let go from the pain clinic recently. Psychiatric consulted for medication and Xanax has been increased to 1 mg 3 times daily as needed. Appreciate recommendation added trazodone at bedtime.   Nausea, dry heaving and lower abdominal discomfort going on for many weeks to months CT of the abdomen pelvis does not show any acute intra-abdominal pathology that would explain the symptoms. Symptomatic management with antiemetics, PPI and sucralfate Patient also reports taking multiple pain medication.  Sucralfate and PPI ordered for possible gastritis. Patient currently denies any rectal bleed or melanotic stools.  CBC within normal limits  DVT prophylaxis: Lovenox  code Status: Full code  family Communication: Family at bedside Disposition Plan:  . Patient came from: Home            . Anticipated d/c place: Probably home with home PT . Barriers to d/c OR conditions which need to be met to effect a safe d/c: Ongoing treatment for pneumonia and plan for right and left heart cath on Monday   Consultants:   None  Procedures: Echocardiogram Antimicrobials: (Rocephin and doxycycline since admission  Subjective: Slightly nauseated earlier this morning but  no diarrhea or chest pain or shortness of  breath  Objective: Vitals:   12/18/19 1939 12/19/19 0238 12/19/19 0937 12/19/19 1326  BP: 109/64 115/76 95/62 (!) 86/60  Pulse: 82 83 86 88  Resp: 17 16  17   Temp: 98.1 F (36.7 C) 97.7 F (36.5 C)  98.4 F (36.9 C)  TempSrc: Oral Oral  Oral  SpO2: 94% 92%  93%  Weight:  66.8 kg    Height:        Intake/Output Summary (Last 24 hours) at 12/19/2019 1546 Last data filed at 12/19/2019 1414 Gross per 24 hour  Intake 1336 ml  Output 1100 ml  Net 236 ml   Filed Weights   12/16/19 2234 12/18/19 0325 12/19/19 0238  Weight: 72.6 kg 69.8 kg 66.8 kg    Examination:  General exam: Alert and comfortable not in any kind of distress Respiratory system: Diminished air entry at bases, no wheezing or rhonchi Cardiovascular system: S1-S2 heard, regular rate rhythm, no JVD Gastrointestinal system: Abdomen is soft, nontender bowel sounds normal nondistended Central nervous system: Alert and oriented, grossly nonfocal Extremities: No cyanosis Skin: No rashes seen Psychiatry: Mood is appropriate    Data Reviewed: I have personally reviewed following labs and imaging studies  CBC: Recent Labs  Lab 12/16/19 1041 12/19/19 0222  WBC 7.3 7.0  NEUTROABS 5.1  --   HGB 14.8 15.1  HCT 46.2 46.3  MCV 107.7* 103.6*  PLT 211 817   Basic Metabolic Panel: Recent Labs  Lab 12/16/19 1041 12/17/19 0400 12/17/19 1103 12/18/19 0331 12/19/19 0222  NA 139 141  --  139 140  K 4.3 5.2* 4.6 3.5 3.1*  CL 102 101  --  95* 95*  CO2 28 27  --  30 32  GLUCOSE 122* 97  --  97 90  BUN 11 12  --  12 12  CREATININE 0.91 0.81  --  0.84 0.82  CALCIUM 9.3 8.9  --  9.2 9.2  MG  --   --   --  1.6*  --    GFR: Estimated Creatinine Clearance: 80.3 mL/min (by C-G formula based on SCr of 0.82 mg/dL). Liver Function Tests: Recent Labs  Lab 12/16/19 1041  AST 33  ALT 49*  ALKPHOS 52  BILITOT 1.2  PROT 6.5  ALBUMIN 3.6   Recent Labs  Lab 12/16/19 1041  LIPASE 16   Recent Labs  Lab  12/16/19 1041  AMMONIA 31   Coagulation Profile: No results for input(s): INR, PROTIME in the last 168 hours. Cardiac Enzymes: No results for input(s): CKTOTAL, CKMB, CKMBINDEX, TROPONINI in the last 168 hours. BNP (last 3 results) No results for input(s): PROBNP in the last 8760 hours. HbA1C: No results for input(s): HGBA1C in the last 72 hours. CBG: Recent Labs  Lab 12/16/19 1041  GLUCAP 112*   Lipid Profile: No results for input(s): CHOL, HDL, LDLCALC, TRIG, CHOLHDL, LDLDIRECT in the last 72 hours. Thyroid Function Tests: Recent Labs    12/16/19 1800  TSH 1.311   Anemia Panel: No results for input(s): VITAMINB12, FOLATE, FERRITIN, TIBC, IRON, RETICCTPCT in the last 72 hours. Sepsis Labs: Recent Labs  Lab 12/16/19 1058 12/16/19 1401  LATICACIDVEN 1.3 1.4    Recent Results (from the past 240 hour(s))  SARS CORONAVIRUS 2 (TAT 6-24 HRS) Nasopharyngeal Nasopharyngeal Swab     Status: None   Collection Time: 12/16/19 12:07 PM   Specimen: Nasopharyngeal Swab  Result Value Ref Range Status   SARS Coronavirus 2  NEGATIVE NEGATIVE Final    Comment: (NOTE) SARS-CoV-2 target nucleic acids are NOT DETECTED. The SARS-CoV-2 RNA is generally detectable in upper and lower respiratory specimens during the acute phase of infection. Negative results do not preclude SARS-CoV-2 infection, do not rule out co-infections with other pathogens, and should not be used as the sole basis for treatment or other patient management decisions. Negative results must be combined with clinical observations, patient history, and epidemiological information. The expected result is Negative. Fact Sheet for Patients: SugarRoll.be Fact Sheet for Healthcare Providers: https://www.woods-mathews.com/ This test is not yet approved or cleared by the [place] FDA and  has been authorized for detection and/or diagnosis of SARS-CoV-2 by FDA under an Emergency  Use Authorization (EUA). This EUA will remain  in effect (meaning this test can be used) for the duration of the COVID-19 declaration under Section 56 4(b)(1) of the Act, 21 U.S.C. section 360bbb-3(b)(1), unless the authorization is terminated or revoked sooner. Performed at [facility], [place] [address] [place], [place] [address]          Radiology Studies: No results found.      Scheduled Meds: . aspirin  81 mg Oral Daily  . [START ON 12/20/2019] aspirin  81 mg Oral Pre-Cath  . carvedilol  3.125 mg Oral BID WC  . doxycycline  100 mg Oral Q12H  . enoxaparin (LOVENOX) injection  40 mg Subcutaneous Q24H  . losartan  12.5 mg Oral Daily  . nicotine  21 mg Transdermal Daily  . pantoprazole  40 mg Oral Q0600  . potassium chloride  40 mEq Oral BID  . sodium chloride flush  3 mL Intravenous Q12H  . sodium chloride flush  3 mL Intravenous Q12H  . sucralfate  1 g Oral TID WC & HS  . traZODone  100 mg Oral QHS   Continuous Infusions: . sodium chloride    . sodium chloride    . [START ON 12/20/2019] sodium chloride    . cefTRIAXone (ROCEPHIN)  IV 2 g (12/19/19 1314)     LOS: 3 days        Hosie Poisson, MD [facility]   To contact the attending provider between 7A-7P or the covering provider during after hours 7P-7A, please log into the web site [email or web address] and access using universal [place] password for that web site. If you do not have the password, please call the hospital operator.  12/19/2019, 3:46 PM

## 2019-12-19 NOTE — Progress Notes (Addendum)
Patient Name: Cole Fuller Date of Encounter: 12/19/2019  Primary Cardiologist: Dr. Crista Curb Problem List     Principal Problem:   Acute exacerbation of CHF (congestive heart failure) (HCC) Active Problems:   PAD (peripheral artery disease) (HCC)   AAA (abdominal aortic aneurysm) (HCC)   Tobacco abuse   Chronic pain syndrome   Prolonged QT interval   Acute metabolic encephalopathy   Baker cyst   Adjustment disorder with depressed mood     Subjective   No complaints. Breathing improved. Okay to do heart cath tomorrow.   Inpatient Medications    Scheduled Meds: . aspirin  81 mg Oral Daily  . doxycycline  100 mg Oral Q12H  . enoxaparin (LOVENOX) injection  40 mg Subcutaneous Q24H  . furosemide  40 mg Intravenous BID  . losartan  12.5 mg Oral Daily  . nicotine  21 mg Transdermal Daily  . ondansetron (ZOFRAN) IV  4 mg Intravenous Once  . pantoprazole  40 mg Oral Q0600  . potassium chloride  40 mEq Oral Daily  . sodium chloride flush  3 mL Intravenous Q12H  . sucralfate  1 g Oral TID WC & HS  . traZODone  100 mg Oral QHS   Continuous Infusions: . sodium chloride    . cefTRIAXone (ROCEPHIN)  IV 2 g (12/18/19 1241)   PRN Meds: sodium chloride, acetaminophen, alprazolam, oxyCODONE-acetaminophen, sodium chloride flush   Vital Signs    Vitals:   12/18/19 0358 12/18/19 1500 12/18/19 1939 12/19/19 0238  BP: 129/81 109/81 109/64 115/76  Pulse: (!) 101 67 82 83  Resp: Temp: 97.6 F (36.4 C) 97.6 F (36.4 C) 98.1 F (36.7 C) 97.7 F (36.5 C)  TempSrc: Oral Oral Oral Oral  SpO2: 98% 96% 94% 92%  Weight:    66.8 kg  Height:        Intake/Output Summary (Last 24 hours) at 12/19/2019 0824 Last data filed at 12/19/2019 0700 Gross per 24 hour  Intake 2056 ml  Output 1630 ml  Net 426 ml   Filed Weights   12/16/19 2234 12/18/19 0325 12/19/19 0238  Weight: 72.6 kg 69.8 kg 66.8 kg    Physical Exam   GEN: thin elderly, chronically ill  appearing male HEENT: Grossly normal.  Neck: Supple, no JVD, carotid bruits, or masses. Cardiac: RRR, no murmurs, rubs, or gallops. No clubbing, cyanosis, 1+ bilateral LE edema. Respiratory: diminished breath sounds at bases  GI: Soft, nontender, nondistended, BS + x 4. MS: no deformity or atrophy. Skin: warm and dry, no rash. Neuro:  Strength and sensation are intact. Psych: AAOx3.  Normal affect.  Labs    CBC Recent Labs    12/16/19 1041 12/19/19 0222  WBC 7.3 7.0  NEUTROABS 5.1  --   HGB 14.8 15.1  HCT 46.2 46.3  MCV 107.7* 103.6*  PLT 211 173   Basic Metabolic Panel Recent Labs    16/10/96 0331 12/19/19 0222  NA 139 140  K 3.5 3.1*  CL 95* 95*  CO2 30 32  GLUCOSE 97 90  BUN 12 12  CREATININE 0.84 0.82  CALCIUM 9.2 9.2  MG 1.6*  --    Liver Function Tests Recent Labs    12/16/19 1041  AST 33  ALT 49*  ALKPHOS 52  BILITOT 1.2  PROT 6.5  ALBUMIN 3.6   Recent Labs    12/16/19 1041  LIPASE 16   Cardiac Enzymes No results for input(s): CKTOTAL,  CKMB, CKMBINDEX, TROPONINI in the last 72 hours. BNP Invalid input(s): POCBNP D-Dimer No results for input(s): DDIMER in the last 72 hours. Hemoglobin A1C No results for input(s): HGBA1C in the last 72 hours. Fasting Lipid Panel No results for input(s): CHOL, HDL, LDLCALC, TRIG, CHOLHDL, LDLDIRECT in the last 72 hours. Thyroid Function Tests Recent Labs    12/16/19 1800  TSH 1.311    Telemetry    Sinus tachy with run of 10 second run of NSVT - Personally Reviewed  ECG    Sinus with IVCD, HR 100 bpm, Qtc 534 ms - Personally Reviewed  Radiology    CT ABDOMEN PELVIS W CONTRAST  Result Date: 12/17/2019 CLINICAL DATA:  Nausea, vomiting, abdominal pain. EXAM: CT ABDOMEN AND PELVIS WITH CONTRAST TECHNIQUE: Multidetector CT imaging of the abdomen and pelvis was performed using the standard protocol following bolus administration of intravenous contrast. CONTRAST:  153mL OMNIPAQUE IOHEXOL 300 MG/ML  SOLN  COMPARISON:  None. FINDINGS: Lower chest: Mild right pleural effusion is noted with adjacent subsegmental atelectasis. Small pericardial effusion is noted. Hepatobiliary: Hepatic steatosis is noted. No gallstones, gallbladder wall thickening, or biliary dilatation. Pancreas: Unremarkable. No pancreatic ductal dilatation or surrounding inflammatory changes. Spleen: Normal in size without focal abnormality. Adrenals/Urinary Tract: Adrenal glands are unremarkable. Kidneys are normal, without renal calculi, focal lesion, or hydronephrosis. Bladder is unremarkable. Stomach/Bowel: Stomach is within normal limits. Appendix appears normal. No evidence of bowel wall thickening, distention, or inflammatory changes. Vascular/Lymphatic: 4 cm infrarenal abdominal aortic aneurysm is noted. Atherosclerosis of abdominal aorta is noted. No adenopathy is noted. Reproductive: Prostate is unremarkable. Other: No abdominal wall hernia or abnormality. No abdominopelvic ascites. Musculoskeletal: No acute or significant osseous findings. IMPRESSION: 1. Mild right pleural effusion is noted with adjacent subsegmental atelectasis. 2. Small pericardial effusion. 3. Hepatic steatosis. 4. 4 cm infrarenal abdominal aortic aneurysm is noted. Recommend followup by [place] in 1 year. This recommendation follows ACR consensus guidelines: White Paper of the ACR Incidental Findings Committee II on Vascular Findings. J Am Coll Radiol 2013; 10:789-794. Aortic Atherosclerosis (ICD10-I70.0). Electronically Signed   By: Marijo Conception M.D.   On: 12/17/2019 14:08   ECHOCARDIOGRAM COMPLETE  Result Date: 12/17/2019    ECHOCARDIOGRAM REPORT   Patient Name:   Cole Fuller Date of Exam: 12/17/2019 Medical Rec #:  [id number]       Height:       72.0 in Accession #:    [id number]      Weight:       160.0 lb Date of Birth:  August 03, 1951       BSA:          1.938 m Patient Age:    63 years        BP:           127/92 mmHg Patient Gender: M               HR:            102 bpm. Exam Location:  Inpatient Procedure: 2D Echo, 3D Echo, Color Doppler and Cardiac Doppler Indications:    D92.42 Acute diastolic (congestive) heart failure  History:        Patient has no prior history of Echocardiogram examinations.                 CHF, CAD, PAD; Risk Factors:Hypertension.  Sonographer:    Raquel Sarna Senior RDCS Referring Phys: [phone] [place]  1. Left ventricular ejection fraction, by estimation,  is 15-20%. The left ventricle has severely decreased function. The left ventricle demonstrates global hypokinesis. The left ventricular internal cavity size was severely dilated. Indeterminate diastolic filling due to E-A fusion.  2. Right ventricular systolic function is low normal. The right ventricular size is mildly enlarged. There is moderately elevated pulmonary artery systolic pressure. The estimated right ventricular systolic pressure is 45.2 mmHg.  3. Left atrial size was severely dilated.  4. Right atrial size was mild to moderately dilated.  5. The mitral valve is normal in structure. Moderate mitral valve regurgitation.  6. The aortic valve is normal in structure. Aortic valve regurgitation is not visualized.  7. The inferior vena cava is dilated in size with >50% respiratory variability, suggesting right atrial pressure of 8 mmHg. FINDINGS  Left Ventricle: Left ventricular ejection fraction, by estimation, is 15-20%. The left ventricle has severely decreased function. The left ventricle demonstrates global hypokinesis. Mild dyskinesis of the left ventricular, entire apical segment. The left ventricular internal cavity size was severely dilated. There is no left ventricular hypertrophy. Indeterminate diastolic filling due to E-A fusion. Right Ventricle: The right ventricular size is mildly enlarged. No increase in right ventricular wall thickness. Right ventricular systolic function is low normal. There is moderately elevated pulmonary artery systolic pressure. The  tricuspid regurgitant  velocity is 3.05 m/s, and with an assumed right atrial pressure of 8 mmHg, the estimated right ventricular systolic pressure is 45.2 mmHg. Left Atrium: Left atrial size was severely dilated. Right Atrium: Right atrial size was mild to moderately dilated. Pericardium: A small pericardial effusion is present. The pericardial effusion is circumferential. Mitral Valve: The mitral valve is normal in structure. Moderate mitral valve regurgitation, with centrally-directed jet. Tricuspid Valve: The tricuspid valve is normal in structure. Tricuspid valve regurgitation is mild. Aortic Valve: The aortic valve is normal in structure. Aortic valve regurgitation is not visualized. Pulmonic Valve: The pulmonic valve was grossly normal. Pulmonic valve regurgitation is not visualized. Aorta: The aortic root and ascending aorta are structurally normal, with no evidence of dilitation. Venous: The inferior vena cava is dilated in size with greater than 50% respiratory variability, suggesting right atrial pressure of 8 mmHg. IAS/Shunts: No atrial level shunt detected by color flow Doppler.  LEFT VENTRICLE PLAX 2D LVIDd:         7.50 cm LVIDs:         6.90 cm LV PW:         0.90 cm LV IVS:        0.80 cm LVOT diam:     2.30 cm LV SV:         32 LV SV Index:   16 LVOT Area:     4.15 cm  LV Volumes (MOD) LV vol d, MOD A2C: 333.0 ml LV vol d, MOD A4C: 337.0 ml LV vol s, MOD A2C: 315.0 ml LV vol s, MOD A4C: 254.0 ml LV SV MOD A2C:     18.0 ml LV SV MOD A4C:     337.0 ml LV SV MOD BP:      49.0 ml RIGHT VENTRICLE RV S prime:     10.20 cm/s TAPSE (M-mode): 1.3 cm LEFT ATRIUM              Index       RIGHT ATRIUM           Index LA diam:        3.90 cm  2.01 cm/m  RA Area:     27.20  cm LA Vol (A2C):   126.0 ml 65.02 ml/m RA Volume:   92.60 ml  47.79 ml/m LA Vol (A4C):   94.9 ml  48.97 ml/m LA Biplane Vol: 111.0 ml 57.28 ml/m  AORTIC VALVE LVOT Vmax:   72.50 cm/s LVOT Vmean:  51.300 cm/s LVOT VTI:    0.076 m  AORTA  Ao Root diam: 3.70 cm Ao Asc diam:  3.00 cm MITRAL VALVE                 TRICUSPID VALVE MV Area (PHT): 5.97 cm      TR Peak grad:   37.2 mmHg MV Decel Time: 127 msec      TR Vmax:        305.00 cm/s MR Peak grad:    72.6 mmHg MR Mean grad:    44.0 mmHg   SHUNTS MR Vmax:         426.00 cm/s Systemic VTI:  0.08 m MR Vmean:        313.0 cm/s  Systemic Diam: 2.30 cm MR PISA:         1.01 cm MR PISA Eff ROA: 9 mm MR PISA Radius:  0.40 cm MV E velocity: 109.00 cm/s Rachelle Hora Croitoru MD Electronically signed by Thurmon Fair MD Signature Date/Time: 12/17/2019/1:42:15 PM    Final     Cardiac Studies   ECHO 12/17/19 IMPRESSIONS  1. Left ventricular ejection fraction, by estimation, is 15-20%. The left  ventricle has severely decreased function. The left ventricle demonstrates  global hypokinesis. The left ventricular internal cavity size was severely  dilated. Indeterminate  diastolic filling due to E-A fusion.  2. Right ventricular systolic function is low normal. The right  ventricular size is mildly enlarged. There is moderately elevated  pulmonary artery systolic pressure. The estimated right ventricular  systolic pressure is 45.2 mmHg.  3. Left atrial size was severely dilated.  4. Right atrial size was mild to moderately dilated.  5. The mitral valve is normal in structure. Moderate mitral valve  regurgitation.  6. The aortic valve is normal in structure. Aortic valve regurgitation is  not visualized.  7. The inferior vena cava is dilated in size with >50% respiratory  variability, suggesting right atrial pressure of 8 mmHg.   Patient Profile     Cole Fuller is a 69 y.o. male with a hx of CAD with remote LCx PCI in 2004 per Dr. Allyson Sabal (recath 09/2004 with no intervention), hypertension, PAD, AAA, anxiety, arthritis, tobacco abuse, medical non complaince and chronic pain followed in pain clinic who was admitted to [facility] on 12/16/19 with AMS (felt to be due to narcotic overuse). He was found  to have acute CHF and cardiology consulted.   Assessment & Plan    1. Acute systolic CHF exacerbation:  On hospital presentation, patient found to be volume overloaded with a BNP of greater than 4500 and subsequent CXR with right-sided pleural effusion with possible infiltrates. His O2 saturations were suboptimal at 83% which improved with supplemental O2. CT shows mild right pleural effusion withatelectasisand small pericardial effusion is noted. He reported a 2 week hx of dyspnea on hospital presentation with mild LE edema.  -Echocardiogram4/9/21 with LVEF at 15-20% with global hypokinesis of the LV and entire apical segment, moderate MR and mild TR. Plan for [facility] Monday. Orders placed.  - Started on Losartan 12.5mg  daily this admission. Will add Coreg 3.125mg  BID now as he is more euvolemic  Cr: 0.84 > 0.82 UOP yesterday: 1.7L  Weight: 160--> 147 Net negative for admission: -2 L Diuretic plan: decrease IV lasix from 40mg  BID to 40 mg daily with an eye to switch to PO lasix 40mg  daily tomorrow. Will also fluid restrict patient to 1.5 L daily.   2. Community-acquired pneumonia: -CXR concerning for right lower lobe infiltrate possibly secondary to aspiration pna -Patient started on empiric antibiotics>>> currently on Rocephin and doxycycline  -Management per primary team  3. Elevated troponin with known hx of CAD: -High-sensitivity troponin found to be 81 with subsequent level at 80, not consistent with ACS -Elevation likely in the setting of acute volume overload -Has past hx ofCADs/pPCI of thecircumflex. He also has a known chronic and stable 95% distal small codominant RCA stenosis. -Stress test performed1/2006which showed mild inferior ischemia consistent with his known anatomy without evidence ofischemia in the circumflex or LAD distribution. -Last LHC performed pre-operatively in 2006 showedno evidence of restenosis withinhis circumflex stents. RCA remained stable and  was felt to possibly be responsible for inferior ischemic abnormality on stress test results. It was noted that if pain becomes more frequent or severe, he could possibly be a candidate for PCI of his RCA. -Denies chest pain -Continue ASA, statin, beta-blocker - As above, plan for [facility] tomorrow with new severe LV dysfunction   4. History of peripheral arterial disease: -Followed remotely with Dr. Truitt LeepFields/Dr. Allyson SabalBerry -Has past hx ofright superficial femoral artery stent placed by Dr. Allyson SabalBerry in 2004>>last seen by Dr. Darrick PennaFields forclaudication symptoms at which time plan was to continue with risk factor modification and smoking cessationandrepeat ABIs in 6 months. Unfortunately, he has not been seen since this time.  5. AAA: -UltrasoundSFA stent placement showeda small abdominal aortic aneurysm measuring 3.7 cm in diameter.Plan was to follow closely with serial studies however he has not been seen by our service or vascular team since that time.  -He has complaints ofabdominal pain on presentation with bruit per primary team's note -CT performed which shows AAA at 4cm in diameter. -Continue with close follow up and adequate BP control  -Palpable on exam, non-tender. No abdominal or additional symptoms to suggest change.Duplex 12/2015 stable.   6.Tobacco abuse: -Continues to smoke everyday  -Cessation strongly encouraged however patient has no intention of quitting  8.HTN: - Stable overall - losartan 12.5 mg daily started this admission. Coreg 3.125mg  BID added this AM.   9. Hypokalemia: K 3.1. Will supplement with 80 Meq today.   SignedCline Crock, Kathryn Thompson, PA-C  12/19/2019, 8:24 AM  Pager [phone]  Patient seen and examined with Cline CrockKathryn Thompson, PA-C.  Agree as above, with the following exceptions and changes as noted below. Patient appears comfortable now after eating, abdominal discomfort improved. No chest pain, no SOB, not on supplemental O2. Gen: NAD, CV: regular,  tachycardic, no murmurs, Lungs: clear, Abd: soft, Extrem: Warm, well perfused, trace pedal edema , Neuro/Psych: alert and oriented x 3, normal mood and affect. All available labs, radiology testing, previous records reviewed. Unclear etiology of reduced EF, also remains tachycardic. Would benefit from defining coronary anatomy (known CAD) and hemodynamic assessment in light of tachycardia. Appears warm and euvolemic, however is tachycardic which may be secondary to PNA vs. Low output HF and moderate MR. [facility], patient consented. I asked him to repeat back to me what we are planning to do, and he was able to do so, and also requested that we do this Monday if able.  Decrease lasix to 40 mg IV today and monitor given improved volume status. Fluid  restrict.  INFORMED CONSENT: I have reviewed the risks, indications, and alternatives to cardiac catheterization, possible angioplasty, and stenting with the patient. Risks include but are not limited to bleeding, infection, vascular injury, stroke, myocardial infection, arrhythmia, kidney injury, radiation-related injury in the case of prolonged fluoroscopy use, emergency cardiac surgery, and death. The patient understands the risks of serious complication is 1-2 in 1000 with diagnostic cardiac cath and 1-2% or less with angioplasty/stenting.    Parke Poisson, MD 12/19/19 9:10 AM

## 2019-12-19 NOTE — Progress Notes (Signed)
Held 1700 carvedilol for BP 89/63 HR 83.  Hinton Dyer, RN

## 2019-12-20 ENCOUNTER — Encounter (HOSPITAL_COMMUNITY)
Admission: EM | Disposition: A | Discharge status: Home Health | Payer: Self-pay | Source: Home / Self Care | Attending: Internal Medicine

## 2019-12-20 DIAGNOSIS — I429 Cardiomyopathy, unspecified: Secondary | ICD-10-CM

## 2019-12-20 DIAGNOSIS — I5023 Acute on chronic systolic (congestive) heart failure: Secondary | ICD-10-CM

## 2019-12-20 HISTORY — PX: RIGHT/LEFT HEART CATH AND CORONARY ANGIOGRAPHY: CATH118266

## 2019-12-20 LAB — POCT I-STAT EG7
Acid-Base Excess: 6 mmol/L — ABNORMAL HIGH (ref 0.0–2.0)
Bicarbonate: 34.4 mmol/L — ABNORMAL HIGH (ref 20.0–28.0)
Calcium, Ion: 1.23 mmol/L (ref 1.15–1.40)
HCT: 45 % (ref 39.0–52.0)
Hemoglobin: 15.3 g/dL (ref 13.0–17.0)
O2 Saturation: 70 %
Potassium: 4.3 mmol/L (ref 3.5–5.1)
Sodium: 139 mmol/L (ref 135–145)
TCO2: 36 mmol/L — ABNORMAL HIGH (ref 22–32)
pCO2, Ven: 67.9 mmHg — ABNORMAL HIGH (ref 44.0–60.0)
pH, Ven: 7.313 (ref 7.250–7.430)
pO2, Ven: 42 mmHg (ref 32.0–45.0)

## 2019-12-20 LAB — CBC WITH DIFFERENTIAL/PLATELET
Abs Immature Granulocytes: 0.01 10*3/uL (ref 0.00–0.07)
Basophils Absolute: 0.1 10*3/uL (ref 0.0–0.1)
Basophils Relative: 1 %
Eosinophils Absolute: 0 10*3/uL (ref 0.0–0.5)
Eosinophils Relative: 1 %
HCT: 44.9 % (ref 39.0–52.0)
Hemoglobin: 14.4 g/dL (ref 13.0–17.0)
Immature Granulocytes: 0 %
Lymphocytes Relative: 24 %
Lymphs Abs: 1.7 10*3/uL (ref 0.7–4.0)
MCH: 34.4 pg — ABNORMAL HIGH (ref 26.0–34.0)
MCHC: 32.1 g/dL (ref 30.0–36.0)
MCV: 107.2 fL — ABNORMAL HIGH (ref 80.0–100.0)
Monocytes Absolute: 0.6 10*3/uL (ref 0.1–1.0)
Monocytes Relative: 8 %
Neutro Abs: 4.7 10*3/uL (ref 1.7–7.7)
Neutrophils Relative %: 66 %
Platelets: 186 10*3/uL (ref 150–400)
RBC: 4.19 MIL/uL — ABNORMAL LOW (ref 4.22–5.81)
RDW: 14 % (ref 11.5–15.5)
WBC: 7.1 10*3/uL (ref 4.0–10.5)
nRBC: 0 % (ref 0.0–0.2)

## 2019-12-20 LAB — POCT I-STAT 7, (LYTES, BLD GAS, ICA,H+H)
Acid-Base Excess: 5 mmol/L — ABNORMAL HIGH (ref 0.0–2.0)
Bicarbonate: 32.8 mmol/L — ABNORMAL HIGH (ref 20.0–28.0)
Calcium, Ion: 1.24 mmol/L (ref 1.15–1.40)
HCT: 44 % (ref 39.0–52.0)
Hemoglobin: 15 g/dL (ref 13.0–17.0)
O2 Saturation: 95 %
Potassium: 4.3 mmol/L (ref 3.5–5.1)
Sodium: 138 mmol/L (ref 135–145)
TCO2: 35 mmol/L — ABNORMAL HIGH (ref 22–32)
pCO2 arterial: 63.6 mmHg — ABNORMAL HIGH (ref 32.0–48.0)
pH, Arterial: 7.321 — ABNORMAL LOW (ref 7.350–7.450)
pO2, Arterial: 84 mmHg (ref 83.0–108.0)

## 2019-12-20 LAB — BASIC METABOLIC PANEL
Anion gap: 9 (ref 5–15)
BUN: 16 mg/dL (ref 8–23)
CO2: 33 mmol/L — ABNORMAL HIGH (ref 22–32)
Calcium: 8.8 mg/dL — ABNORMAL LOW (ref 8.9–10.3)
Chloride: 95 mmol/L — ABNORMAL LOW (ref 98–111)
Creatinine, Ser: 0.87 mg/dL (ref 0.61–1.24)
GFR calc Af Amer: >60 mL/min (ref >60)
GFR calc non Af Amer: >60 mL/min (ref >60)
Glucose, Bld: 105 mg/dL — ABNORMAL HIGH (ref 70–99)
Potassium: 4.4 mmol/L (ref 3.5–5.1)
Sodium: 137 mmol/L (ref 135–145)

## 2019-12-20 LAB — MAGNESIUM: Magnesium: 2 mg/dL (ref 1.7–2.4)

## 2019-12-20 SURGERY — RIGHT/LEFT HEART CATH AND CORONARY ANGIOGRAPHY
Anesthesia: LOCAL

## 2019-12-20 MED ORDER — SODIUM CHLORIDE 0.9% FLUSH: 3.0000 mL | INTRAVENOUS | Status: DC | PRN

## 2019-12-20 MED ORDER — SODIUM CHLORIDE 0.9 % IV SOLN: 250.0000 mL | INTRAVENOUS | Status: DC | PRN

## 2019-12-20 MED ORDER — ENOXAPARIN SODIUM 40 MG/0.4ML ~~LOC~~ SOLN
40.0000 mg | SUBCUTANEOUS | Status: DC
  Administered 2019-12-21: 40 mg via SUBCUTANEOUS
  Filled 2019-12-20: qty 0.4

## 2019-12-20 MED ORDER — MIDAZOLAM HCL 2 MG/2ML IJ SOLN
INTRAMUSCULAR | Status: DC | PRN
  Administered 2019-12-20 (×2): 1 mg via INTRAVENOUS

## 2019-12-20 MED ORDER — FENTANYL CITRATE (PF) 100 MCG/2ML IJ SOLN
INTRAMUSCULAR | Status: DC | PRN
  Administered 2019-12-20 (×2): 25 ug via INTRAVENOUS

## 2019-12-20 MED ORDER — FENTANYL CITRATE (PF) 100 MCG/2ML IJ SOLN
INTRAMUSCULAR | Status: AC
  Filled 2019-12-20: qty 2

## 2019-12-20 MED ORDER — ROSUVASTATIN CALCIUM 20 MG PO TABS
40.0000 mg | ORAL_TABLET | Freq: Every day | ORAL | Status: DC
  Administered 2019-12-20: 19:00:00 40 mg via ORAL
  Filled 2019-12-20: qty 2

## 2019-12-20 MED ORDER — HEPARIN (PORCINE) IN NACL 1000-0.9 UT/500ML-% IV SOLN
INTRAVENOUS | Status: DC | PRN
  Administered 2019-12-20 (×2): 500 mL

## 2019-12-20 MED ORDER — IOHEXOL 350 MG/ML SOLN
INTRAVENOUS | Status: DC | PRN
  Administered 2019-12-20: 65 mL

## 2019-12-20 MED ORDER — HEPARIN (PORCINE) IN NACL 1000-0.9 UT/500ML-% IV SOLN
INTRAVENOUS | Status: AC
  Filled 2019-12-20: qty 1000

## 2019-12-20 MED ORDER — LIDOCAINE HCL (PF) 1 % IJ SOLN
INTRAMUSCULAR | Status: AC
  Filled 2019-12-20: qty 30

## 2019-12-20 MED ORDER — SODIUM CHLORIDE 0.9% FLUSH
3.0000 mL | Freq: Two times a day (BID) | INTRAVENOUS | Status: DC
  Administered 2019-12-20 – 2019-12-21 (×2): 3 mL via INTRAVENOUS

## 2019-12-20 MED ORDER — HEPARIN SODIUM (PORCINE) 1000 UNIT/ML IJ SOLN
INTRAMUSCULAR | Status: DC | PRN
  Administered 2019-12-20: 3500 [IU] via INTRAVENOUS

## 2019-12-20 MED ORDER — VERAPAMIL HCL 2.5 MG/ML IV SOLN
INTRAVENOUS | Status: DC | PRN
  Administered 2019-12-20: 10 mL via INTRA_ARTERIAL

## 2019-12-20 MED ORDER — LIDOCAINE HCL (PF) 1 % IJ SOLN
INTRAMUSCULAR | Status: DC | PRN
  Administered 2019-12-20: 5 mL

## 2019-12-20 MED ORDER — HEPARIN SODIUM (PORCINE) 1000 UNIT/ML IJ SOLN
INTRAMUSCULAR | Status: AC
  Filled 2019-12-20: qty 1

## 2019-12-20 MED ORDER — MIDAZOLAM HCL 2 MG/2ML IJ SOLN
INTRAMUSCULAR | Status: AC
  Filled 2019-12-20: qty 2

## 2019-12-20 SURGICAL SUPPLY — 11 items

## 2019-12-20 NOTE — H&P (View-Only) (Signed)
Progress Note  Patient Name: Cole Fuller Date of Encounter: 12/20/2019  Primary Cardiologist: Dr Dollene Cleveland  Subjective   Mildly lethargic, no CP or dyspnea  Inpatient Medications    Scheduled Meds: . aspirin  81 mg Oral Daily  . carvedilol  3.125 mg Oral BID WC  . doxycycline  100 mg Oral Q12H  . enoxaparin (LOVENOX) injection  40 mg Subcutaneous Q24H  . losartan  12.5 mg Oral Daily  . nicotine  21 mg Transdermal Daily  . pantoprazole  40 mg Oral Q0600  . potassium chloride  40 mEq Oral BID  . sodium chloride flush  3 mL Intravenous Q12H  . sodium chloride flush  3 mL Intravenous Q12H  . sucralfate  1 g Oral TID WC & HS  . traZODone  100 mg Oral QHS   Continuous Infusions: . sodium chloride    . sodium chloride    . sodium chloride 10 mL/hr at 12/20/19 0604  . cefTRIAXone (ROCEPHIN)  IV 2 g (12/19/19 1314)   PRN Meds: sodium chloride, sodium chloride, acetaminophen, alprazolam, oxyCODONE-acetaminophen, sodium chloride flush, sodium chloride flush   Vital Signs    Vitals:   12/19/19 2007 12/19/19 2017 12/19/19 2018 12/20/19 0500  BP: 97/66 101/69    Pulse: 87 88 88   Resp:    13  Temp:    98 F (36.7 C)  TempSrc:    Oral  SpO2: 94% 93% 93%   Weight:    68.8 kg  Height:        Intake/Output Summary (Last 24 hours) at 12/20/2019 0936 Last data filed at 12/19/2019 1848 Gross per 24 hour  Intake 706 ml  Output 475 ml  Net 231 ml   Last 3 Weights 12/20/2019 12/19/2019 12/18/2019  Weight (lbs) 151 lb 11.2 oz 147 lb 4.8 oz 153 lb 14.1 oz  Weight (kg) 68.811 kg 66.815 kg 69.8 kg      Telemetry    Sinus with 5 beats AIVR - Personally Reviewed  Physical Exam   GEN: No acute distress.   Neck: No JVD Cardiac: RRR, no murmurs, rubs, or gallops.  Respiratory: Clear to auscultation bilaterally. GI: Soft, nontender, non-distended  MS: No edema Neuro:  Nonfocal  Psych: Normal affect   Labs    High Sensitivity Troponin:   Recent Labs  Lab 12/16/19 1041  12/16/19 1401  TROPONINIHS 81* 80*      Chemistry Recent Labs  Lab 12/16/19 1041 12/17/19 0400 12/18/19 0331 12/19/19 0222 12/20/19 0259  NA 139   < > 139 140 137  K 4.3   < > 3.5 3.1* 4.4  CL 102   < > 95* 95* 95*  CO2 28   < > 30 32 33*  GLUCOSE 122*   < > 97 90 105*  BUN 11   < > 12 12 16   CREATININE 0.91   < > 0.84 0.82 0.87  CALCIUM 9.3   < > 9.2 9.2 8.8*  PROT 6.5  --   --   --   --   ALBUMIN 3.6  --   --   --   --   AST 33  --   --   --   --   ALT 49*  --   --   --   --   ALKPHOS 52  --   --   --   --   BILITOT 1.2  --   --   --   --  GFRNONAA >60   < > >60 >60 >60  GFRAA >60   < > >60 >60 >60  ANIONGAP 9   < > 14 13 9   < > = values in this interval not displayed.     Hematology Recent Labs  Lab 12/16/19 1041 12/19/19 0222 12/20/19 0259  WBC 7.3 7.0 7.1  RBC 4.29 4.47 4.19*  HGB 14.8 15.1 14.4  HCT 46.2 46.3 44.9  MCV 107.7* 103.6* 107.2*  MCH 34.5* 33.8 34.4*  MCHC 32.0 32.6 32.1  RDW 14.2 13.9 14.0  PLT 211 173 186    BNP Recent Labs  Lab 12/16/19 1041  BNP >4,500.0*    Radiology    DG CHEST PORT 1 VIEW  Result Date: 12/19/2019 CLINICAL DATA:  69-year-old male with pleural effusion and infiltrate. Follow-up chest radiograph. EXAM: PORTABLE CHEST 1 VIEW COMPARISON:  Chest radiograph dated 12/16/2019 FINDINGS: There is moderate to severe cardiomegaly. Small right pleural effusion and right lung base atelectasis or infiltrate similar or slightly improved since the prior radiograph. No pneumothorax. Atherosclerotic calcification of the aorta. No acute osseous pathology. IMPRESSION: Similar or slightly improved small right pleural effusion and right lung base atelectasis or infiltrate. Electronically Signed   By: Arash  Radparvar M.D.   On: 12/19/2019 17:47    Patient Profile     69 y.o. male with past medical history of coronary artery disease, peripheral vascular disease, hypertension, abdominal aortic aneurysm, chronic pain syndrome, tobacco  abuse admitted with altered mental status felt secondary to narcotic overuse found to have cardiomyopathy/CHF and cardiology asked to evaluate.  Echocardiogram shows ejection fraction 15 to 20%, biatrial enlargement, moderate mitral regurgitation.  Assessment & Plan    1 cardiomyopathy-etiology unclear.  Plan is for right and left cardiac catheterization today.  The risks and benefits including myocardial infarction, CVA and death discussed and he agrees to proceed.  We will continue low-dose carvedilol and losartan.  I cannot advance at this point as his blood pressure is borderline.  I do not think his blood pressure will tolerate Entresto at present.  Would need to demonstrate compliance with medications and follow-up as an outpatient before ICD would ever be considered.  2 acute systolic congestive heart failure-patient appears to be euvolemic on examination.  Will hold further Lasix and potassium today and reassess tomorrow.  Will review right heart catheterization when complete.  3 history of coronary artery disease-continue aspirin.  Add statin.  4 pneumonia-antibiotics per primary care.  5 abdominal aortic aneurysm-patient will need follow-up abdominal ultrasound April 2022.  6 chronic pain-Per primary care.  For questions or updates, please contact [facility] Please consult [email or web address] for contact info under        Signed, Brian Crenshaw, MD  12/20/2019, 9:36 AM    

## 2019-12-20 NOTE — Progress Notes (Signed)
Physical Therapy Treatment Patient Details Name: Cole Fuller MRN: [id number] DOB: Dec 20, 1950 Today's Date: 12/20/2019    History of Present Illness 69 y.o. male with medical history significant of CAD s/p stent in 2004, HTN, PAD s/p stent , AAA, anxiety, arthritis, tobacco abuse, and chronic pain presents after being found to be altered 4/8. Woke up disoriented, unable to walk w/ abdominal pain. Admitted with acute CHF exacerbation    PT Comments    Pt admitted with above diagnosis. Pt was able to ambulate in hallway needing min guard assist for steadying at times.  Pt progressing well overall.  Would be safer with device given that he does stagger at times.  Pt sats down to 88% on RA but improves with pursed lip breathing.  O2 sats 94% at rest on RA.  Replaced O2 as it was on on arrival and sats 98% on 2L.   Pt currently with functional limitations due to balance and endurance deficits. Pt will benefit from skilled PT to increase their independence and safety with mobility to allow discharge to the venue listed below.     Follow Up Recommendations  Home health PT;Supervision for mobility/OOB     Equipment Recommendations  Other (comment)(TBD)    Recommendations for Other Services       Precautions / Restrictions Precautions Precautions: Fall Restrictions Weight Bearing Restrictions: No    Mobility  Bed Mobility Overal bed mobility: Needs Assistance Bed Mobility: Supine to Sit     Supine to sit: Supervision     General bed mobility comments: able to get to long sit with mod I  Transfers Overall transfer level: Needs assistance Equipment used: None Transfers: Sit to/from Stand Sit to Stand: Min guard            Ambulation/Gait Ambulation/Gait assistance: Min guard Gait Distance (Feet): 200 Feet Assistive device: None Gait Pattern/deviations: Step-through pattern;Decreased stride length;Staggering left;Staggering right;Drifts right/left Gait velocity:  slowed Gait velocity interpretation: <1.31 ft/sec, indicative of household ambulator General Gait Details: Pt able to progress distance.  Pt does need occasional steadying assist as he does stagger occasionally and he self corrects most of the time but is at risk for falls without device.  Desat to 88% on RA and needed pursed lip breathing to improve O2 >90%.  Pt does appear to lack safety awareness not being aware of his unsteadiness at times.    Stairs             Wheelchair Mobility    Modified Rankin (Stroke Patients Only)       Balance Overall balance assessment: Needs assistance Sitting-balance support: Feet supported Sitting balance-Leahy Scale: Good     Standing balance support: During functional activity;No upper extremity supported Standing balance-Leahy Scale: Fair Standing balance comment: Pt can stand statically without LOB. Pt staggers occasionally in hallway needing steadying assist.                              Cognition Arousal/Alertness: Lethargic Behavior During Therapy: WFL for tasks assessed/performed Overall Cognitive Status: No family/caregiver present to determine baseline cognitive functioning                                        Exercises      General Comments        Pertinent Vitals/Pain Pain Assessment: No/denies pain  Home Living                      Prior Function            PT Goals (current goals can now be found in the care plan section) Acute Rehab PT Goals Patient Stated Goal: get stronger Progress towards PT goals: Progressing toward goals    Frequency    Min 3X/week      PT Plan Current plan remains appropriate    Co-evaluation              AM-PAC PT "6 Clicks" Mobility   Outcome Measure  Help needed turning from your back to your side while in a flat bed without using bedrails?: None Help needed moving from lying on your back to sitting on the side of a flat  bed without using bedrails?: None Help needed moving to and from a bed to a chair (including a wheelchair)?: A Little Help needed standing up from a chair using your arms (e.g., wheelchair or bedside chair)?: A Little Help needed to walk in hospital room?: A Little Help needed climbing 3-5 steps with a railing? : A Lot 6 Click Score: 19    End of Session Equipment Utilized During Treatment: Oxygen;Gait belt Activity Tolerance: Patient limited by fatigue Patient left: in chair;with call bell/phone within reach;with chair alarm set Nurse Communication: Mobility status PT Visit Diagnosis: Other abnormalities of gait and mobility (R26.89);Muscle weakness (generalized) (M62.81)     Time: 2706-2376 PT Time Calculation (min) (ACUTE ONLY): 17 min  Charges:  $Gait Training: 8-22 mins                     Keajah Killough W,PT [place] Pager:  [phone]  Office:  [place] 12/20/2019, 11:54 AM

## 2019-12-20 NOTE — Progress Notes (Signed)
PROGRESS NOTE    Cole Fuller  IEP:[id number] DOB: 02-Nov-1950 DOA: 12/16/2019 PCP: Osa Craver, MD    Brief Narrative:   69-year-old gentleman prior history of coronary artery disease s/p PCI in 2004, peripheral artery disease s/p stent, AAA, hypertension, ongoing tobacco abuse, chronic pain syndrome was brought into ED for altered mental status on 12/16/2019 his chest x-ray showed right basilar infiltrate with associated effusion concerning for right-sided pneumonia patient was started on empiric antibiotics and referred to [facility] for admission. Cardiology consulted and patient underwent cardiac catheterization showing the below, recommending medical management.       Assessment & Plan:   Principal Problem:   Acute exacerbation of CHF (congestive heart failure) (HCC) Active Problems:   PAD (peripheral artery disease) (HCC)   AAA (abdominal aortic aneurysm) (HCC)   Tobacco abuse   Chronic pain syndrome   Prolonged QT interval   Acute metabolic encephalopathy   Baker cyst   Adjustment disorder with depressed mood   Community-acquired pneumonia/right-sided pneumonia Continue with IV Rocephin and doxycycline. Follow up CXR ordered , showed improving infiltrates and effusions.   Blood cultures have been negative so far and urine streptococcal and Legionella antigen have been negative. Nasal cannula oxygen to keep sats greater than 90%. No chest pain today ,.      Acute systolic heart failure associated evident with right-sided pleural effusion bilateral lower extremity edema: Improving Echocardiogram reveals Left ventricular ejection fraction, by estimation, is 15-20%. The left ventricle has severely decreased function. The left ventricle demonstrates global hypokinesis. The left ventricular internal cavity size was severely dilated. Indeterminate diastolic filling due to E-A fusion. 2. Right ventricular systolic function is low normal. The right ventricular size is mildly  enlarged. There is moderately elevated pulmonary artery systolic pressure. The estimated right ventricular systolic pressure is 66.0 mmHg.  follow strict intake and output and daily weights, patient diuresed about 2.1 L since admission.  Cardiology consulted and plan for right/left heart cath today, see results below, recommending medical management.     Prolonged QT interval Recheck QTC in am.  Replaced potassium and magnesium Keep potassium greater than 4 and magnesium greater than 2.     Chronic pain syndrome Resume home medications at this time.    Peripheral artery disease Resume aspirin   Acute encephalopathy probably secondary to medications. Resolved at this time. Patient reports being depressed, chronic pain and anxiety. unfortunately patient has been let go from the pain clinic recently. Psychiatric consulted for medication and Xanax has been increased to 1 mg 3 times daily as needed. Appreciate recommendation added trazodone at bedtime.   Nausea, dry heaving and lower abdominal discomfort going on for many weeks to months CT of the abdomen pelvis does not show any acute intra-abdominal pathology that would explain the symptoms. Symptomatic management with antiemetics, PPI and sucralfate Patient also reports taking multiple pain medication.  Sucralfate and PPI ordered for possible gastritis. Patient currently denies any rectal bleed or melanotic stools.  CBC within normal limits  DVT prophylaxis: Lovenox  code Status: Full code  family Communication: Family at bedside Disposition Plan:  . Patient came from: Home            . Anticipated d/c place: Probably home with home PT . Barriers to d/c OR conditions which need to be met to effect a safe d/c: Ongoing treatment for pneumonia , possible d/c home in 1 to 2 days.    Consultants:   Cardiology.   Procedures: Echocardiogram\\  Cardiac catheterization :  Prox LAD to Mid LAD lesion is 40% stenosed.  Ost  Cx to Mid Cx lesion is 100% stenosed.  Prox RCA lesion is 90% stenosed.  LV end diastolic pressure is mildly elevated.  Hemodynamic findings consistent with mild pulmonary hypertension.   1. 2 vessel obstructive CAD    - 100% proximal LCX at site of prior stent. This was a large vessel. No collaterals.    - 90% small co dominant RCA 2. Mildly elevated LV filling pressures 3. Mild pulmonary HTN 4. Preserved cardiac output.   Antimicrobials: Rocephin and doxycycline since admission  Subjective: Nausea, resolved.  abd pain improved. No chest pain today.   Objective: Vitals:   12/20/19 1350 12/20/19 1405 12/20/19 1420 12/20/19 1435  BP: 98/63 99/72 92/65  93/71  Pulse: 81 86 83 88  Resp:      Temp:      TempSrc:      SpO2: 96% 94% 95% 96%  Weight:      Height:        Intake/Output Summary (Last 24 hours) at 12/20/2019 1923 Last data filed at 12/20/2019 1000 Gross per 24 hour  Intake --  Output 275 ml  Net -275 ml   Filed Weights   12/18/19 0325 12/19/19 0238 12/20/19 0500  Weight: 69.8 kg 66.8 kg 68.8 kg    Examination:  General exam: Alert and comfortable.  Respiratory system: air entry fair, no wheezing or rhonchi.  Cardiovascular system: S1S2 RRR no JVD, improving pedal edema.  Gastrointestinal system: abd is soft, non tender non distended. Bowel sounds wnl.  Central nervous system: alert and oriented.  Extremities: improving leg edema.  Skin: no rashes seen.  Psychiatry: Mood is appropriate    Data Reviewed: I have personally reviewed following labs and imaging studies  CBC: Recent Labs  Lab 12/16/19 1041 12/19/19 0222 12/20/19 0259 12/20/19 1214 12/20/19 1217  WBC 7.3 7.0 7.1  --   --   NEUTROABS 5.1  --  4.7  --   --   HGB 14.8 15.1 14.4 15.0 15.3  HCT 46.2 46.3 44.9 44.0 45.0  MCV 107.7* 103.6* 107.2*  --   --   PLT 211 173 186  --   --    Basic Metabolic Panel: Recent Labs  Lab 12/16/19 1041 12/16/19 1041 12/17/19 0400 12/17/19 1103  12/18/19 0331 12/19/19 0222 12/20/19 0259 12/20/19 1214 12/20/19 1217  NA 139   < > 141  --  139 140 137 138 139  K 4.3   < > 5.2*   < > 3.5 3.1* 4.4 4.3 4.3  CL 102  --  101  --  95* 95* 95*  --   --   CO2 28  --  27  --  30 32 33*  --   --   GLUCOSE 122*  --  97  --  97 90 105*  --   --   BUN 11  --  12  --  12 12 16   --   --   CREATININE 0.91  --  0.81  --  0.84 0.82 0.87  --   --   CALCIUM 9.3  --  8.9  --  9.2 9.2 8.8*  --   --   MG  --   --   --   --  1.6* 1.7 2.0  --   --    < > = values in this interval not displayed.   GFR: Estimated Creatinine Clearance:  78 mL/min (by C-G formula based on SCr of 0.87 mg/dL). Liver Function Tests: Recent Labs  Lab 12/16/19 1041  AST 33  ALT 49*  ALKPHOS 52  BILITOT 1.2  PROT 6.5  ALBUMIN 3.6   Recent Labs  Lab 12/16/19 1041  LIPASE 16   Recent Labs  Lab 12/16/19 1041  AMMONIA 31   Coagulation Profile: No results for input(s): INR, PROTIME in the last 168 hours. Cardiac Enzymes: No results for input(s): CKTOTAL, CKMB, CKMBINDEX, TROPONINI in the last 168 hours. BNP (last 3 results) No results for input(s): PROBNP in the last 8760 hours. HbA1C: No results for input(s): HGBA1C in the last 72 hours. CBG: Recent Labs  Lab 12/16/19 1041  GLUCAP 112*   Lipid Profile: No results for input(s): CHOL, HDL, LDLCALC, TRIG, CHOLHDL, LDLDIRECT in the last 72 hours. Thyroid Function Tests: No results for input(s): TSH, T4TOTAL, FREET4, T3FREE, THYROIDAB in the last 72 hours. Anemia Panel: No results for input(s): VITAMINB12, FOLATE, FERRITIN, TIBC, IRON, RETICCTPCT in the last 72 hours. Sepsis Labs: Recent Labs  Lab 12/16/19 1058 12/16/19 1401  LATICACIDVEN 1.3 1.4    Recent Results (from the past 240 hour(s))  SARS CORONAVIRUS 2 (TAT 6-24 HRS) Nasopharyngeal Nasopharyngeal Swab     Status: None   Collection Time: 12/16/19 12:07 PM   Specimen: Nasopharyngeal Swab  Result Value Ref Range Status   SARS Coronavirus 2  NEGATIVE NEGATIVE Final    Comment: (NOTE) SARS-CoV-2 target nucleic acids are NOT DETECTED. The SARS-CoV-2 RNA is generally detectable in upper and lower respiratory specimens during the acute phase of infection. Negative results do not preclude SARS-CoV-2 infection, do not rule out co-infections with other pathogens, and should not be used as the sole basis for treatment or other patient management decisions. Negative results must be combined with clinical observations, patient history, and epidemiological information. The expected result is Negative. Fact Sheet for Patients: SugarRoll.be Fact Sheet for Healthcare Providers: https://www.woods-mathews.com/ This test is not yet approved or cleared by the [place] FDA and  has been authorized for detection and/or diagnosis of SARS-CoV-2 by FDA under an Emergency Use Authorization (EUA). This EUA will remain  in effect (meaning this test can be used) for the duration of the COVID-19 declaration under Section 56 4(b)(1) of the Act, 21 U.S.C. section 360bbb-3(b)(1), unless the authorization is terminated or revoked sooner. Performed at [facility], [place] [address], [place], [place] [address]          Radiology Studies: CARDIAC CATHETERIZATION  Result Date: 12/20/2019  Prox LAD to Mid LAD lesion is 40% stenosed.  Ost Cx to Mid Cx lesion is 100% stenosed.  Prox RCA lesion is 90% stenosed.  LV end diastolic pressure is mildly elevated.  Hemodynamic findings consistent with mild pulmonary hypertension.  1. 2 vessel obstructive CAD    - 100% proximal LCX at site of prior stent. This was a large vessel. No collaterals.    - 90% small co dominant RCA 2. Mildly elevated LV filling pressures 3. Mild pulmonary HTN 4. Preserved cardiac output. Plan: recommend medical management. The LCx is not salvageable. The RCA is quite small- only 2-2.25 mm diameter.   DG CHEST PORT 1 VIEW  Result  Date: 12/19/2019 CLINICAL DATA:  69 year old male with pleural effusion and infiltrate. Follow-up chest radiograph. EXAM: PORTABLE CHEST 1 VIEW COMPARISON:  Chest radiograph dated 12/16/2019 FINDINGS: There is moderate to severe cardiomegaly. Small right pleural effusion and right lung base atelectasis or infiltrate similar  or slightly improved since the prior radiograph. No pneumothorax. Atherosclerotic calcification of the aorta. No acute osseous pathology. IMPRESSION: Similar or slightly improved small right pleural effusion and right lung base atelectasis or infiltrate. Electronically Signed   By: Anner Crete M.D.   On: 12/19/2019 17:47        Scheduled Meds: . aspirin  81 mg Oral Daily  . carvedilol  3.125 mg Oral BID WC  . doxycycline  100 mg Oral Q12H  . [START ON 12/21/2019] enoxaparin (LOVENOX) injection  40 mg Subcutaneous Q24H  . losartan  12.5 mg Oral Daily  . nicotine  21 mg Transdermal Daily  . pantoprazole  40 mg Oral Q0600  . rosuvastatin  40 mg Oral q1800  . sodium chloride flush  3 mL Intravenous Q12H  . sodium chloride flush  3 mL Intravenous Q12H  . sodium chloride flush  3 mL Intravenous Q12H  . sucralfate  1 g Oral TID WC & HS  . traZODone  100 mg Oral QHS   Continuous Infusions: . sodium chloride    . sodium chloride       LOS: 4 days        Hosie Poisson, MD [facility]   To contact the attending provider between 7A-7P or the covering provider during after hours 7P-7A, please log into the web site [email or web address] and access using universal [place] password for that web site. If you do not have the password, please call the hospital operator.  12/20/2019, 7:23 PM

## 2019-12-20 NOTE — Interval H&P Note (Signed)
History and Physical Interval Note:  12/20/2019 11:50 AM  Cole Fuller  has presented today for surgery, with the diagnosis of NSTEMI.  The various methods of treatment have been discussed with the patient and family. After consideration of risks, benefits and other options for treatment, the patient has consented to  Procedure(s): RIGHT/LEFT HEART CATH AND CORONARY ANGIOGRAPHY (N/A) as a surgical intervention.  The patient's history has been reviewed, patient examined, no change in status, stable for surgery.  I have reviewed the patient's chart and labs.  Questions were answered to the patient's satisfaction.   Cath Lab Visit (complete for each Cath Lab visit)  Clinical Evaluation Leading to the Procedure:   ACS: No.  Non-ACS:    Anginal Classification: CCS II  Anti-ischemic medical therapy: Minimal Therapy (1 class of medications)  Non-Invasive Test Results: No non-invasive testing performed  Prior CABG: No previous CABG        Theron Arista [facility] 12/20/2019 11:50 AM

## 2019-12-20 NOTE — Care Management Important Message (Signed)
Important Message  Patient Details  Name: Cole Fuller MRN: [id number] Date of Birth: 09-13-1950   Medicare Important Message Given:  Yes     Renie Ora 12/20/2019, 11:29 AM

## 2019-12-20 NOTE — Progress Notes (Signed)
Progress Note  Patient Name: Cole Fuller Date of Encounter: 12/20/2019  Primary Cardiologist: Dr Dollene Cleveland  Subjective   Mildly lethargic, no CP or dyspnea  Inpatient Medications    Scheduled Meds: . aspirin  81 mg Oral Daily  . carvedilol  3.125 mg Oral BID WC  . doxycycline  100 mg Oral Q12H  . enoxaparin (LOVENOX) injection  40 mg Subcutaneous Q24H  . losartan  12.5 mg Oral Daily  . nicotine  21 mg Transdermal Daily  . pantoprazole  40 mg Oral Q0600  . potassium chloride  40 mEq Oral BID  . sodium chloride flush  3 mL Intravenous Q12H  . sodium chloride flush  3 mL Intravenous Q12H  . sucralfate  1 g Oral TID WC & HS  . traZODone  100 mg Oral QHS   Continuous Infusions: . sodium chloride    . sodium chloride    . sodium chloride 10 mL/hr at 12/20/19 0604  . cefTRIAXone (ROCEPHIN)  IV 2 g (12/19/19 1314)   PRN Meds: sodium chloride, sodium chloride, acetaminophen, alprazolam, oxyCODONE-acetaminophen, sodium chloride flush, sodium chloride flush   Vital Signs    Vitals:   12/19/19 2007 12/19/19 2017 12/19/19 2018 12/20/19 0500  BP: 97/66 101/69    Pulse: 87 88 88   Resp:    13  Temp:    98 F (36.7 C)  TempSrc:    Oral  SpO2: 94% 93% 93%   Weight:    68.8 kg  Height:        Intake/Output Summary (Last 24 hours) at 12/20/2019 0936 Last data filed at 12/19/2019 1848 Gross per 24 hour  Intake 706 ml  Output 475 ml  Net 231 ml   Last 3 Weights 12/20/2019 12/19/2019 12/18/2019  Weight (lbs) 151 lb 11.2 oz 147 lb 4.8 oz 153 lb 14.1 oz  Weight (kg) 68.811 kg 66.815 kg 69.8 kg      Telemetry    Sinus with 5 beats AIVR - Personally Reviewed  Physical Exam   GEN: No acute distress.   Neck: No JVD Cardiac: RRR, no murmurs, rubs, or gallops.  Respiratory: Clear to auscultation bilaterally. GI: Soft, nontender, non-distended  MS: No edema Neuro:  Nonfocal  Psych: Normal affect   Labs    High Sensitivity Troponin:   Recent Labs  Lab 12/16/19 1041  12/16/19 1401  TROPONINIHS 81* 80*      Chemistry Recent Labs  Lab 12/16/19 1041 12/17/19 0400 12/18/19 0331 12/19/19 0222 12/20/19 0259  NA 139   < > 139 140 137  K 4.3   < > 3.5 3.1* 4.4  CL 102   < > 95* 95* 95*  CO2 28   < > 30 32 33*  GLUCOSE 122*   < > 97 90 105*  BUN 11   < > 12 12 16   CREATININE 0.91   < > 0.84 0.82 0.87  CALCIUM 9.3   < > 9.2 9.2 8.8*  PROT 6.5  --   --   --   --   ALBUMIN 3.6  --   --   --   --   AST 33  --   --   --   --   ALT 49*  --   --   --   --   ALKPHOS 52  --   --   --   --   BILITOT 1.2  --   --   --   --  GFRNONAA >60   < > >60 >60 >60  GFRAA >60   < > >60 >60 >60  ANIONGAP 9   < > 14 13 9    < > = values in this interval not displayed.     Hematology Recent Labs  Lab 12/16/19 1041 12/19/19 0222 12/20/19 0259  WBC 7.3 7.0 7.1  RBC 4.29 4.47 4.19*  HGB 14.8 15.1 14.4  HCT 46.2 46.3 44.9  MCV 107.7* 103.6* 107.2*  MCH 34.5* 33.8 34.4*  MCHC 32.0 32.6 32.1  RDW 14.2 13.9 14.0  PLT 211 173 186    BNP Recent Labs  Lab 12/16/19 1041  BNP >4,500.0*    Radiology    DG CHEST PORT 1 VIEW  Result Date: 12/19/2019 CLINICAL DATA:  69 year old male with pleural effusion and infiltrate. Follow-up chest radiograph. EXAM: PORTABLE CHEST 1 VIEW COMPARISON:  Chest radiograph dated 12/16/2019 FINDINGS: There is moderate to severe cardiomegaly. Small right pleural effusion and right lung base atelectasis or infiltrate similar or slightly improved since the prior radiograph. No pneumothorax. Atherosclerotic calcification of the aorta. No acute osseous pathology. IMPRESSION: Similar or slightly improved small right pleural effusion and right lung base atelectasis or infiltrate. Electronically Signed   By: Anner Crete M.D.   On: 12/19/2019 17:47    Patient Profile     69 y.o. male with past medical history of coronary artery disease, peripheral vascular disease, hypertension, abdominal aortic aneurysm, chronic pain syndrome, tobacco  abuse admitted with altered mental status felt secondary to narcotic overuse found to have cardiomyopathy/CHF and cardiology asked to evaluate.  Echocardiogram shows ejection fraction 15 to 20%, biatrial enlargement, moderate mitral regurgitation.  Assessment & Plan    1 cardiomyopathy-etiology unclear.  Plan is for right and left cardiac catheterization today.  The risks and benefits including myocardial infarction, CVA and death discussed and he agrees to proceed.  We will continue low-dose carvedilol and losartan.  I cannot advance at this point as his blood pressure is borderline.  I do not think his blood pressure will tolerate Entresto at present.  Would need to demonstrate compliance with medications and follow-up as an outpatient before ICD would ever be considered.  2 acute systolic congestive heart failure-patient appears to be euvolemic on examination.  Will hold further Lasix and potassium today and reassess tomorrow.  Will review right heart catheterization when complete.  3 history of coronary artery disease-continue aspirin.  Add statin.  4 pneumonia-antibiotics per primary care.  5 abdominal aortic aneurysm-patient will need follow-up abdominal ultrasound April 2022.  6 chronic pain-Per primary care.  For questions or updates, please contact [place] Please consult [email or web address] for contact info under        Signed, Kirk Ruths, MD  12/20/2019, 9:36 AM

## 2019-12-21 ENCOUNTER — Encounter (HOSPITAL_COMMUNITY): Payer: Self-pay

## 2019-12-21 DIAGNOSIS — J9601 Acute respiratory failure with hypoxia: Secondary | ICD-10-CM

## 2019-12-21 LAB — BASIC METABOLIC PANEL
Anion gap: 9 (ref 5–15)
BUN: 11 mg/dL (ref 8–23)
CO2: 28 mmol/L (ref 22–32)
Calcium: 8.9 mg/dL (ref 8.9–10.3)
Chloride: 99 mmol/L (ref 98–111)
Creatinine, Ser: 0.72 mg/dL (ref 0.61–1.24)
GFR calc Af Amer: >60 mL/min (ref >60)
GFR calc non Af Amer: >60 mL/min (ref >60)
Glucose, Bld: 104 mg/dL — ABNORMAL HIGH (ref 70–99)
Potassium: 4 mmol/L (ref 3.5–5.1)
Sodium: 136 mmol/L (ref 135–145)

## 2019-12-21 MED ORDER — NICOTINE 21 MG/24HR TD PT24: 21.0000 mg | MEDICATED_PATCH | Freq: Every day | TRANSDERMAL | 0 refills | Status: AC

## 2019-12-21 MED ORDER — FUROSEMIDE 20 MG PO TABS: 20.0000 mg | ORAL_TABLET | Freq: Every day | ORAL | 1 refills | Status: AC

## 2019-12-21 MED ORDER — FUROSEMIDE 20 MG PO TABS
20.0000 mg | ORAL_TABLET | Freq: Every day | ORAL | Status: DC
  Administered 2019-12-21: 11:00:00 20 mg via ORAL
  Filled 2019-12-21: qty 1

## 2019-12-21 MED ORDER — LOSARTAN POTASSIUM 25 MG PO TABS: 12.5000 mg | ORAL_TABLET | Freq: Every day | ORAL | 1 refills | Status: AC

## 2019-12-21 MED ORDER — ROSUVASTATIN CALCIUM 40 MG PO TABS: 40.0000 mg | ORAL_TABLET | Freq: Every day | ORAL | 1 refills | Status: AC

## 2019-12-21 MED ORDER — ALPRAZOLAM 1 MG PO TABS: 1.0000 mg | ORAL_TABLET | Freq: Three times a day (TID) | ORAL | 0 refills | Status: DC | PRN

## 2019-12-21 MED ORDER — ASPIRIN 81 MG PO CHEW: 81.0000 mg | CHEWABLE_TABLET | Freq: Every day | ORAL | 1 refills | Status: AC

## 2019-12-21 MED ORDER — PANTOPRAZOLE SODIUM 40 MG PO TBEC: 40.0000 mg | DELAYED_RELEASE_TABLET | Freq: Every day | ORAL | 1 refills | Status: DC

## 2019-12-21 MED ORDER — CARVEDILOL 3.125 MG PO TABS: 3.1250 mg | ORAL_TABLET | Freq: Two times a day (BID) | ORAL | 0 refills | Status: AC

## 2019-12-21 MED ORDER — TRAZODONE HCL 100 MG PO TABS: 100.0000 mg | ORAL_TABLET | Freq: Every day | ORAL | 0 refills | Status: AC

## 2019-12-21 NOTE — Discharge Summary (Signed)
Physician Discharge Summary  Cole Fuller WUJ:[id number] DOB: 08/17/1951 DOA: 12/16/2019  PCP: Cole HongBrown, [name], MD  Admit date: 12/16/2019 Discharge date: 12/21/2019  Admitted From: Home.  Disposition:  Home.   Recommendations for Outpatient Follow-up:  1. Follow up with PCP in 1-2 weeks 2. Please obtain BMP/CBC in one week Please follow up with cardiology as recommended.  Please follow up with pain clinic in one week.   Discharge Condition: stable.  CODE STATUS: full code.  Diet recommendation: Heart Healthy /    Brief/Interim Summary: Community-acquired pneumonia/right-sided pneumonia Continue with IV Rocephin and doxycycline. Follow up CXR ordered , showed improving infiltrates and effusions.   Blood cultures have been negative so far and urine streptococcal and Legionella antigen have been negative. Nasal cannula oxygen to keep sats greater than 90%. No chest pain today ,.      Acute systolic heart failure associated evident with right-sided pleural effusion bilateral lower extremity edema: Improving Echocardiogram reveals Left ventricular ejection fraction, by estimation, is 15-20%. The left ventricle has severely decreased function. The left ventricle demonstrates global hypokinesis. The left ventricular internal cavity size was severely dilated. Indeterminate diastolic filling due to E-A fusion. 2. Right ventricular systolic function is low normal. The right ventricular size is mildly enlarged. There is moderately elevated pulmonary artery systolic pressure. The estimated right ventricular systolic pressure is 45.2 mmHg.  follow strict intake and output and daily weights, patient diuresed about 2.1 Fuller since admission.  Cardiology consulted and plan for right/left heart cath today, see results below, recommending medical management.     Prolonged QT interval Probably secondary to low potassium and magnesium Keep potassium greater than 4 and magnesium greater  than 2.     Chronic pain syndrome Resume home medications at this time.    Peripheral artery disease Resume aspirin   Acute encephalopathy probably secondary to medications. Resolved at this time. Patient reports being depressed, chronic pain and anxiety. unfortunately patient has been let go from the pain clinic recently because he tested positive for pain meds.  Psychiatric consulted for medication and Xanax has been increased to 1 mg 3 times daily as needed. Appreciate recommendation added trazodone at bedtime.   Nausea, dry heaving and lower abdominal discomfort going on for many weeks to months CT of the abdomen pelvis does not show any acute intra-abdominal pathology that would explain the symptoms. Symptomatic management with antiemetics, PPI and sucralfate Patient also reports taking multiple pain medication.  Sucralfate and PPI ordered for possible gastritis. Patient currently denies any rectal bleed or melanotic stools.  CBC within normal limits   Discharge Diagnoses:  Principal Problem:   Acute exacerbation of CHF (congestive heart failure) (HCC) Active Problems:   PAD (peripheral artery disease) (HCC)   AAA (abdominal aortic aneurysm) (HCC)   Tobacco abuse   Chronic pain syndrome   Prolonged QT interval   Acute metabolic encephalopathy   Baker cyst   Adjustment disorder with depressed mood    Discharge Instructions  Discharge Instructions    Diet - low sodium heart healthy   Complete by: As directed    Increase activity slowly   Complete by: As directed      Allergies as of 12/21/2019      Reactions   Benicar Hct [olmesartan Medoxomil-hctz] Shortness Of Breath   Butrans [buprenorphine] Diarrhea, Nausea And Vomiting   Nadolol Shortness Of Breath   Morphine And Related Hives, Itching   Prednisone Other (See Comments)   Chest and throat pain  Medication List    STOP taking these medications   buprenorphine 2 MG Subl SL  tablet Commonly known as: SUBUTEX   buprenorphine-naloxone 8-2 mg Subl SL tablet Commonly known as: SUBOXONE     TAKE these medications   ALPRAZolam 1 MG tablet Commonly known as: XANAX Take 1 tablet (1 mg total) by mouth 3 (three) times daily as needed for anxiety. What changed:   medication strength  how much to take  when to take this  reasons to take this  additional instructions   aspirin 81 MG chewable tablet Chew 1 tablet (81 mg total) by mouth daily. Start taking on: December 22, 2019   carvedilol 3.125 MG tablet Commonly known as: COREG Take 1 tablet (3.125 mg total) by mouth 2 (two) times daily with a meal.   furosemide 20 MG tablet Commonly known as: LASIX Take 1 tablet (20 mg total) by mouth daily. Start taking on: December 22, 2019   losartan 25 MG tablet Commonly known as: COZAAR Take 0.5 tablets (12.5 mg total) by mouth daily. Start taking on: December 22, 2019   nicotine 21 mg/24hr patch Commonly known as: NICODERM CQ - dosed in mg/24 hours Place 1 patch (21 mg total) onto the skin daily. Start taking on: December 22, 2019   pantoprazole 40 MG tablet Commonly known as: PROTONIX Take 1 tablet (40 mg total) by mouth daily at 6 (six) AM. Start taking on: December 22, 2019   rosuvastatin 40 MG tablet Commonly known as: CRESTOR Take 1 tablet (40 mg total) by mouth daily at 6 PM.   tamsulosin 0.4 MG Caps capsule Commonly known as: FLOMAX Take 0.4 mg by mouth daily.   traZODone 100 MG tablet Commonly known as: DESYREL Take 1 tablet (100 mg total) by mouth at bedtime.      Follow-up Information    Cole Hong, MD. Schedule an appointment as soon as possible for a visit in 1 week(s).   Specialty: Internal Medicine Contact information: [address] [place] [address] [phone]        Parke Poisson, MD Follow up.   Specialties: Cardiology, Radiology Why: Hospital follow-up scheduled for 12/31/2019 at 10:00am. Please arrive 15  minutes early for check-in. If this date/time does not work for you, please call our office to reschedule.  Contact information: [address] [place] [address] [phone]          Allergies  Allergen Reactions  . Benicar Hct [Olmesartan Medoxomil-Hctz] Shortness Of Breath  . Butrans [Buprenorphine] Diarrhea and Nausea And Vomiting  . Nadolol Shortness Of Breath  . Morphine And Related Hives and Itching  . Prednisone Other (See Comments)    Chest and throat pain    Consultations: Cardiology Psychiatry.   Procedures/Studies: CT ABDOMEN PELVIS W CONTRAST  Result Date: 12/17/2019 CLINICAL DATA:  Nausea, vomiting, abdominal pain. EXAM: CT ABDOMEN AND PELVIS WITH CONTRAST TECHNIQUE: Multidetector CT imaging of the abdomen and pelvis was performed using the standard protocol following bolus administration of intravenous contrast. CONTRAST:  OMNIPAQUE IOHEXOL 300 MG/ML  SOLN COMPARISON:  None. FINDINGS: Lower chest: Mild right pleural effusion is noted with adjacent subsegmental atelectasis. Small pericardial effusion is noted. Hepatobiliary: Hepatic steatosis is noted. No gallstones, gallbladder wall thickening, or biliary dilatation. Pancreas: Unremarkable. No pancreatic ductal dilatation or surrounding inflammatory changes. Spleen: Normal in size without focal abnormality. Adrenals/Urinary Tract: Adrenal glands are unremarkable. Kidneys are normal, without renal calculi, focal lesion, or hydronephrosis. Bladder is unremarkable.  Stomach/Bowel: Stomach is within normal limits. Appendix appears normal. No evidence of bowel wall thickening, distention, or inflammatory changes. Vascular/Lymphatic: 4 cm infrarenal abdominal aortic aneurysm is noted. Atherosclerosis of abdominal aorta is noted. No adenopathy is noted. Reproductive: Prostate is unremarkable. Other: No abdominal wall hernia or abnormality. No abdominopelvic ascites. Musculoskeletal: No acute or significant  osseous findings. IMPRESSION: 1. Mild right pleural effusion is noted with adjacent subsegmental atelectasis. 2. Small pericardial effusion. 3. Hepatic steatosis. 4. 4 cm infrarenal abdominal aortic aneurysm is noted. Recommend followup by [place] in 1 year. This recommendation follows ACR consensus guidelines: White Paper of the ACR Incidental Findings Committee II on Vascular Findings. J Am Coll Radiol 2013; 10:789-794. Aortic Atherosclerosis (ICD10-I70.0). Electronically Signed   By: Lupita Raider M.D.   On: 12/17/2019 14:08   CARDIAC CATHETERIZATION  Result Date: 12/20/2019  Prox LAD to Mid LAD lesion is 40% stenosed.  Ost Cx to Mid Cx lesion is 100% stenosed.  Prox RCA lesion is 90% stenosed.  LV end diastolic pressure is mildly elevated.  Hemodynamic findings consistent with mild pulmonary hypertension.  1. 2 vessel obstructive CAD    - 100% proximal LCX at site of prior stent. This was a large vessel. No collaterals.    - 90% small co dominant RCA 2. Mildly elevated LV filling pressures 3. Mild pulmonary HTN 4. Preserved cardiac output. Plan: recommend medical management. The LCx is not salvageable. The RCA is quite small- only 2-2.25 mm diameter.   US AORTA  Result Date: 12/16/2019 CLINICAL DATA:  History of abdominal aortic aneurysm. EXAM: ULTRASOUND OF ABDOMINAL AORTA TECHNIQUE: Ultrasound examination of the abdominal aorta and proximal common iliac arteries was performed to evaluate for aneurysm. Additional color and Doppler images of the distal aorta were obtained to document patency. COMPARISON:  None. FINDINGS: Abdominal aortic measurements as follows: Proximal:  2.3 x 3.0 cm Mid:  3.8 x 4.9 cm Distal:  1.7 x 2.4 cm Patent: Yes, peak systolic velocity is 34 cm/s Right common iliac artery: 1 cm Left common iliac artery: 1 cm IMPRESSION: Aortic atherosclerosis with fusiform aortic aneurysm measuring 4.9 x 3.8 cm in maximum orthogonal diameters and extending about 5.6 cm craniocaudal distance.  Electronically Signed   By: Kennith Center M.D.   On: 12/16/2019 18:50   DG CHEST PORT 1 VIEW  Result Date: 12/19/2019 CLINICAL DATA:  69 year old male with pleural effusion and infiltrate. Follow-up chest radiograph. EXAM: PORTABLE CHEST 1 VIEW COMPARISON:  Chest radiograph dated 12/16/2019 FINDINGS: There is moderate to severe cardiomegaly. Small right pleural effusion and right lung base atelectasis or infiltrate similar or slightly improved since the prior radiograph. No pneumothorax. Atherosclerotic calcification of the aorta. No acute osseous pathology. IMPRESSION: Similar or slightly improved small right pleural effusion and right lung base atelectasis or infiltrate. Electronically Signed   By: Elgie Collard M.D.   On: 12/19/2019 17:47   DG Chest Portable 1 View  Result Date: 12/16/2019 CLINICAL DATA:  Increasing confusion and hypoxia EXAM: PORTABLE CHEST 1 VIEW COMPARISON:  08/27/2016 FINDINGS: Cardiac shadow is enlarged but stable. Aortic calcifications are again seen and stable. Increasing right basilar infiltrate with associated small effusion is seen. Left lung is clear. No bony abnormality is noted. IMPRESSION: Right basilar infiltrate and associated effusion consistent with acute pneumonia. Electronically Signed   By: Alcide Clever M.D.   On: 12/16/2019 11:14   ECHOCARDIOGRAM COMPLETE  Result Date: 12/17/2019    ECHOCARDIOGRAM REPORT   Patient Name:   SHRAY HUNLEY [facility]  Date of Exam: 12/17/2019 Medical Rec #:  [id number]       Height:       72.0 in Accession #:    [id number]      Weight:       160.0 lb Date of Birth:  04/13/1951       BSA:          1.938 m Patient Age:    69 years        BP:           127/92 mmHg Patient Gender: M               HR:           102 bpm. Exam Location:  Inpatient Procedure: 2D Echo, 3D Echo, Color Doppler and Cardiac Doppler Indications:    I50.31 Acute diastolic (congestive) heart failure  History:        Patient has no prior history of Echocardiogram examinations.                  CHF, CAD, PAD; Risk Factors:Hypertension.  Sonographer:    Irving Burton Senior RDCS Referring Phys: [phone] RONDELL A SMITH IMPRESSIONS  1. Left ventricular ejection fraction, by estimation, is 15-20%. The left ventricle has severely decreased function. The left ventricle demonstrates global hypokinesis. The left ventricular internal cavity size was severely dilated. Indeterminate diastolic filling due to E-A fusion.  2. Right ventricular systolic function is low normal. The right ventricular size is mildly enlarged. There is moderately elevated pulmonary artery systolic pressure. The estimated right ventricular systolic pressure is 45.2 mmHg.  3. Left atrial size was severely dilated.  4. Right atrial size was mild to moderately dilated.  5. The mitral valve is normal in structure. Moderate mitral valve regurgitation.  6. The aortic valve is normal in structure. Aortic valve regurgitation is not visualized.  7. The inferior vena cava is dilated in size with >50% respiratory variability, suggesting right atrial pressure of 8 mmHg. FINDINGS  Left Ventricle: Left ventricular ejection fraction, by estimation, is 15-20%. The left ventricle has severely decreased function. The left ventricle demonstrates global hypokinesis. Mild dyskinesis of the left ventricular, entire apical segment. The left ventricular internal cavity size was severely dilated. There is no left ventricular hypertrophy. Indeterminate diastolic filling due to E-A fusion. Right Ventricle: The right ventricular size is mildly enlarged. No increase in right ventricular wall thickness. Right ventricular systolic function is low normal. There is moderately elevated pulmonary artery systolic pressure. The tricuspid regurgitant  velocity is 3.05 m/s, and with an assumed right atrial pressure of 8 mmHg, the estimated right ventricular systolic pressure is 45.2 mmHg. Left Atrium: Left atrial size was severely dilated. Right Atrium: Right atrial size was  mild to moderately dilated. Pericardium: A small pericardial effusion is present. The pericardial effusion is circumferential. Mitral Valve: The mitral valve is normal in structure. Moderate mitral valve regurgitation, with centrally-directed jet. Tricuspid Valve: The tricuspid valve is normal in structure. Tricuspid valve regurgitation is mild. Aortic Valve: The aortic valve is normal in structure. Aortic valve regurgitation is not visualized. Pulmonic Valve: The pulmonic valve was grossly normal. Pulmonic valve regurgitation is not visualized. Aorta: The aortic root and ascending aorta are structurally normal, with no evidence of dilitation. Venous: The inferior vena cava is dilated in size with greater than 50% respiratory variability, suggesting right atrial pressure of 8 mmHg. IAS/Shunts: No atrial level shunt detected by color flow Doppler.  LEFT VENTRICLE PLAX 2D LVIDd:  7.50 cm LVIDs:         6.90 cm LV PW:         0.90 cm LV IVS:        0.80 cm LVOT diam:     2.30 cm LV SV:         32 LV SV Index:   16 LVOT Area:     4.15 cm  LV Volumes (MOD) LV vol d, MOD A2C: 333.0 ml LV vol d, MOD A4C: 337.0 ml LV vol s, MOD A2C: 315.0 ml LV vol s, MOD A4C: 254.0 ml LV SV MOD A2C:     18.0 ml LV SV MOD A4C:     337.0 ml LV SV MOD BP:      49.0 ml RIGHT VENTRICLE RV S prime:     10.20 cm/s TAPSE (M-mode): 1.3 cm LEFT ATRIUM              Index       RIGHT ATRIUM           Index LA diam:        3.90 cm  2.01 cm/m  RA Area:     27.20 cm LA Vol (A2C):   126.0 ml 65.02 ml/m RA Volume:   92.60 ml  47.79 ml/m LA Vol (A4C):   94.9 ml  48.97 ml/m LA Biplane Vol: 111.0 ml 57.28 ml/m  AORTIC VALVE LVOT Vmax:   72.50 cm/s LVOT Vmean:  51.300 cm/s LVOT VTI:    0.076 m  AORTA Ao Root diam: 3.70 cm Ao Asc diam:  3.00 cm MITRAL VALVE                 TRICUSPID VALVE MV Area (PHT): 5.97 cm      TR Peak grad:   37.2 mmHg MV Decel Time: 127 msec      TR Vmax:        305.00 cm/s MR Peak grad:    72.6 mmHg MR Mean grad:    44.0  mmHg   SHUNTS MR Vmax:         426.00 cm/s Systemic VTI:  0.08 m MR Vmean:        313.0 cm/s  Systemic Diam: 2.30 cm MR PISA:         1.01 cm MR PISA Eff ROA: 9 mm MR PISA Radius:  0.40 cm MV E velocity: 109.00 cm/s Dani Gobble Croitoru MD Electronically signed by Sanda Klein MD Signature Date/Time: 12/17/2019/1:42:15 PM    Final    VAS [place] LOWER EXTREMITY VENOUS (DVT) (MC and WL 7a-7p)  Result Date: 12/16/2019  Lower Venous DVTStudy Indications: Swelling, and SOB.  Comparison Study: no prior Performing Technologist: June Leap RDMS, RVT  Examination Guidelines: A complete evaluation includes B-mode imaging, spectral Doppler, color Doppler, and power Doppler as needed of all accessible portions of each vessel. Bilateral testing is considered an integral part of a complete examination. Limited examinations for reoccurring indications may be performed as noted. The reflux portion of the exam is performed with the patient in reverse Trendelenburg.  +---------+---------------+---------+-----------+----------+--------------+ RIGHT    CompressibilityPhasicitySpontaneityPropertiesThrombus Aging +---------+---------------+---------+-----------+----------+--------------+ CFV      Full           Yes      Yes                                 +---------+---------------+---------+-----------+----------+--------------+ SFJ      Full                                                        +---------+---------------+---------+-----------+----------+--------------+  FV Prox  Full                                                        +---------+---------------+---------+-----------+----------+--------------+ FV Mid   Full                                                        +---------+---------------+---------+-----------+----------+--------------+ FV DistalFull                                                        +---------+---------------+---------+-----------+----------+--------------+  PFV      Full                                                        +---------+---------------+---------+-----------+----------+--------------+ POP      Full           Yes      Yes                                 +---------+---------------+---------+-----------+----------+--------------+ PTV      Full                                                        +---------+---------------+---------+-----------+----------+--------------+ PERO     Full                                                        +---------+---------------+---------+-----------+----------+--------------+   +---------+---------------+---------+-----------+----------+--------------+ LEFT     CompressibilityPhasicitySpontaneityPropertiesThrombus Aging +---------+---------------+---------+-----------+----------+--------------+ CFV      Full           Yes      Yes                                 +---------+---------------+---------+-----------+----------+--------------+ SFJ      Full                                                        +---------+---------------+---------+-----------+----------+--------------+ FV Prox  Full                                                        +---------+---------------+---------+-----------+----------+--------------+  FV Mid   Full                                                        +---------+---------------+---------+-----------+----------+--------------+ FV DistalFull                                                        +---------+---------------+---------+-----------+----------+--------------+ PFV      Full                                                        +---------+---------------+---------+-----------+----------+--------------+ POP      Full           Yes      Yes                                 +---------+---------------+---------+-----------+----------+--------------+ PTV      Full                                                         +---------+---------------+---------+-----------+----------+--------------+ PERO     Full                                                        +---------+---------------+---------+-----------+----------+--------------+     Summary: RIGHT: - There is no evidence of deep vein thrombosis in the lower extremity.  - No cystic structure found in the popliteal fossa. - Pulsatile venous flow suggests increased right sided heart pressure.  LEFT: - There is no evidence of deep vein thrombosis in the lower extremity.  - A cystic structure is found in the popliteal fossa. - Pulsatile venous flow suggests increased right sided heart pressure.  *See table(s) above for measurements and observations. Electronically signed by Gretta Began MD on 12/16/2019 at 3:13:21 PM.    Final     Cardiac catheterization :  Prox LAD to Mid LAD lesion is 40% stenosed.  Ost Cx to Mid Cx lesion is 100% stenosed.  Prox RCA lesion is 90% stenosed.  LV end diastolic pressure is mildly elevated.  Hemodynamic findings consistent with mild pulmonary hypertension.  1. 2 vessel obstructive CAD - 100% proximal LCX at site of prior stent. This was a large vessel. No collaterals. - 90% small co dominant RCA 2. Mildly elevated LV filling pressures 3. Mild pulmonary HTN 4. Preserved cardiac output.    Subjective: No new complaints.   Discharge Exam: Vitals:   12/21/19 0406 12/21/19 0806  BP: 107/77 118/80  Pulse:  87  Resp: 18 17  Temp: 98 F (36.7 C) 97.9 F (36.6 C)  SpO2: 91% 93%  Vitals:   12/20/19 1435 12/20/19 2100 12/21/19 0406 12/21/19 0806  BP: 93/71 96/72 107/77 118/80  Pulse: 88 83  87  Resp:  18 18 17   Temp:  98.3 F (36.8 C) 98 F (36.7 C) 97.9 F (36.6 C)  TempSrc:  Oral Oral Oral  SpO2: 96% 96% 91% 93%  Weight:   61.5 kg   Height:        General: Pt is alert, awake, not in acute distress Cardiovascular: RRR, S1/S2 +, no rubs, no gallops Respiratory: CTA  bilaterally, no wheezing, no rhonchi Abdominal: Soft, NT, ND, bowel sounds + Extremities: no edema, no cyanosis    The results of significant diagnostics from this hospitalization (including imaging, microbiology, ancillary and laboratory) are listed below for reference.     Microbiology: Recent Results (from the past 240 hour(s))  SARS CORONAVIRUS 2 (TAT 6-24 HRS) Nasopharyngeal Nasopharyngeal Swab     Status: None   Collection Time: 12/16/19 12:07 PM   Specimen: Nasopharyngeal Swab  Result Value Ref Range Status   SARS Coronavirus 2 NEGATIVE NEGATIVE Final    Comment: (NOTE) SARS-CoV-2 target nucleic acids are NOT DETECTED. The SARS-CoV-2 RNA is generally detectable in upper and lower respiratory specimens during the acute phase of infection. Negative results do not preclude SARS-CoV-2 infection, do not rule out co-infections with other pathogens, and should not be used as the sole basis for treatment or other patient management decisions. Negative results must be combined with clinical observations, patient history, and epidemiological information. The expected result is Negative. Fact Sheet for Patients: 02/15/20 Fact Sheet for Healthcare Providers: HairSlick.no This test is not yet approved or cleared by the quierodirigir.com FDA and  has been authorized for detection and/or diagnosis of SARS-CoV-2 by FDA under an Emergency Use Authorization (EUA). This EUA will remain  in effect (meaning this test can be used) for the duration of the COVID-19 declaration under Section 56 4(b)(1) of the Act, 21 U.S.C. section 360bbb-3(b)(1), unless the authorization is terminated or revoked sooner. Performed at [facility], [address] [place], [place]      Labs: BNP (last 3 results) Recent Labs    12/16/19 1041  BNP >4,500.0*   Basic Metabolic Panel: Recent Labs  Lab 12/17/19 0400 12/17/19 1103  12/18/19 0331 12/18/19 0331 12/19/19 0222 12/20/19 0259 12/20/19 1214 12/20/19 1217 12/21/19 0328  NA 141  --  139   < > 140 137 138 139 136  K 5.2*   < > 3.5   < > 3.1* 4.4 4.3 4.3 4.0  CL 101  --  95*  --  95* 95*  --   --  99  CO2 27  --  30  --  32 33*  --   --  28  GLUCOSE 97  --  97  --  90 105*  --   --  104*  BUN 12  --  12  --  12 16  --   --  11  CREATININE 0.81  --  0.84  --  0.82 0.87  --   --  0.72  CALCIUM 8.9  --  9.2  --  9.2 8.8*  --   --  8.9  MG  --   --  1.6*  --  1.7 2.0  --   --   --    < > = values in this interval not displayed.   Liver Function Tests: Recent Labs  Lab 12/16/19 1041  AST 33  ALT 49*  ALKPHOS 52  BILITOT 1.2  PROT 6.5  ALBUMIN 3.6   Recent Labs  Lab 12/16/19 1041  LIPASE 16   Recent Labs  Lab 12/16/19 1041  AMMONIA 31   CBC: Recent Labs  Lab 12/16/19 1041 12/19/19 0222 12/20/19 0259 12/20/19 1214 12/20/19 1217  WBC 7.3 7.0 7.1  --   --   NEUTROABS 5.1  --  4.7  --   --   HGB 14.8 15.1 14.4 15.0 15.3  HCT 46.2 46.3 44.9 44.0 45.0  MCV 107.7* 103.6* 107.2*  --   --   PLT 211 173 186  --   --    Cardiac Enzymes: No results for input(s): CKTOTAL, CKMB, CKMBINDEX, TROPONINI in the last 168 hours. BNP: Invalid input(s): POCBNP CBG: Recent Labs  Lab 12/16/19 1041  GLUCAP 112*   D-Dimer No results for input(s): DDIMER in the last 72 hours. Hgb A1c No results for input(s): HGBA1C in the last 72 hours. Lipid Profile No results for input(s): CHOL, HDL, LDLCALC, TRIG, CHOLHDL, LDLDIRECT in the last 72 hours. Thyroid function studies No results for input(s): TSH, T4TOTAL, T3FREE, THYROIDAB in the last 72 hours.  Invalid input(s): FREET3 Anemia work up No results for input(s): VITAMINB12, FOLATE, FERRITIN, TIBC, IRON, RETICCTPCT in the last 72 hours. Urinalysis    Component Value Date/Time   COLORURINE AMBER (A) 12/16/2019 1230   APPEARANCEUR CLEAR 12/16/2019 1230   LABSPEC 1.024 12/16/2019 1230   PHURINE 5.0  12/16/2019 1230   GLUCOSEU NEGATIVE 12/16/2019 1230   HGBUR NEGATIVE 12/16/2019 1230   BILIRUBINUR NEGATIVE 12/16/2019 1230   KETONESUR NEGATIVE 12/16/2019 1230   PROTEINUR 30 (A) 12/16/2019 1230   NITRITE NEGATIVE 12/16/2019 1230   LEUKOCYTESUR NEGATIVE 12/16/2019 1230   Sepsis Labs Invalid input(s): PROCALCITONIN,  WBC,  LACTICIDVEN Microbiology Recent Results (from the past 240 hour(s))  SARS CORONAVIRUS 2 (TAT 6-24 HRS) Nasopharyngeal Nasopharyngeal Swab     Status: None   Collection Time: 12/16/19 12:07 PM   Specimen: Nasopharyngeal Swab  Result Value Ref Range Status   SARS Coronavirus 2 NEGATIVE NEGATIVE Final    Comment: (NOTE) SARS-CoV-2 target nucleic acids are NOT DETECTED. The SARS-CoV-2 RNA is generally detectable in upper and lower respiratory specimens during the acute phase of infection. Negative results do not preclude SARS-CoV-2 infection, do not rule out co-infections with other pathogens, and should not be used as the sole basis for treatment or other patient management decisions. Negative results must be combined with clinical observations, patient history, and epidemiological information. The expected result is Negative. Fact Sheet for Patients: HairSlick.no Fact Sheet for Healthcare Providers: quierodirigir.com This test is not yet approved or cleared by the [place] FDA and  has been authorized for detection and/or diagnosis of SARS-CoV-2 by FDA under an Emergency Use Authorization (EUA). This EUA will remain  in effect (meaning this test can be used) for the duration of the COVID-19 declaration under Section 56 4(b)(1) of the Act, 21 U.S.C. section 360bbb-3(b)(1), unless the authorization is terminated or revoked sooner. Performed at [facility], [address] [place], [place] [address]      Time coordinating discharge: 34 minutes  SIGNED:   Kathlen Mody, MD  [facility] 12/21/2019, 1:03 PM

## 2019-12-21 NOTE — TOC Initial Note (Signed)
Transition of Care [facility]) - Initial/Assessment Note    Patient Details  Name: Cole Fuller MRN: [id number] Date of Birth: Jul 10, 1951  [facility]) CM/SW Contact:    Lawerance Sabal, RN Phone Number: 12/21/2019, 10:52 AM  Clinical Narrative:              Spoke w paytient at bedside.  He states that he lives at home with his wife. He has a RW at home. He declines HH services at this time. We reviewed that if he wants them in the future, he will call his PCP for referral. He verbalized understanding and confirmed he has PCP.       Expected Discharge Plan: Home/Self Care Barriers to Discharge: Continued Medical Work up   Patient Goals and CMS Choice        Expected Discharge Plan and Services Expected Discharge Plan: Home/Self Care                                              Prior Living Arrangements/Services                       Activities of Daily Living Home Assistive Devices/Equipment: Blood pressure cuff ADL Screening (condition at time of admission) Patient's cognitive ability adequate to safely complete daily activities?: Yes Is the patient deaf or have difficulty hearing?: Yes(hard of hearing no aids) Does the patient have difficulty seeing, even when wearing glasses/contacts?: No Does the patient have difficulty concentrating, remembering, or making decisions?: Yes(forgetful) Patient able to express need for assistance with ADLs?: Yes Does the patient have difficulty dressing or bathing?: No Independently performs ADLs?: Yes (appropriate for developmental age) Does the patient have difficulty walking or climbing stairs?: No Weakness of Legs: None Weakness of Arms/Hands: None  Permission Sought/Granted                  Emotional Assessment              Admission diagnosis:  AAA (abdominal aortic aneurysm) (HCC) [I71.4] Acute exacerbation of CHF (congestive heart failure) (HCC) [I50.9] Acute respiratory failure with  hypoxia (HCC) [J96.01] Community acquired pneumonia, unspecified laterality [J18.9] Congestive heart failure, unspecified HF chronicity, unspecified heart failure type (HCC) [I50.9] Patient Active Problem List   Diagnosis Date Noted  . Adjustment disorder with depressed mood 12/18/2019  . Acute exacerbation of CHF (congestive heart failure) (HCC) 12/16/2019  . Chronic pain syndrome 12/16/2019  . Prolonged QT interval 12/16/2019  . Acute metabolic encephalopathy 12/16/2019  . Baker cyst 12/16/2019  . NSTEMI (non-ST elevated myocardial infarction) (HCC) 03/16/2016  . PAD (peripheral artery disease) (HCC) 03/16/2016  . AAA (abdominal aortic aneurysm) (HCC) 03/16/2016  . HTN (hypertension) 03/16/2016  . Tobacco abuse 03/16/2016  . Postop check 02/18/2012  . Right inguinal hernia 10/01/2011   PCP:  Eber Hong, MD Pharmacy:   [facility] [phone] - [place], [place] - [address] AT [facility] OF [address] [place] [address] Phone: [phone] Fax: [phone]  [id number] - [place], [place] - [address] AT [facility] [address] [place] [address] Phone: [phone] Fax: [phone]     Social Determinants of Health (SDOH) Interventions    Readmission Risk Interventions No flowsheet data found.

## 2019-12-21 NOTE — Progress Notes (Signed)
Progress Note  Patient Name: Cole Fuller Date of Encounter: 12/21/2019  Primary Cardiologist: Dr Dollene Cleveland  Subjective   Denies dyspnea or CP.  Inpatient Medications    Scheduled Meds: . aspirin  81 mg Oral Daily  . carvedilol  3.125 mg Oral BID WC  . doxycycline  100 mg Oral Q12H  . enoxaparin (LOVENOX) injection  40 mg Subcutaneous Q24H  . losartan  12.5 mg Oral Daily  . nicotine  21 mg Transdermal Daily  . pantoprazole  40 mg Oral Q0600  . rosuvastatin  40 mg Oral q1800  . sodium chloride flush  3 mL Intravenous Q12H  . sodium chloride flush  3 mL Intravenous Q12H  . sodium chloride flush  3 mL Intravenous Q12H  . sucralfate  1 g Oral TID WC & HS  . traZODone  100 mg Oral QHS   Continuous Infusions: . sodium chloride    . sodium chloride     PRN Meds: sodium chloride, sodium chloride, acetaminophen, alprazolam, oxyCODONE-acetaminophen, sodium chloride flush, sodium chloride flush   Vital Signs    Vitals:   12/20/19 1435 12/20/19 2100 12/21/19 0406 12/21/19 0806  BP: 93/71 96/72 107/77 118/80  Pulse: 88 83  87  Resp:  18 18   Temp:  98.3 F (36.8 C) 98 F (36.7 C)   TempSrc:  Oral Oral   SpO2: 96% 96% 91%   Weight:   61.5 kg   Height:        Intake/Output Summary (Last 24 hours) at 12/21/2019 0819 Last data filed at 12/20/2019 2145 Gross per 24 hour  Intake 240 ml  Output 675 ml  Net -435 ml   Last 3 Weights 12/21/2019 12/20/2019 12/19/2019  Weight (lbs) 135 lb 8 oz 151 lb 11.2 oz 147 lb 4.8 oz  Weight (kg) 61.462 kg 68.811 kg 66.815 kg      Telemetry    Sinus- Personally Reviewed  Physical Exam   GEN: WD WN Neck: supple Cardiac: RRR Respiratory: CTA GI: Soft, NT/ND MS: No edema,  radial cath site with no hematoma.  ENeuro:  Grossly intact Psych: Normal affect   Labs    High Sensitivity Troponin:   Recent Labs  Lab 12/16/19 1041 12/16/19 1401  TROPONINIHS 81* 80*      Chemistry Recent Labs  Lab 12/16/19 1041 12/17/19 0400  12/19/19 0222 12/19/19 0222 12/20/19 0259 12/20/19 0259 12/20/19 1214 12/20/19 1217 12/21/19 0328  NA 139   < > 140   < > 137   < > 138 139 136  K 4.3   < > 3.1*   < > 4.4   < > 4.3 4.3 4.0  CL 102   < > 95*  --  95*  --   --   --  99  CO2 28   < > 32  --  33*  --   --   --  28  GLUCOSE 122*   < > 90  --  105*  --   --   --  104*  BUN 11   < > 12  --  16  --   --   --  11  CREATININE 0.91   < > 0.82  --  0.87  --   --   --  0.72  CALCIUM 9.3   < > 9.2  --  8.8*  --   --   --  8.9  PROT 6.5  --   --   --   --   --   --   --   --  ALBUMIN 3.6  --   --   --   --   --   --   --   --   AST 33  --   --   --   --   --   --   --   --   ALT 49*  --   --   --   --   --   --   --   --   ALKPHOS 52  --   --   --   --   --   --   --   --   BILITOT 1.2  --   --   --   --   --   --   --   --   GFRNONAA >60   < > >60  --  >60  --   --   --  >60  GFRAA >60   < > >60  --  >60  --   --   --  >60  ANIONGAP 9   < > 13  --  9  --   --   --  9   < > = values in this interval not displayed.     Hematology Recent Labs  Lab 12/16/19 1041 12/16/19 1041 12/19/19 0222 12/19/19 0222 12/20/19 0259 12/20/19 1214 12/20/19 1217  WBC 7.3  --  7.0  --  7.1  --   --   RBC 4.29  --  4.47  --  4.19*  --   --   HGB 14.8   < > 15.1   < > 14.4 15.0 15.3  HCT 46.2   < > 46.3   < > 44.9 44.0 45.0  MCV 107.7*  --  103.6*  --  107.2*  --   --   MCH 34.5*  --  33.8  --  34.4*  --   --   MCHC 32.0  --  32.6  --  32.1  --   --   RDW 14.2  --  13.9  --  14.0  --   --   PLT 211  --  173  --  186  --   --    < > = values in this interval not displayed.    BNP Recent Labs  Lab 12/16/19 1041  BNP >4,500.0*    Radiology    CARDIAC CATHETERIZATION  Result Date: 12/20/2019  Prox LAD to Mid LAD lesion is 40% stenosed.  Ost Cx to Mid Cx lesion is 100% stenosed.  Prox RCA lesion is 90% stenosed.  LV end diastolic pressure is mildly elevated.  Hemodynamic findings consistent with mild pulmonary hypertension.   1. 2 vessel obstructive CAD    - 100% proximal LCX at site of prior stent. This was a large vessel. No collaterals.    - 90% small co dominant RCA 2. Mildly elevated LV filling pressures 3. Mild pulmonary HTN 4. Preserved cardiac output. Plan: recommend medical management. The LCx is not salvageable. The RCA is quite small- only 2-2.25 mm diameter.   DG CHEST PORT 1 VIEW  Result Date: 12/19/2019 CLINICAL DATA:  69 year old male with pleural effusion and infiltrate. Follow-up chest radiograph. EXAM: PORTABLE CHEST 1 VIEW COMPARISON:  Chest radiograph dated 12/16/2019 FINDINGS: There is moderate to severe cardiomegaly. Small right pleural effusion and right lung base atelectasis or infiltrate similar or slightly improved since the prior radiograph. No pneumothorax. Atherosclerotic calcification of the aorta. No acute  osseous pathology. IMPRESSION: Similar or slightly improved small right pleural effusion and right lung base atelectasis or infiltrate. Electronically Signed   By: Anner Crete M.D.   On: 12/19/2019 17:47    Patient Profile     69 y.o. male with past medical history of coronary artery disease, peripheral vascular disease, hypertension, abdominal aortic aneurysm, chronic pain syndrome, tobacco abuse admitted with altered mental status felt secondary to narcotic overuse found to have cardiomyopathy/CHF and cardiology asked to evaluate.  Echocardiogram shows ejection fraction 15 to 20%, biatrial enlargement, moderate mitral regurgitation.  Assessment & Plan    1 cardiomyopathy-etiology unclear.  Cardiac catheterization does reveal coronary disease but cardiomyopathy out of proportion.  Plan medical therapy.  We will continue low-dose carvedilol and losartan.  I cannot advance at this point as his blood pressure is borderline.  I do not think his blood pressure will tolerate Entresto at present.  We will try and titrate medications following discharge.  Recheck echocardiogram in 3 months.  If  ejection fraction less than 35% would need to consider ICD but he would need to demonstrate compliance with medications and follow-up first.    2 acute systolic congestive heart failure-pulmonary capillary wedge pressure mildly elevated.  We will add Lasix 20 mg daily.  Check potassium and renal function 1 week following discharge.  Will consider addition of spironolactone as an outpatient if blood pressure allows.  3 history of coronary artery disease-plan is medical therapy.  Continue aspirin and statin.  4 pneumonia-antibiotics per primary care.  5 abdominal aortic aneurysm-patient will need follow-up abdominal ultrasound April 2022.  6 chronic pain-Per primary care.  Patient can be discharged from a cardiac standpoint.  Continue present medications as listed in MAR.  Check potassium and renal function 1 week following discharge.  Follow-up Dr. Margaretann Loveless in 1 to 2 weeks.  Please call with questions.  For questions or updates, please contact [place] Please consult [email or web address] for contact info under        Signed, Kirk Ruths, MD  12/21/2019, 8:19 AM

## 2019-12-21 NOTE — Discharge Instructions (Signed)
Carvedilol Oral Capsule, Extended Release What is this medicine? CARVEDILOL (KAR ve dil ol) is a beta blocker. It decreases the amount of work your heart has to do and helps your heart beat regularly. It treats high blood pressure. It is also used after a heart attack to prevent a second one. This medicine may be used for other purposes; ask your health care provider or pharmacist if you have questions. COMMON BRAND NAME(S): Coreg CR What should I tell my health care provider before I take this medicine? They need to know if you have any of these conditions:  circulation problems  diabetes  history of heart attack or heart disease  liver disease  lung or breathing disease, like asthma or emphysema  pheochromocytoma  slow or irregular heartbeat  thyroid disease  an unusual or allergic reaction to carvedilol, other beta-blockers, medicines, foods, dyes, or preservatives  pregnant or trying to get pregnant  breast-feeding How should I use this medicine? Take this drug by mouth. Take it as directed on the prescription label at the same time every day. Do not cut, crush or chew this drug. Swallow the capsules whole. You may open the capsule and put the contents in 1 teaspoon of applesauce. Swallow the drug and applesauce right away. Do not chew the drug or applesauce. Take it with food at the start of a meal or snack. Keep taking it unless your health care provider tells you to stop. Talk to your health care provider about the use of this drug in children. Special care may be needed. Overdosage: If you think you have taken too much of this medicine contact a poison control center or emergency room at once. NOTE: This medicine is only for you. Do not share this medicine with others. What if I miss a dose? If you miss a dose, take it as soon as you can. If it is almost time for your next dose, take only that dose. Do not take double or extra doses. What may interact with this  medicine? This medicine may interact with the following medications:  certain medicines for blood pressure, heart disease, irregular heart beat  certain medicines for depression, like fluoxetine or paroxetine  certain medicines for diabetes, like glipizide or glyburide  cimetidine  clonidine  cyclosporine  digoxin  MAOIs like Carbex, Eldepryl, Marplan, Nardil, and Parnate  reserpine  rifampin This list may not describe all possible interactions. Give your health care provider a list of all the medicines, herbs, non-prescription drugs, or dietary supplements you use. Also tell them if you smoke, drink alcohol, or use illegal drugs. Some items may interact with your medicine. What should I watch for while using this medicine? Check your heart rate and blood pressure regularly while you are taking this medicine. Ask your doctor or health care professional what your heart rate and blood pressure should be, and when you should contact him or her. Do not stop taking this medicine suddenly. This could lead to serious heart-related effects. Contact your doctor or health care professional if you have difficulty breathing while taking this drug. Check your weight daily. Ask your doctor or health care professional when you should notify him/her of any weight gain. You may get drowsy or dizzy. Do not drive, use machinery, or do anything that requires mental alertness until you know how this medicine affects you. To reduce the risk of dizzy or fainting spells, do not sit or stand up quickly. Alcohol can make you more drowsy, and increase  flushing and rapid heartbeats. Avoid alcoholic drinks. This medicine may increase blood sugar. Ask your healthcare provider if changes in diet or medicines are needed if you have diabetes. If you are going to have surgery, tell your doctor or health care professional that you are taking this medicine. What side effects may I notice from receiving this  medicine? Side effects that you should report to your doctor or health care professional as soon as possible:  allergic reactions like skin rash, itching or hives, swelling of the face, lips, or tongue  breathing problems  dark urine   signs and symptoms of high blood sugar such as being more thirsty or hungry or having to urinate more than normal. You may also feel very tired or have blurry vision.  slow or irregular heartbeat  swollen legs or ankles  vomiting  yellowing of the eyes or skin Side effects that usually do not require medical attention (report to your doctor or health care professional if they continue or are bothersome):  change in sex drive or performance  diarrhea  dry eyes (especially if wearing contact lenses)  dry, itching skin  headache  nausea  unusually tired This list may not describe all possible side effects. Call your doctor for medical advice about side effects. You may report side effects to FDA at 1-800-FDA-1088. Where should I keep my medicine? Keep out of the reach of children and pets. Store at room temperature between 15 and 30 degrees C (59 and 86 degrees F). Throw away any unused drug after the expiration date. NOTE: This sheet is a summary. It may not cover all possible information. If you have questions about this medicine, talk to your doctor, pharmacist, or health care provider.  2020 Elsevier/Gold Standard (2019-04-05 16:42:04)   Losartan Tablets What is this medicine? LOSARTAN (loe SAR tan) is an angiotensin II receptor blocker, also known as an ARB. It treats high blood pressure. It can slow kidney damage in some patients. It may also be used to lower the risk of stroke. This medicine may be used for other purposes; ask your health care provider or pharmacist if you have questions. COMMON BRAND NAME(S): Cozaar What should I tell my health care provider before I take this medicine? They need to know if you have any of these  conditions:  heart failure  kidney or liver disease  an unusual or allergic reaction to losartan, other medicines, foods, dyes, or preservatives  pregnant or trying to get pregnant  breast-feeding How should I use this medicine? Take this drug by mouth. Take it as directed on the prescription label at the same time every day. You can take it with or without food. If it upsets your stomach, take it with food. Keep taking it unless your health care provider tells you to stop. Talk to your health care provider about the use of this drug in children. While it may be prescribed for children as young as 6 for selected conditions, precautions do apply. Overdosage: If you think you have taken too much of this medicine contact a poison control center or emergency room at once. NOTE: This medicine is only for you. Do not share this medicine with others. What if I miss a dose? If you miss a dose, take it as soon as you can. If it is almost time for your next dose, take only that dose. Do not take double or extra doses. What may interact with this medicine?  blood pressure medicines  diuretics,  especially triamterene, spironolactone, or amiloride  fluconazole  NSAIDs, medicines for pain and inflammation, like ibuprofen or naproxen  potassium salts or potassium supplements  rifampin This list may not describe all possible interactions. Give your health care provider a list of all the medicines, herbs, non-prescription drugs, or dietary supplements you use. Also tell them if you smoke, drink alcohol, or use illegal drugs. Some items may interact with your medicine. What should I watch for while using this medicine? Visit your doctor or health care professional for regular checks on your progress. Check your blood pressure as directed. Ask your doctor or health care professional what your blood pressure should be and when you should contact him or her. Call your doctor or health care professional  if you notice an irregular or fast heart beat. Women should inform their doctor if they wish to become pregnant or think they might be pregnant. There is a potential for serious side effects to an unborn child, particularly in the second or third trimester. Talk to your health care professional or pharmacist for more information. You may get drowsy or dizzy. Do not drive, use machinery, or do anything that needs mental alertness until you know how this drug affects you. Do not stand or sit up quickly, especially if you are an older patient. This reduces the risk of dizzy or fainting spells. Alcohol can make you more drowsy and dizzy. Avoid alcoholic drinks. Avoid salt substitutes unless you are told otherwise by your doctor or health care professional. Do not treat yourself for coughs, colds, or pain while you are taking this medicine without asking your doctor or health care professional for advice. Some ingredients may increase your blood pressure. What side effects may I notice from receiving this medicine? Side effects that you should report to your doctor or health care professional as soon as possible:  confusion, dizziness, light headedness or fainting spells  decreased amount of urine passed  difficulty breathing or swallowing, hoarseness, or tightening of the throat  fast or irregular heart beat, palpitations, or chest pain  skin rash, itching  swelling of your face, lips, tongue, hands, or feet Side effects that usually do not require medical attention (report to your doctor or health care professional if they continue or are bothersome):  cough  decreased sexual function or desire  headache  nasal congestion or stuffiness  nausea or stomach pain  sore or cramping muscles This list may not describe all possible side effects. Call your doctor for medical advice about side effects. You may report side effects to FDA at 1-800-FDA-1088. Where should I keep my medicine? Keep  out of the reach of children and pets. Store at room temperature between 15 and 30 degrees C (59 and 86 degrees F). Protect from light. Keep the container tightly closed. Throw away any unused drug after the expiration date. NOTE: This sheet is a summary. It may not cover all possible information. If you have questions about this medicine, talk to your doctor, pharmacist, or health care provider.  2020 Elsevier/Gold Standard (2019-03-31 12:12:28)   Heart Failure, Diagnosis  Heart failure means that your heart is not able to pump blood in the right way. This makes it hard for your body to work well. Heart failure is usually a long-term (chronic) condition. You must take good care of yourself and follow your treatment plan from your doctor. What are the causes? This condition may be caused by:  High blood pressure.  Build up of cholesterol  and fat in the arteries.  Heart attack. This injures the heart muscle.  Heart valves that do not open and close properly.  Damage of the heart muscle. This is also called cardiomyopathy.  Lung disease.  Abnormal heart rhythms. What increases the risk? The risk of heart failure goes up as a person ages. This condition is also more likely to develop in people who:  Are overweight.  Are male.  Smoke or chew tobacco.  Abuse alcohol or illegal drugs.  Have taken medicines that can damage the heart.  Have diabetes.  Have abnormal heart rhythms.  Have thyroid problems.  Have low blood counts (anemia). What are the signs or symptoms? Symptoms of this condition include:  Shortness of breath.  Coughing.  Swelling of the feet, ankles, legs, or belly.  Losing weight for no reason.  Trouble breathing.  Waking from sleep because of the need to sit up and get more air.  Rapid heartbeat.  Being very tired.  Feeling dizzy, or feeling like you may pass out (faint).  Having no desire to eat.  Feeling like you may vomit  (nauseous).  Peeing (urinating) more at night.  Feeling confused. How is this treated?     This condition may be treated with:  Medicines. These can be given to treat blood pressure and to make the heart muscles stronger.  Changes in your daily life. These may include eating a healthy diet, staying at a healthy body weight, quitting tobacco and illegal drug use, or doing exercises.  Surgery. Surgery can be done to open blocked valves, or to put devices in the heart, such as pacemakers.  A donor heart (heart transplant). You will receive a healthy heart from a donor. Follow these instructions at home:  Treat other conditions as told by your doctor. These may include high blood pressure, diabetes, thyroid disease, or abnormal heart rhythms.  Learn as much as you can about heart failure.  Get support as you need it.  Keep all follow-up visits as told by your doctor. This is important. Summary  Heart failure means that your heart is not able to pump blood in the right way.  This condition is caused by high blood pressure, heart attack, or damage of the heart muscle.  Symptoms of this condition include shortness of breath and swelling of the feet, ankles, legs, or belly. You may also feel very tired or feel like you may vomit.  You may be treated with medicines, surgery, or changes in your daily life.  Treat other health conditions as told by your doctor. This information is not intended to replace advice given to you by your health care provider. Make sure you discuss any questions you have with your health care provider. Document Revised: 11/13/2018 Document Reviewed: 11/13/2018 Elsevier Patient Education  2020 Else[employer] Inc[employer]    Heart Failure Action Plan A heart failure action plan helps you understand what to do when you have symptoms of heart failure. Follow the plan that was created by you and your health care provider. Review your plan each time you visit your health  care provider. Red zone These signs and symptoms mean you should get medical help right away:  You have trouble breathing when resting.  You have a dry cough that is getting worse.  You have swelling or pain in your legs or abdomen that is getting worse.  You suddenly gain more than 2-3 lb (0.9-1.4 kg) in a day, or more than 5 lb (2.3 kg)  in one week. This amount may be more or less depending on your condition.  You have trouble staying awake or you feel confused.  You have chest pain.  You do not have an appetite.  You pass out. If you experience any of these symptoms:  Call your local emergency services (911 in the [place] right away or seek help at the emergency department of the nearest hospital. Yellow zone These signs and symptoms mean your condition may be getting worse and you should make some changes:  You have trouble breathing when you are active or you need to sleep with extra pillows.  You have swelling in your legs or abdomen.  You gain 2-3 lb (0.9-1.4 kg) in one day, or 5 lb (2.3 kg) in one week. This amount may be more or less depending on your condition.  You get tired easily.  You have trouble sleeping.  You have a dry cough. If you experience any of these symptoms:  Contact your health care provider within the next day.  Your health care provider may adjust your medicines. Green zone These signs mean you are doing well and can continue what you are doing:  You do not have shortness of breath.  You have very little swelling or no new swelling.  Your weight is stable (no gain or loss).  You have a normal activity level.  You do not have chest pain or any other new symptoms. Follow these instructions at home:  Take over-the-counter and prescription medicines only as told by your health care provider.  Weigh yourself daily. Your target weight is __________ lb (__________ kg). ? Call your health care provider if you gain more than __________ lb  (__________ kg) in a day, or more than __________ lb (__________ kg) in one week.  Eat a heart-healthy diet. Work with a diet and nutrition specialist (dietitian) to create an eating plan that is best for you.  Keep all follow-up visits as told by your health care provider. This is important. Where to find more information  American Heart Association: www.heart.org Summary  Follow the action plan that was created by you and your health care provider.  Get help right away if you have any symptoms in the Red zone. This information is not intended to replace advice given to you by your health care provider. Make sure you discuss any questions you have with your health care provider. Document Revised: 08/08/2017 Document Reviewed: 10/05/2016 Elsevier Patient Education  2020 [employer].

## 2019-12-31 ENCOUNTER — Ambulatory Visit: Payer: Medicare Other | Admitting: Internal Medicine

## 2020-01-10 ENCOUNTER — Emergency Department (HOSPITAL_COMMUNITY): Payer: Medicare Other

## 2020-01-10 ENCOUNTER — Encounter (HOSPITAL_COMMUNITY): Payer: Self-pay

## 2020-01-10 ENCOUNTER — Ambulatory Visit (HOSPITAL_COMMUNITY)
Admission: EM | Admit: 2020-01-10 | Discharge: 2020-01-10 | Disposition: A | Discharge status: Home / Self Care | Payer: Medicare Other | Source: Home / Self Care

## 2020-01-10 ENCOUNTER — Inpatient Hospital Stay (HOSPITAL_COMMUNITY)
Admission: EM | Admit: 2020-01-10 | Discharge: 2020-01-14 | DRG: 291 | Disposition: A | Discharge status: Home / Self Care | Payer: Medicare Other | Attending: Internal Medicine | Admitting: Internal Medicine

## 2020-01-10 ENCOUNTER — Other Ambulatory Visit: Payer: Self-pay

## 2020-01-10 DIAGNOSIS — R54 Age-related physical debility: Secondary | ICD-10-CM | POA: Diagnosis present

## 2020-01-10 DIAGNOSIS — I5023 Acute on chronic systolic (congestive) heart failure: Secondary | ICD-10-CM | POA: Diagnosis present

## 2020-01-10 DIAGNOSIS — F4321 Adjustment disorder with depressed mood: Secondary | ICD-10-CM | POA: Diagnosis present

## 2020-01-10 DIAGNOSIS — R627 Adult failure to thrive: Secondary | ICD-10-CM | POA: Diagnosis present

## 2020-01-10 DIAGNOSIS — I10 Essential (primary) hypertension: Secondary | ICD-10-CM | POA: Diagnosis present

## 2020-01-10 DIAGNOSIS — R609 Edema, unspecified: Secondary | ICD-10-CM

## 2020-01-10 DIAGNOSIS — R5381 Other malaise: Secondary | ICD-10-CM | POA: Diagnosis present

## 2020-01-10 DIAGNOSIS — Z7982 Long term (current) use of aspirin: Secondary | ICD-10-CM

## 2020-01-10 DIAGNOSIS — Z20822 Contact with and (suspected) exposure to covid-19: Secondary | ICD-10-CM | POA: Diagnosis present

## 2020-01-10 DIAGNOSIS — I878 Other specified disorders of veins: Secondary | ICD-10-CM | POA: Diagnosis present

## 2020-01-10 DIAGNOSIS — I509 Heart failure, unspecified: Secondary | ICD-10-CM

## 2020-01-10 DIAGNOSIS — R0602 Shortness of breath: Secondary | ICD-10-CM

## 2020-01-10 DIAGNOSIS — N400 Enlarged prostate without lower urinary tract symptoms: Secondary | ICD-10-CM | POA: Diagnosis present

## 2020-01-10 DIAGNOSIS — G894 Chronic pain syndrome: Secondary | ICD-10-CM | POA: Diagnosis present

## 2020-01-10 DIAGNOSIS — I714 Abdominal aortic aneurysm, without rupture, unspecified: Secondary | ICD-10-CM

## 2020-01-10 DIAGNOSIS — Z801 Family history of malignant neoplasm of trachea, bronchus and lung: Secondary | ICD-10-CM

## 2020-01-10 DIAGNOSIS — Z955 Presence of coronary angioplasty implant and graft: Secondary | ICD-10-CM

## 2020-01-10 DIAGNOSIS — I5084 End stage heart failure: Secondary | ICD-10-CM | POA: Diagnosis present

## 2020-01-10 DIAGNOSIS — R109 Unspecified abdominal pain: Secondary | ICD-10-CM

## 2020-01-10 DIAGNOSIS — G9341 Metabolic encephalopathy: Secondary | ICD-10-CM | POA: Diagnosis not present

## 2020-01-10 DIAGNOSIS — Z95828 Presence of other vascular implants and grafts: Secondary | ICD-10-CM

## 2020-01-10 DIAGNOSIS — J9601 Acute respiratory failure with hypoxia: Secondary | ICD-10-CM | POA: Diagnosis present

## 2020-01-10 DIAGNOSIS — Z79899 Other long term (current) drug therapy: Secondary | ICD-10-CM

## 2020-01-10 DIAGNOSIS — R9431 Abnormal electrocardiogram [ECG] [EKG]: Secondary | ICD-10-CM | POA: Diagnosis present

## 2020-01-10 DIAGNOSIS — Z789 Other specified health status: Secondary | ICD-10-CM

## 2020-01-10 DIAGNOSIS — Z66 Do not resuscitate: Secondary | ICD-10-CM | POA: Diagnosis not present

## 2020-01-10 DIAGNOSIS — Z681 Body mass index (BMI) 19 or less, adult: Secondary | ICD-10-CM

## 2020-01-10 DIAGNOSIS — M1990 Unspecified osteoarthritis, unspecified site: Secondary | ICD-10-CM | POA: Diagnosis present

## 2020-01-10 DIAGNOSIS — I252 Old myocardial infarction: Secondary | ICD-10-CM

## 2020-01-10 DIAGNOSIS — I739 Peripheral vascular disease, unspecified: Secondary | ICD-10-CM | POA: Diagnosis present

## 2020-01-10 DIAGNOSIS — F419 Anxiety disorder, unspecified: Secondary | ICD-10-CM | POA: Diagnosis present

## 2020-01-10 DIAGNOSIS — F17210 Nicotine dependence, cigarettes, uncomplicated: Secondary | ICD-10-CM | POA: Diagnosis present

## 2020-01-10 DIAGNOSIS — Z885 Allergy status to narcotic agent status: Secondary | ICD-10-CM

## 2020-01-10 DIAGNOSIS — J9602 Acute respiratory failure with hypercapnia: Secondary | ICD-10-CM | POA: Diagnosis present

## 2020-01-10 DIAGNOSIS — I2510 Atherosclerotic heart disease of native coronary artery without angina pectoris: Secondary | ICD-10-CM | POA: Diagnosis present

## 2020-01-10 DIAGNOSIS — Z888 Allergy status to other drugs, medicaments and biological substances status: Secondary | ICD-10-CM

## 2020-01-10 DIAGNOSIS — I110 Hypertensive heart disease with heart failure: Principal | ICD-10-CM | POA: Diagnosis present

## 2020-01-10 DIAGNOSIS — R1030 Lower abdominal pain, unspecified: Secondary | ICD-10-CM | POA: Diagnosis present

## 2020-01-10 LAB — BASIC METABOLIC PANEL
Anion gap: 9 (ref 5–15)
BUN: 5 mg/dL — ABNORMAL LOW (ref 8–23)
CO2: 30 mmol/L (ref 22–32)
Calcium: 9.1 mg/dL (ref 8.9–10.3)
Chloride: 101 mmol/L (ref 98–111)
Creatinine, Ser: 0.83 mg/dL (ref 0.61–1.24)
GFR calc Af Amer: >60 mL/min (ref >60)
GFR calc non Af Amer: >60 mL/min (ref >60)
Glucose, Bld: 113 mg/dL — ABNORMAL HIGH (ref 70–99)
Potassium: 3.7 mmol/L (ref 3.5–5.1)
Sodium: 140 mmol/L (ref 135–145)

## 2020-01-10 LAB — CBC
HCT: 41.3 % (ref 39.0–52.0)
Hemoglobin: 13.4 g/dL (ref 13.0–17.0)
MCH: 34 pg (ref 26.0–34.0)
MCHC: 32.4 g/dL (ref 30.0–36.0)
MCV: 104.8 fL — ABNORMAL HIGH (ref 80.0–100.0)
Platelets: 210 10*3/uL (ref 150–400)
RBC: 3.94 MIL/uL — ABNORMAL LOW (ref 4.22–5.81)
RDW: 13.6 % (ref 11.5–15.5)
WBC: 6.1 10*3/uL (ref 4.0–10.5)
nRBC: 0 % (ref 0.0–0.2)

## 2020-01-10 LAB — TROPONIN I (HIGH SENSITIVITY): Troponin I (High Sensitivity): 80 ng/L — ABNORMAL HIGH (ref <18)

## 2020-01-10 MED ORDER — FUROSEMIDE 10 MG/ML IJ SOLN
20.0000 mg | Freq: Once | INTRAMUSCULAR | Status: AC
  Administered 2020-01-10: 20 mg via INTRAVENOUS
  Filled 2020-01-10: qty 2

## 2020-01-10 NOTE — ED Notes (Signed)
Patient is being discharged from the Urgent Care Center and sent to the Emergency Department. Per M. Mani, [place], patient is stable but in need of higher level of care due to shortness of breath. Patient is aware and verbalizes understanding of plan of care.  Vitals:   01/10/20 1501  BP: 119/79  Pulse: 98  Resp: 16  Temp: 97.9 F (36.6 C)  SpO2: 93%

## 2020-01-10 NOTE — ED Triage Notes (Signed)
Pt c/o not being able to take a deep breath. Admitted for CHF exacerbation but states symptoms have returned

## 2020-01-10 NOTE — H&P (Signed)
[facility] History and Physical  KACYN SOUDER IWP:[id number] DOB: July 18, 1951 DOA: 01/10/2020  Referring EDP: Durwin Nora PCP: Rometta Emery, MD   Chief Complaint: SOB, Abdominal Pain, Hip Pain  HPI: RIGLEY NIESS is a 69 y.o. male with PMH of CAD s/p stent in 2004, HTN, PAD s/p stent , AAA, anxiety, arthritis, tobacco abuse and chronic pain presented to ED with multiple complaints and admitted for deconditioning and PT/OT evaluation.  Patient with multiple complaints. Reports some abdominal pain for last few weeks located across lower abdomen and reports feeling bloated. Denies nausea, vomiting, diarrhea, constipation or decreased PO intake. Has not tried medications for pain. Also reports bilateral hip pain that he has had for many years that is worse when he walks or stands for longer periods. Reports swelling in his legs is normal and actually less swollen than other times. He does feel SOB with exertion but this is baseline for him. Denies headache, dizziness, fever, chills, cough, chest pain, nausea, vomiting, diarrhea, constipation, dysuria, hematuria, hematochezia, melena, speech difficulty, trouble eating, confusion or any other complaints.  Per EDP, wife was concerned about patient's overall decline since last admission mentally and physically.   In the ED: Mild tachycardia otherwise vitals stable on room air. Labs remarkable for normal BMP and CBC.  CXR: COPD, cardiomegaly. Small right pleural effusion with right base atelectasis. Patient given 20 mg IV Lasix and EDP initially called for admission for CHF exacerbation despite stable labs and vitals. After seeing patient, discussed further with EDP and they insisted patient be admitted for overall decline as wife had stated.   Review of Systems:  All other systems negative unless noted above in HPI.   Past Medical History:  Diagnosis Date  . Acute metabolic encephalopathy 12/16/2019  . Anxiety   . Arthritis   . Arthritis  pain   . Back pain   . Bruises easily   . CHF (congestive heart failure) (HCC)   . Chills   . Coronary artery disease    with stenting in 2004, sees Dr Allyson Sabal  . Hypertension   . Inguinal hernia    right  . Inguinal hernia    Right  . NSTEMI (non-ST elevated myocardial infarction) (HCC) 03/16/2016  . Peripheral vascular disease (HCC)   . Postop check 02/18/2012   Past Surgical History:  Procedure Laterality Date  . CARDIAC CATHETERIZATION  2004   with stents x2  . FRACTURE SURGERY     left foot  . heart stents    . INGUINAL HERNIA REPAIR  01/10/2012   Procedure: HERNIA REPAIR INGUINAL ADULT;  Surgeon: Cherylynn Ridges, MD;  Location: [facility];  Service: General;  Laterality: Right;  . KNEE ARTHROSCOPY     Right  . ORTHOPEDIC SURGERY    . PERIPHERAL VASCULAR CATHETERIZATION N/A 09/15/2015   Procedure: Abdominal Aortogram;  Surgeon: Sherren Kerns, MD;  Location: [facility] INVASIVE CV LAB;  Service: Cardiovascular;  Laterality: N/A;  . RIGHT/LEFT HEART CATH AND CORONARY ANGIOGRAPHY N/A 12/20/2019   Procedure: RIGHT/LEFT HEART CATH AND CORONARY ANGIOGRAPHY;  Surgeon: Swaziland, Peter M, MD;  Location: [facility] INVASIVE CV LAB;  Service: Cardiovascular;  Laterality: N/A;  . stents in leg     right   Social History:  reports that he has been smoking cigarettes. He has been smoking about 0.50 packs per day. He has never used smokeless tobacco. He reports that he does not drink alcohol or use drugs.  Allergies  Allergen Reactions  . Benicar Hct [  Olmesartan Medoxomil-Hctz] Shortness Of Breath  . Butrans [Buprenorphine] Diarrhea and Nausea And Vomiting  . Nadolol Shortness Of Breath  . Morphine And Related Hives and Itching  . Prednisone Other (See Comments)    Chest and throat pain    No family history on file. Reports mother died of brain aneurysm and father died of lung cancer.   Prior to Admission medications   Medication Sig Start Date End Date Taking? Authorizing Provider  alprazolam Duanne Moron) 2 MG  tablet Take 2 mg by mouth 3 (three) times daily.    Yes [provider]  aspirin 81 MG chewable tablet Chew 1 tablet (81 mg total) by mouth daily. 12/22/19  Yes Hosie Poisson, MD  carvedilol (COREG) 3.125 MG tablet Take 1 tablet (3.125 mg total) by mouth 2 (two) times daily with a meal. 12/21/19  Yes Hosie Poisson, MD  furosemide (LASIX) 20 MG tablet Take 1 tablet (20 mg total) by mouth daily. 12/22/19  Yes Hosie Poisson, MD  losartan (COZAAR) 25 MG tablet Take 0.5 tablets (12.5 mg total) by mouth daily. Patient taking differently: Take 25 mg by mouth daily.  12/22/19  Yes Hosie Poisson, MD  nicotine (NICODERM CQ - DOSED IN MG/24 HOURS) 21 mg/24hr patch Place 1 patch (21 mg total) onto the skin daily. Patient taking differently: Place 21 mg onto the skin daily as needed (smoking cessation).  12/22/19  Yes Hosie Poisson, MD  oxyCODONE-acetaminophen (PERCOCET) 10-325 MG tablet Take 1 tablet by mouth 3 (three) times daily as needed for pain.   Yes [provider]  pantoprazole (PROTONIX) 40 MG tablet Take 1 tablet (40 mg total) by mouth daily at 6 (six) AM. 12/22/19  Yes Hosie Poisson, MD  rosuvastatin (CRESTOR) 40 MG tablet Take 1 tablet (40 mg total) by mouth daily at 6 PM. 12/21/19  Yes Hosie Poisson, MD  tamsulosin (FLOMAX) 0.4 MG CAPS capsule Take 0.4 mg by mouth daily.   Yes [provider]  traZODone (DESYREL) 100 MG tablet Take 1 tablet (100 mg total) by mouth at bedtime. 12/21/19  Yes Hosie Poisson, MD  ALPRAZolam Duanne Moron) 1 MG tablet Take 1 tablet (1 mg total) by mouth 3 (three) times daily as needed for anxiety. Patient not taking: Reported on 01/10/2020 12/21/19   Hosie Poisson, MD   Physical Exam: Vitals:   01/10/20 2145 01/10/20 2209 01/10/20 2300 01/11/20 0000  BP:  127/78 132/82   Pulse: (!) 106 (!) 103 (!) 115   Resp: (!) 27 (!) 24 (!) 32   Temp:      TempSrc:      SpO2: 96% 94% 94%   Weight:    61.5 kg  Height:    6' (1.829 m)    Wt Readings from Last 3  Encounters:  01/11/20 61.5 kg  12/21/19 61.5 kg  03/16/16 75.9 kg    . General:  Appears calm and comfortable. AAOx4.  . Eyes: EOMI, normal lids, irises & conjunctiva . ENT: grossly normal hearing, lips & tongue, JVD present . Neck: normal ROM . Cardiovascular: Tachycardic with regular rhythm, no m/r/g. 2+ pitting edema of bilateral lower extremities.  Marland Kitchen Respiratory: CTA bilaterally, no w/r/r. Normal respiratory effort. . Abdomen: soft, ntnd . Skin: no rash or induration seen on limited exam . Musculoskeletal: grossly normal tone BUE/BLE . Psychiatric: grossly normal mood and affect, speech fluent and appropriate . Neurologic: grossly non-focal.          Labs on Admission:  Basic Metabolic Panel: Recent Labs  Lab 01/10/20 1638  NA 140  K 3.7  CL 101  CO2 30  GLUCOSE 113*  BUN 5*  CREATININE 0.83  CALCIUM 9.1   Liver Function Tests: No results for input(s): AST, ALT, ALKPHOS, BILITOT, PROT, ALBUMIN in the last 168 hours. No results for input(s): LIPASE, AMYLASE in the last 168 hours. No results for input(s): AMMONIA in the last 168 hours. CBC: Recent Labs  Lab 01/10/20 1638  WBC 6.1  HGB 13.4  HCT 41.3  MCV 104.8*  PLT 210   Cardiac Enzymes: No results for input(s): CKTOTAL, CKMB, CKMBINDEX, TROPONINI in the last 168 hours.  BNP (last 3 results) Recent Labs    12/16/19 1041 01/10/20 1638  BNP >4,500.0* 2,714.5*    ProBNP (last 3 results) No results for input(s): PROBNP in the last 8760 hours.  CBG: No results for input(s): GLUCAP in the last 168 hours.  Radiological Exams on Admission: DG Chest 2 View  Result Date: 01/10/2020 CLINICAL DATA:  Shortness of breath EXAM: CHEST - 2 VIEW COMPARISON:  12/19/2019 FINDINGS: Cardiomegaly. There is hyperinflation of the lungs compatible with COPD. Small right pleural effusion with right base atelectasis. No confluent opacity on the left. No overt edema or acute bony abnormality. IMPRESSION: COPD, cardiomegaly.  Small right pleural effusion with right base atelectasis. Electronically Signed   By: Charlett Nose M.D.   On: 01/10/2020 17:10    EKG: Independently reviewed. HR 110. Sinus rhythm. QTc 519. No STEMI. RBBB. Similar to prior EKG on 4/13.  Assessment/Plan Principal Problem:   Physical deconditioning Active Problems:   HTN (hypertension)   Acute exacerbation of CHF (congestive heart failure) (HCC)   Chronic pain syndrome   Prolonged QT interval   Adjustment disorder with depressed mood   Abdominal pain  69 y.o. male with PMH of CAD s/p stent in 2004, HTN, PAD s/p stent , AAA, anxiety, arthritis, tobacco abuse and chronic pain presented to ED with multiple complaints and admitted for deconditioning and PT/OT evaluation.  Deconditioning - Unclear etiology - Patient reports bilateral hip pain which is fitting with arthritis; encouraged f/u with PCP for imaging/possible steroid injection - PT/OT consulted  - mentally patient is AAOx4 and able to provide reasonable history  - Xanax ordered at lower dose than home dosing; consider tapering  - Limit Percocet as able   Mild Acute CHF Exacerbation HTN - does have some SOB, JVD and lower extremity pitting edema - only takes Lasix 20 mg PO at home - S/p 20 mg IV Lasix in ED - Will give another 40 mg IV Lasix on admission - Check Mag and replace K to > 4 - I's and O's - Daily weights - Cont Losartan and Coreg - BNP ordered on admission and elevated although was much higher on previous admission - Trop ordered on admission and 80 which is c/w prior studies  - CXR: COPD, cardiomegaly. Small right pleural effusion with right base atelectasis. (c/w prior studies)  Abdominal Pain - unclear etiology, no tenderness on palpation and has not tried any remedies at home - will try Maalox as would like to avoid PPI in setting of prolonged QTc  Chronic Pain - Cont Percocet  Mood Disorder - cont PTA Xanax at half dosing; was on 2 mg TID - limit  and taper as possible due to Beers criteria especially in setting of declining functional capacity  HLD - Cont Crestor    Code Status: Full DVT Prophylaxis: Lovenox Family Communication: None Disposition Plan: Admit  under observation. Patient is at high risk for further decompensation due to age and co-morbidities. Likely discharge homeo 5/4.   Time spent: 50 minutes  Joselyn Arrow, MD [facility] Pager [phone]

## 2020-01-10 NOTE — ED Provider Notes (Signed)
[place]   MRN: [id number] DOB: [birth year]  Subjective:   Cole Fuller is a 69 y.o. male presenting for 1 month hx of persistent shortness of breath.  Admitted to the hospital for acute heart failure on 12/16/2019 through 12/21/2019.  At the time, EF was 15-20% on 12/17/2019. Did not follow up with cardiology. He does not know what medications he takes, his wife tries to help with this at home. Has a hx of MI, AAA.  His PCP is Dr. Janna Arch, advised he come to the urgent care clinic today.   No current facility-administered medications for this encounter.  Current Outpatient Medications:  .  HYDROcodone-acetaminophen (NORCO) 10-325 MG tablet, Take 1 tablet by mouth every 6 (six) hours as needed., Disp: , Rfl:  .  ALPRAZolam (XANAX) 1 MG tablet, Take 1 tablet (1 mg total) by mouth 3 (three) times daily as needed for anxiety., Disp:  , Rfl: 0 .  aspirin 81 MG chewable tablet, Chew 1 tablet (81 mg total) by mouth daily., Disp: 30 tablet, Rfl: 1 .  carvedilol (COREG) 3.125 MG tablet, Take 1 tablet (3.125 mg total) by mouth 2 (two) times daily with a meal., Disp: 60 tablet, Rfl: 0 .  furosemide (LASIX) 20 MG tablet, Take 1 tablet (20 mg total) by mouth daily., Disp: 30 tablet, Rfl: 1 .  losartan (COZAAR) 25 MG tablet, Take 0.5 tablets (12.5 mg total) by mouth daily., Disp: 30 tablet, Rfl: 1 .  nicotine (NICODERM CQ - DOSED IN MG/24 HOURS) 21 mg/24hr patch, Place 1 patch (21 mg total) onto the skin daily., Disp: 28 patch, Rfl: 0 .  pantoprazole (PROTONIX) 40 MG tablet, Take 1 tablet (40 mg total) by mouth daily at 6 (six) AM., Disp: 30 tablet, Rfl: 1 .  rosuvastatin (CRESTOR) 40 MG tablet, Take 1 tablet (40 mg total) by mouth daily at 6 PM., Disp: 30 tablet, Rfl: 1 .  tamsulosin (FLOMAX) 0.4 MG CAPS capsule, Take 0.4 mg by mouth daily., Disp: , Rfl:  .  traZODone (DESYREL) 100 MG tablet, Take 1 tablet (100 mg total) by mouth at bedtime., Disp: 30 tablet, Rfl: 0   Allergies  Allergen  Reactions  . Benicar Hct [Olmesartan Medoxomil-Hctz] Shortness Of Breath  . Butrans [Buprenorphine] Diarrhea and Nausea And Vomiting  . Nadolol Shortness Of Breath  . Morphine And Related Hives and Itching  . Prednisone Other (See Comments)    Chest and throat pain    Past Medical History:  Diagnosis Date  . Anxiety   . Arthritis   . Arthritis pain   . Back pain   . Bruises easily   . CHF (congestive heart failure) ([place])   . Chills   . Coronary artery disease    with stenting in 2004, sees Dr Gwenlyn Found  . Hypertension   . Inguinal hernia    right  . Inguinal hernia    Right  . Peripheral vascular disease [facility])      Past Surgical History:  Procedure Laterality Date  . CARDIAC CATHETERIZATION  2004   with stents x2  . FRACTURE SURGERY     left foot  . heart stents    . INGUINAL HERNIA REPAIR  01/10/2012   Procedure: HERNIA REPAIR INGUINAL ADULT;  Surgeon: Gwenyth Ober, MD;  Location: [place];  Service: General;  Laterality: Right;  . KNEE ARTHROSCOPY     Right  . ORTHOPEDIC SURGERY    . PERIPHERAL VASCULAR CATHETERIZATION N/A 09/15/2015   Procedure: Abdominal  Aortogram;  Surgeon: Sherren Kerns, MD;  Location[facility] INVASIVE CV LAB;  Service: Cardiovascular;  Laterality: N/A;  . RIGHT/LEFT HEART CATH AND CORONARY ANGIOGRAPHY N/A 12/20/2019   Procedure: RIGHT/LEFT HEART CATH AND CORONARY ANGIOGRAPHY;  Surgeon: Swaziland, Peter M, MD;  Location: [facility] INVASIVE CV LAB;  Service: Cardiovascular;  Laterality: N/A;  . stents in leg     right    No family history on file.  Social History   Tobacco Use  . Smoking status: Current Every Day Smoker    Packs/day: 0.50    Types: Cigarettes  . Smokeless tobacco: Never Used  Substance Use Topics  . Alcohol use: No    Alcohol/week: 0.0 standard drinks  . Drug use: No    ROS   Objective:   Vitals: BP 119/79   Pulse 98   Temp 97.9 F (36.6 C)   Resp 16   SpO2 93%   Physical Exam Constitutional:      General: He is not in  acute distress.    Appearance: Normal appearance. He is well-developed. He is not ill-appearing, toxic-appearing or diaphoretic.  HENT:     Head: Normocephalic and atraumatic.     Right Ear: External ear normal.     Left Ear: External ear normal.     Nose: Nose normal.     Mouth/Throat:     Mouth: Mucous membranes are moist.     Pharynx: Oropharynx is clear.  Eyes:     General: No scleral icterus.    Extraocular Movements: Extraocular movements intact.     Pupils: Pupils are equal, round, and reactive to light.  Cardiovascular:     Rate and Rhythm: Normal rate and regular rhythm.     Heart sounds: Normal heart sounds. No murmur. No friction rub. No gallop.   Pulmonary:     Effort: Pulmonary effort is normal. No respiratory distress.     Breath sounds: No stridor. Decreased breath sounds present. No wheezing, rhonchi or rales.  Musculoskeletal:     Right lower leg: Edema (2+ bilaterally) present.     Left lower leg: Edema present.  Neurological:     Mental Status: He is alert and oriented to person, place, and time.  Psychiatric:        Mood and Affect: Mood normal.        Behavior: Behavior normal.        Thought Content: Thought content normal.     Assessment and Plan :   PDMP not reviewed this encounter.  1. Shortness of breath   2. History of non-ST elevation myocardial infarction (NSTEMI)   3. Abdominal aortic aneurysm (AAA) without rupture (HCC)   4. Peripheral edema   5. Coronary artery disease involving native heart without angina pectoris, unspecified vessel or lesion type   6. Poor historian     Discussed case with Dr. Leonides Grills. Patient is not a good candidate to be evaluated in the urgent care setting for his symptom set given his hx and recent admission for heart failure. Recommended he report to the ER now. I discussed this with nursing staff who will transport him to the ER now by wheelchair.   Wallis Bamberg, PA-C 01/10/20 1558

## 2020-01-10 NOTE — ED Triage Notes (Signed)
Pt sent by UC for further evaluation of SOB and bilateral leg swelling for the past week. Pt recently discharged from hospital for CHF exacerbation.   Resp e.u at this time.

## 2020-01-10 NOTE — Discharge Instructions (Signed)
Mr. Harbaugh, I will ask our nursing staff to support you to the emergency room now as I am afraid that you are an acute on chronic heart failure.  This can be managed very carefully in the hospital as it was last month.  We will notify her wife that you are being transported to the hospital now.

## 2020-01-10 NOTE — ED Provider Notes (Signed)
[facility] EMERGENCY DEPARTMENT Provider Note   CSN: [id number] Arrival date & time: 01/10/20  1608     History Chief Complaint  Patient presents with  . Shortness of Breath  . Leg Swelling    Cole Fuller is a 69 y.o. male.  HPI Patient presents for worsening fatigue, generalized weakness, exercise intolerance, orthopnea, as well as dyspnea at rest.  History is obtained from his wife, as well as the patient himself.  His wife reports that since his last hospitalization 1 month ago, he was doing well up until 2 days ago.  Over the past 2 days, he has had a sharp decline in function due to the aforementioned symptoms.  Patient's main complaints are chronic hip and back pain.  He also endorses persistent lower abdominal pain that has been present for several months.  He reports that this pain, limits his ability to take a deep breath.  He was seen prior to arrival and urgent care clinic.  Given his severe chronic heart failure, he was sent to the ED.  He denies any recent fevers, chills, vomiting, or diarrhea.  His wife reports that he has been adherent to medications.  He has been taking double his losartan dose due to his inability to break it in half.  He has also been taking 5-6 doses of Norco 10's per day.  He is also currently taking 3x 2 mg Xanax per day.    Past Medical History:  Diagnosis Date  . Acute metabolic encephalopathy 03/11/2201  . Anxiety   . Arthritis   . Arthritis pain   . Back pain   . Bruises easily   . CHF (congestive heart failure) ([place])   . Chills   . Coronary artery disease    with stenting in 2004, sees Dr Gwenlyn Found  . Hypertension   . Inguinal hernia    right  . Inguinal hernia    Right  . NSTEMI (non-ST elevated myocardial infarction) ([place]) 03/16/2016  . Peripheral vascular disease ([place])   . Postop check 02/18/2012    Patient Active Problem List   Diagnosis Date Noted  . Abdominal pain 01/11/2020  . Physical deconditioning  01/10/2020  . Adjustment disorder with depressed mood 12/18/2019  . Acute exacerbation of CHF (congestive heart failure) ([place]) 12/16/2019  . Chronic pain syndrome 12/16/2019  . Prolonged QT interval 12/16/2019  . Baker cyst 12/16/2019  . PAD (peripheral artery disease) ([place]) 03/16/2016  . AAA (abdominal aortic aneurysm) ([place]) 03/16/2016  . HTN (hypertension) 03/16/2016  . Tobacco abuse 03/16/2016  . Right inguinal hernia 10/01/2011    Past Surgical History:  Procedure Laterality Date  . CARDIAC CATHETERIZATION  2004   with stents x2  . FRACTURE SURGERY     left foot  . heart stents    . INGUINAL HERNIA REPAIR  01/10/2012   Procedure: HERNIA REPAIR INGUINAL ADULT;  Surgeon: Gwenyth Ober, MD;  Location: [facility];  Service: General;  Laterality: Right;  . KNEE ARTHROSCOPY     Right  . ORTHOPEDIC SURGERY    . PERIPHERAL VASCULAR CATHETERIZATION N/A 09/15/2015   Procedure: Abdominal Aortogram;  Surgeon: Elam Dutch, MD;  Location: [facility];  Service: Cardiovascular;  Laterality: N/A;  . RIGHT/LEFT HEART CATH AND CORONARY ANGIOGRAPHY N/A 12/20/2019   Procedure: RIGHT/LEFT HEART CATH AND CORONARY ANGIOGRAPHY;  Surgeon: Martinique, Peter M, MD;  Location: [facility];  Service: Cardiovascular;  Laterality: N/A;  . stents in leg  right       No family history on file.  Social History   Tobacco Use  . Smoking status: Current Every Day Smoker    Packs/day: 0.50    Types: Cigarettes  . Smokeless tobacco: Never Used  Substance Use Topics  . Alcohol use: No    Alcohol/week: 0.0 standard drinks  . Drug use: No    Home Medications Prior to Admission medications   Medication Sig Start Date End Date Taking? Authorizing Provider  alprazolam Prudy Feeler) 2 MG tablet Take 2 mg by mouth 3 (three) times daily.    Yes [provider]  aspirin 81 MG chewable tablet Chew 1 tablet (81 mg total) by mouth daily. 12/22/19  Yes Kathlen Mody, MD  carvedilol (COREG) 3.125 MG  tablet Take 1 tablet (3.125 mg total) by mouth 2 (two) times daily with a meal. 12/21/19  Yes Kathlen Mody, MD  furosemide (LASIX) 20 MG tablet Take 1 tablet (20 mg total) by mouth daily. 12/22/19  Yes Kathlen Mody, MD  losartan (COZAAR) 25 MG tablet Take 0.5 tablets (12.5 mg total) by mouth daily. Patient taking differently: Take 25 mg by mouth daily.  12/22/19  Yes Kathlen Mody, MD  nicotine (NICODERM CQ - DOSED IN MG/24 HOURS) 21 mg/24hr patch Place 1 patch (21 mg total) onto the skin daily. Patient taking differently: Place 21 mg onto the skin daily as needed (smoking cessation).  12/22/19  Yes Kathlen Mody, MD  oxyCODONE-acetaminophen (PERCOCET) 10-325 MG tablet Take 1 tablet by mouth 3 (three) times daily as needed for pain.   Yes [provider]  pantoprazole (PROTONIX) 40 MG tablet Take 1 tablet (40 mg total) by mouth daily at 6 (six) AM. 12/22/19  Yes Kathlen Mody, MD  rosuvastatin (CRESTOR) 40 MG tablet Take 1 tablet (40 mg total) by mouth daily at 6 PM. 12/21/19  Yes Kathlen Mody, MD  tamsulosin (FLOMAX) 0.4 MG CAPS capsule Take 0.4 mg by mouth daily.   Yes [provider]  traZODone (DESYREL) 100 MG tablet Take 1 tablet (100 mg total) by mouth at bedtime. 12/21/19  Yes Kathlen Mody, MD  ALPRAZolam Prudy Feeler) 1 MG tablet Take 1 tablet (1 mg total) by mouth 3 (three) times daily as needed for anxiety. Patient not taking: Reported on 01/10/2020 12/21/19   Kathlen Mody, MD    Allergies    Benicar hct [olmesartan medoxomil-hctz], Butrans [buprenorphine], Nadolol, Morphine and related, and Prednisone  Review of Systems   Review of Systems  Constitutional: Positive for activity change, appetite change and fatigue. Negative for chills and fever.  HENT: Negative for congestion, ear pain and sore throat.   Eyes: Negative for pain and visual disturbance.  Respiratory: Positive for shortness of breath.   Cardiovascular: Positive for leg swelling. Negative for chest pain and  palpitations.  Gastrointestinal: Positive for abdominal pain. Negative for constipation, diarrhea, nausea and vomiting.  Genitourinary: Negative for dysuria and hematuria.  Musculoskeletal: Positive for arthralgias (Chronic) and back pain (Chronic). Negative for myalgias and neck pain.  Skin: Negative for color change and rash.  Neurological: Positive for weakness (Generalized). Negative for seizures, syncope, facial asymmetry, numbness and headaches.  Psychiatric/Behavioral: Positive for dysphoric mood (Per wife). Negative for confusion and decreased concentration.  All other systems reviewed and are negative.   Physical Exam Updated Vital Signs BP 108/67   Pulse 100   Temp 97.6 F (36.4 C) (Oral)   Resp 19   Ht 6' (1.829 m)   Wt 61.5 kg  SpO2 100%   BMI 18.39 kg/m   Physical Exam Vitals and nursing note reviewed.  Constitutional:      General: He is not in acute distress.    Appearance: He is ill-appearing. He is not toxic-appearing or diaphoretic.  HENT:     Head: Normocephalic and atraumatic.     Mouth/Throat:     Mouth: Mucous membranes are moist.     Pharynx: Oropharynx is clear.  Eyes:     Extraocular Movements: Extraocular movements intact.     Conjunctiva/sclera: Conjunctivae normal.  Neck:     Vascular: JVD present.  Cardiovascular:     Rate and Rhythm: Normal rate and regular rhythm.  Pulmonary:     Effort: Pulmonary effort is normal. No respiratory distress.     Breath sounds: Wheezing and rales present.  Chest:     Chest wall: No deformity, tenderness or crepitus.  Abdominal:     Palpations: Abdomen is soft.     Tenderness: There is abdominal tenderness. There is no guarding or rebound.  Musculoskeletal:     Cervical back: Normal range of motion and neck supple.     Right lower leg: No tenderness. Edema present.     Left lower leg: No tenderness. Edema present.  Skin:    General: Skin is warm and dry.  Neurological:     General: No focal deficit  present.     Mental Status: He is alert and oriented to person, place, and time.     Cranial Nerves: No cranial nerve deficit.  Psychiatric:        Mood and Affect: Mood normal.        Behavior: Behavior normal.     ED Results / Procedures / Treatments   Labs (all labs ordered are listed, but only abnormal results are displayed) Labs Reviewed  BASIC METABOLIC PANEL - Abnormal; Notable for the following components:      Result Value   Glucose, Bld 113 (*)    BUN 5 (*)    All other components within normal limits  CBC - Abnormal; Notable for the following components:   RBC 3.94 (*)    MCV 104.8 (*)    All other components within normal limits  BRAIN NATRIURETIC PEPTIDE - Abnormal; Notable for the following components:   B Natriuretic Peptide 2,714.5 (*)    All other components within normal limits  URINALYSIS, ROUTINE W REFLEX MICROSCOPIC - Abnormal; Notable for the following components:   Color, Urine STRAW (*)    APPearance HAZY (*)    Specific Gravity, Urine 1.004 (*)    Hgb urine dipstick SMALL (*)    Leukocytes,Ua SMALL (*)    Bacteria, UA RARE (*)    All other components within normal limits  TROPONIN I (HIGH SENSITIVITY) - Abnormal; Notable for the following components:   Troponin I (High Sensitivity) 80 (*)    All other components within normal limits  TROPONIN I (HIGH SENSITIVITY) - Abnormal; Notable for the following components:   Troponin I (High Sensitivity) 79 (*)    All other components within normal limits  RESPIRATORY PANEL BY RT PCR (FLU A&B, COVID)  MAGNESIUM  MAGNESIUM    EKG EKG Interpretation  Date/Time:  Tuesday Jan 11 2020 00:42:05 EDT Ventricular Rate:  121 PR Interval:  166 QRS Duration: 161 QT Interval:  443 QTC Calculation: 629 R Axis:   126 Text Interpretation: Sinus tachycardia RBBB and LPFB ST depression, consider ischemia, diffuse lds No significant change since last tracing  Confirmed by Rochele Raring [phone]) on 01/11/2020 12:47:57  AM   Radiology DG Chest 2 View  Result Date: 01/10/2020 CLINICAL DATA:  Shortness of breath EXAM: CHEST - 2 VIEW COMPARISON:  12/19/2019 FINDINGS: Cardiomegaly. There is hyperinflation of the lungs compatible with COPD. Small right pleural effusion with right base atelectasis. No confluent opacity on the left. No overt edema or acute bony abnormality. IMPRESSION: COPD, cardiomegaly. Small right pleural effusion with right base atelectasis. Electronically Signed   By: Charlett Nose M.D.   On: 01/10/2020 17:10    Procedures Procedures (including critical care time)  Medications Ordered in ED Medications  aspirin chewable tablet 81 mg (has no administration in time range)  carvedilol (COREG) tablet 3.125 mg (has no administration in time range)  losartan (COZAAR) tablet 12.5 mg (has no administration in time range)  rosuvastatin (CRESTOR) tablet 40 mg (has no administration in time range)  tamsulosin (FLOMAX) capsule 0.4 mg (has no administration in time range)  alum & mag hydroxide-simeth (MAALOX/MYLANTA) 200-200-20 MG/5ML suspension 15 mL (15 mLs Oral Given 01/11/20 0107)  enoxaparin (LOVENOX) injection 40 mg (has no administration in time range)  furosemide (LASIX) injection 40 mg (has no administration in time range)  oxyCODONE-acetaminophen (PERCOCET/ROXICET) 5-325 MG per tablet 1 tablet (has no administration in time range)    And  oxyCODONE (Oxy IR/ROXICODONE) immediate release tablet 5 mg (5 mg Oral Given 01/11/20 0237)  ALPRAZolam (XANAX) tablet 1 mg (1 mg Oral Given 01/11/20 0237)  furosemide (LASIX) injection 20 mg (20 mg Intravenous Given 01/10/20 2204)  potassium chloride SA (KLOR-CON) CR tablet 40 mEq (40 mEq Oral Given 01/11/20 0107)    ED Course  I have reviewed the triage vital signs and the nursing notes.  Pertinent labs & imaging results that were available during my care of the patient were reviewed by me and considered in my medical decision making (see chart for details).     MDM Rules/Calculators/A&P                      Patient is a 69 year old male with history of severe heart failure, CAD, and arthritis, who presents for a sharp decline in function at home, secondary to fatigue, generalized weakness, and exertional dyspnea. Upon arrival in the ED, he is afebrile.  He does not have significantly increased work of breathing.  He is normotensive and normothermic.  Lung sounds notable for slight wheeze and bibasilar crackles.  Physical exam is also notable for pitting edema up to his knees, greater on the left.  He states that the increased swelling on his left leg is a chronic condition.  History and exam consistent with heart failure exacerbation.  IV Lasix was ordered.  Troponins and BNP labs were ordered.  Initial troponin elevated to 80.  This is similar to his previous presentation 1 month ago, during which time he was in severe heart failure exacerbation.  Patient admitted to hospitalist service for further management.  Final Clinical Impression(s) / ED Diagnoses Final diagnoses:  None    Rx / DC Orders ED Discharge Orders    None       Gloris Manchester, MD 01/11/20 0453    Melene Plan, DO 01/11/20 1515

## 2020-01-11 ENCOUNTER — Encounter (HOSPITAL_COMMUNITY): Payer: Self-pay | Admitting: Family Medicine

## 2020-01-11 ENCOUNTER — Observation Stay (HOSPITAL_COMMUNITY): Payer: Medicare Other

## 2020-01-11 DIAGNOSIS — F17210 Nicotine dependence, cigarettes, uncomplicated: Secondary | ICD-10-CM | POA: Diagnosis present

## 2020-01-11 DIAGNOSIS — Z888 Allergy status to other drugs, medicaments and biological substances status: Secondary | ICD-10-CM | POA: Diagnosis not present

## 2020-01-11 DIAGNOSIS — R1030 Lower abdominal pain, unspecified: Secondary | ICD-10-CM | POA: Diagnosis present

## 2020-01-11 DIAGNOSIS — R54 Age-related physical debility: Secondary | ICD-10-CM | POA: Diagnosis present

## 2020-01-11 DIAGNOSIS — J9601 Acute respiratory failure with hypoxia: Secondary | ICD-10-CM | POA: Diagnosis present

## 2020-01-11 DIAGNOSIS — J9602 Acute respiratory failure with hypercapnia: Secondary | ICD-10-CM | POA: Diagnosis present

## 2020-01-11 DIAGNOSIS — N400 Enlarged prostate without lower urinary tract symptoms: Secondary | ICD-10-CM | POA: Diagnosis present

## 2020-01-11 DIAGNOSIS — Z20822 Contact with and (suspected) exposure to covid-19: Secondary | ICD-10-CM | POA: Diagnosis present

## 2020-01-11 DIAGNOSIS — I2510 Atherosclerotic heart disease of native coronary artery without angina pectoris: Secondary | ICD-10-CM | POA: Diagnosis present

## 2020-01-11 DIAGNOSIS — F4321 Adjustment disorder with depressed mood: Secondary | ICD-10-CM | POA: Diagnosis present

## 2020-01-11 DIAGNOSIS — Z7189 Other specified counseling: Secondary | ICD-10-CM

## 2020-01-11 DIAGNOSIS — Z955 Presence of coronary angioplasty implant and graft: Secondary | ICD-10-CM | POA: Diagnosis not present

## 2020-01-11 DIAGNOSIS — Z885 Allergy status to narcotic agent status: Secondary | ICD-10-CM | POA: Diagnosis not present

## 2020-01-11 DIAGNOSIS — I110 Hypertensive heart disease with heart failure: Secondary | ICD-10-CM | POA: Diagnosis present

## 2020-01-11 DIAGNOSIS — Z515 Encounter for palliative care: Secondary | ICD-10-CM

## 2020-01-11 DIAGNOSIS — I252 Old myocardial infarction: Secondary | ICD-10-CM | POA: Diagnosis not present

## 2020-01-11 DIAGNOSIS — Z66 Do not resuscitate: Secondary | ICD-10-CM | POA: Diagnosis not present

## 2020-01-11 DIAGNOSIS — I714 Abdominal aortic aneurysm, without rupture: Secondary | ICD-10-CM | POA: Diagnosis present

## 2020-01-11 DIAGNOSIS — I5084 End stage heart failure: Secondary | ICD-10-CM | POA: Diagnosis present

## 2020-01-11 DIAGNOSIS — Z681 Body mass index (BMI) 19 or less, adult: Secondary | ICD-10-CM | POA: Diagnosis not present

## 2020-01-11 DIAGNOSIS — R109 Unspecified abdominal pain: Secondary | ICD-10-CM

## 2020-01-11 DIAGNOSIS — I739 Peripheral vascular disease, unspecified: Secondary | ICD-10-CM | POA: Diagnosis present

## 2020-01-11 DIAGNOSIS — Z95828 Presence of other vascular implants and grafts: Secondary | ICD-10-CM | POA: Diagnosis not present

## 2020-01-11 DIAGNOSIS — I5023 Acute on chronic systolic (congestive) heart failure: Secondary | ICD-10-CM | POA: Diagnosis present

## 2020-01-11 DIAGNOSIS — R627 Adult failure to thrive: Secondary | ICD-10-CM | POA: Diagnosis present

## 2020-01-11 DIAGNOSIS — R5381 Other malaise: Secondary | ICD-10-CM | POA: Diagnosis not present

## 2020-01-11 DIAGNOSIS — G894 Chronic pain syndrome: Secondary | ICD-10-CM | POA: Diagnosis present

## 2020-01-11 DIAGNOSIS — G9341 Metabolic encephalopathy: Secondary | ICD-10-CM | POA: Diagnosis not present

## 2020-01-11 LAB — URINALYSIS, ROUTINE W REFLEX MICROSCOPIC
Bilirubin Urine: NEGATIVE
Glucose, UA: NEGATIVE mg/dL
Ketones, ur: NEGATIVE mg/dL
Nitrite: NEGATIVE
Protein, ur: NEGATIVE mg/dL
Specific Gravity, Urine: 1.004 — ABNORMAL LOW (ref 1.005–1.030)
pH: 8 (ref 5.0–8.0)

## 2020-01-11 LAB — BASIC METABOLIC PANEL
Anion gap: 15 (ref 5–15)
BUN: 5 mg/dL — ABNORMAL LOW (ref 8–23)
CO2: 23 mmol/L (ref 22–32)
Calcium: 9.3 mg/dL (ref 8.9–10.3)
Chloride: 104 mmol/L (ref 98–111)
Creatinine, Ser: 0.79 mg/dL (ref 0.61–1.24)
GFR calc Af Amer: >60 mL/min (ref >60)
GFR calc non Af Amer: >60 mL/min (ref >60)
Glucose, Bld: 116 mg/dL — ABNORMAL HIGH (ref 70–99)
Potassium: 4 mmol/L (ref 3.5–5.1)
Sodium: 142 mmol/L (ref 135–145)

## 2020-01-11 LAB — POCT I-STAT 7, (LYTES, BLD GAS, ICA,H+H)
Acid-Base Excess: 1 mmol/L (ref 0.0–2.0)
Bicarbonate: 29.7 mmol/L — ABNORMAL HIGH (ref 20.0–28.0)
Calcium, Ion: 1.31 mmol/L (ref 1.15–1.40)
HCT: 46 % (ref 39.0–52.0)
Hemoglobin: 15.6 g/dL (ref 13.0–17.0)
O2 Saturation: 97 %
Potassium: 4.2 mmol/L (ref 3.5–5.1)
Sodium: 140 mmol/L (ref 135–145)
TCO2: 32 mmol/L (ref 22–32)
pCO2 arterial: 62.9 mmHg — ABNORMAL HIGH (ref 32.0–48.0)
pH, Arterial: 7.283 — ABNORMAL LOW (ref 7.350–7.450)
pO2, Arterial: 101 mmHg (ref 83.0–108.0)

## 2020-01-11 LAB — CBC WITH DIFFERENTIAL/PLATELET
Abs Immature Granulocytes: 0.03 10*3/uL (ref 0.00–0.07)
Basophils Absolute: 0.1 10*3/uL (ref 0.0–0.1)
Basophils Relative: 1 %
Eosinophils Absolute: 0 10*3/uL (ref 0.0–0.5)
Eosinophils Relative: 0 %
HCT: 51 % (ref 39.0–52.0)
Hemoglobin: 16 g/dL (ref 13.0–17.0)
Immature Granulocytes: 0 %
Lymphocytes Relative: 25 %
Lymphs Abs: 2.7 10*3/uL (ref 0.7–4.0)
MCH: 33.8 pg (ref 26.0–34.0)
MCHC: 31.4 g/dL (ref 30.0–36.0)
MCV: 107.8 fL — ABNORMAL HIGH (ref 80.0–100.0)
Monocytes Absolute: 0.6 10*3/uL (ref 0.1–1.0)
Monocytes Relative: 5 %
Neutro Abs: 7.5 10*3/uL (ref 1.7–7.7)
Neutrophils Relative %: 69 %
Platelets: 244 10*3/uL (ref 150–400)
RBC: 4.73 MIL/uL (ref 4.22–5.81)
RDW: 13.9 % (ref 11.5–15.5)
WBC: 10.9 10*3/uL — ABNORMAL HIGH (ref 4.0–10.5)
nRBC: 0 % (ref 0.0–0.2)

## 2020-01-11 LAB — MAGNESIUM
Magnesium: 2.1 mg/dL (ref 1.7–2.4)
Magnesium: 1.9 mg/dL (ref 1.7–2.4)

## 2020-01-11 LAB — TROPONIN I (HIGH SENSITIVITY): Troponin I (High Sensitivity): 79 ng/L — ABNORMAL HIGH (ref <18)

## 2020-01-11 LAB — RESPIRATORY PANEL BY RT PCR (FLU A&B, COVID)
Influenza A by PCR: NEGATIVE
Influenza B by PCR: NEGATIVE
SARS Coronavirus 2 by RT PCR: NEGATIVE

## 2020-01-11 LAB — BRAIN NATRIURETIC PEPTIDE: B Natriuretic Peptide: 2714.5 pg/mL — ABNORMAL HIGH (ref 0.0–100.0)

## 2020-01-11 LAB — MRSA PCR SCREENING: MRSA by PCR: NEGATIVE

## 2020-01-11 LAB — D-DIMER, QUANTITATIVE: D-Dimer, Quant: 2.05 ug/mL-FEU — ABNORMAL HIGH (ref 0.00–0.50)

## 2020-01-11 LAB — GLUCOSE, CAPILLARY: Glucose-Capillary: 108 mg/dL — ABNORMAL HIGH (ref 70–99)

## 2020-01-11 MED ORDER — ALPRAZOLAM 0.5 MG PO TABS
1.0000 mg | ORAL_TABLET | Freq: Three times a day (TID) | ORAL | Status: DC | PRN
  Administered 2020-01-11 – 2020-01-12 (×3): 1 mg via ORAL
  Filled 2020-01-11 (×3): qty 2

## 2020-01-11 MED ORDER — ENOXAPARIN SODIUM 40 MG/0.4ML ~~LOC~~ SOLN
40.0000 mg | SUBCUTANEOUS | Status: DC
  Administered 2020-01-11 – 2020-01-14 (×4): 40 mg via SUBCUTANEOUS
  Filled 2020-01-11 (×4): qty 0.4

## 2020-01-11 MED ORDER — FUROSEMIDE 10 MG/ML IJ SOLN
40.0000 mg | Freq: Once | INTRAMUSCULAR | Status: AC
  Administered 2020-01-11: 40 mg via INTRAVENOUS
  Filled 2020-01-11: qty 4

## 2020-01-11 MED ORDER — SODIUM CHLORIDE 0.9 % IV SOLN: INTRAVENOUS | Status: DC | PRN

## 2020-01-11 MED ORDER — ALPRAZOLAM 0.5 MG PO TABS
0.5000 mg | ORAL_TABLET | Freq: Three times a day (TID) | ORAL | Status: DC | PRN
  Administered 2020-01-11: 0.5 mg via ORAL
  Filled 2020-01-11: qty 1

## 2020-01-11 MED ORDER — POTASSIUM CHLORIDE CRYS ER 20 MEQ PO TBCR
40.0000 meq | EXTENDED_RELEASE_TABLET | Freq: Once | ORAL | Status: AC
  Administered 2020-01-11: 40 meq via ORAL
  Filled 2020-01-11: qty 2

## 2020-01-11 MED ORDER — IOHEXOL 350 MG/ML SOLN
80.0000 mL | Freq: Once | INTRAVENOUS | Status: AC | PRN
  Administered 2020-01-11: 80 mL via INTRAVENOUS

## 2020-01-11 MED ORDER — OXYCODONE HCL 5 MG PO TABS
5.0000 mg | ORAL_TABLET | Freq: Four times a day (QID) | ORAL | Status: DC | PRN
  Administered 2020-01-11 – 2020-01-14 (×10): 5 mg via ORAL
  Filled 2020-01-11 (×10): qty 1

## 2020-01-11 MED ORDER — OXYCODONE-ACETAMINOPHEN 5-325 MG PO TABS
1.0000 | ORAL_TABLET | Freq: Four times a day (QID) | ORAL | Status: DC | PRN
  Administered 2020-01-11 – 2020-01-14 (×11): 1 via ORAL
  Filled 2020-01-11 (×11): qty 1

## 2020-01-11 MED ORDER — MAGNESIUM SULFATE 2 GM/50ML IV SOLN
2.0000 g | Freq: Once | INTRAVENOUS | Status: AC
  Administered 2020-01-11: 2 g via INTRAVENOUS
  Filled 2020-01-11: qty 50

## 2020-01-11 MED ORDER — ALPRAZOLAM 0.25 MG PO TABS: 1.0000 mg | ORAL_TABLET | Freq: Three times a day (TID) | ORAL | Status: DC

## 2020-01-11 MED ORDER — ASPIRIN 81 MG PO CHEW
81.0000 mg | CHEWABLE_TABLET | Freq: Every day | ORAL | Status: DC
  Administered 2020-01-12 – 2020-01-14 (×3): 81 mg via ORAL
  Filled 2020-01-11 (×3): qty 1

## 2020-01-11 MED ORDER — ORAL CARE MOUTH RINSE
15.0000 mL | Freq: Two times a day (BID) | OROMUCOSAL | Status: DC
  Administered 2020-01-12 – 2020-01-14 (×5): 15 mL via OROMUCOSAL

## 2020-01-11 MED ORDER — ALPRAZOLAM 0.25 MG PO TABS
1.0000 mg | ORAL_TABLET | Freq: Three times a day (TID) | ORAL | Status: DC
  Administered 2020-01-11: 1 mg via ORAL
  Filled 2020-01-11: qty 4

## 2020-01-11 MED ORDER — ROSUVASTATIN CALCIUM 20 MG PO TABS
40.0000 mg | ORAL_TABLET | Freq: Every day | ORAL | Status: DC
  Administered 2020-01-11 – 2020-01-13 (×3): 40 mg via ORAL
  Filled 2020-01-11 (×3): qty 2

## 2020-01-11 MED ORDER — TAMSULOSIN HCL 0.4 MG PO CAPS
0.4000 mg | ORAL_CAPSULE | Freq: Every day | ORAL | Status: DC
  Administered 2020-01-12 – 2020-01-14 (×3): 0.4 mg via ORAL
  Filled 2020-01-11 (×3): qty 1

## 2020-01-11 MED ORDER — CHLORHEXIDINE GLUCONATE CLOTH 2 % EX PADS
6.0000 | MEDICATED_PAD | Freq: Every day | CUTANEOUS | Status: DC
  Administered 2020-01-11 – 2020-01-14 (×4): 6 via TOPICAL

## 2020-01-11 MED ORDER — BUDESONIDE 0.25 MG/2ML IN SUSP
0.2500 mg | Freq: Two times a day (BID) | RESPIRATORY_TRACT | Status: DC
  Administered 2020-01-11 – 2020-01-14 (×7): 0.25 mg via RESPIRATORY_TRACT
  Filled 2020-01-11 (×7): qty 2

## 2020-01-11 MED ORDER — ALUM & MAG HYDROXIDE-SIMETH 200-200-20 MG/5ML PO SUSP
30.0000 mL | Freq: Four times a day (QID) | ORAL | Status: DC | PRN
  Filled 2020-01-11: qty 30

## 2020-01-11 MED ORDER — OXYCODONE-ACETAMINOPHEN 10-325 MG PO TABS: 1.0000 | ORAL_TABLET | Freq: Four times a day (QID) | ORAL | Status: DC | PRN

## 2020-01-11 MED ORDER — CARVEDILOL 3.125 MG PO TABS
3.1250 mg | ORAL_TABLET | Freq: Two times a day (BID) | ORAL | Status: DC
  Administered 2020-01-11 – 2020-01-14 (×6): 3.125 mg via ORAL
  Filled 2020-01-11 (×8): qty 1

## 2020-01-11 MED ORDER — ALUM & MAG HYDROXIDE-SIMETH 200-200-20 MG/5ML PO SUSP
15.0000 mL | Freq: Four times a day (QID) | ORAL | Status: DC | PRN
  Administered 2020-01-11: 15 mL via ORAL
  Filled 2020-01-11 (×2): qty 30

## 2020-01-11 MED ORDER — LOSARTAN POTASSIUM 25 MG PO TABS
12.5000 mg | ORAL_TABLET | Freq: Every day | ORAL | Status: DC
  Administered 2020-01-12 – 2020-01-14 (×3): 12.5 mg via ORAL
  Filled 2020-01-11: qty 0.5
  Filled 2020-01-11 (×2): qty 1
  Filled 2020-01-11: qty 0.5

## 2020-01-11 NOTE — ED Notes (Addendum)
Pt became very SOB- pulled out his IV, pulled off all monitoring cords. This RN placed pt on monitor- sats low 80's on 4L [place]. Placed pt on nonrebreather- pt sitting up, tripod breathing, using accessory muscles. Called MD- awaiting answer. Resp at bedside. Placing pt on Bipap. ED MD at bedside now.

## 2020-01-11 NOTE — Consult Note (Signed)
NAME:  Cole Fuller, MRN:  [id number], DOB:  October 04, 1950, LOS: 0 ADMISSION DATE:  01/10/2020, CONSULTATION DATE:  5/4 REFERRING MD:  Dr. Marice Potter TRH, CHIEF COMPLAINT:  Hypoxia   Brief History   69 year old male with recent dx of severe HFrEF presented to [facility] 5/3 admitted for abdominal pain. Decompensated from a resp standpoint in the morning hours of 5/4 prompting BiPAP initiation. PCCM consulted.   History of present illness   69 year old male with PMH as below, which is significant for  CAD, HTN, and NSTEMI. He was recently admitted 4/8-4/13 initially for pneumonia, but on further evaluation was found to have a new LVEF 15-20%. He underwent cardiac catheterization, which demonstrated some coronary disease, but cardiomyopathy was far out of proportion. Etiology remained unclear and medical therapy was recommended by cardiology including carvediolol and losartan. BP would not tolerate entresto at that time. He was discharged 4/13. Since discharge, wife reports that he has been declining physically and mentally.   He again presented to [facility] ED on 5/3 with complaints of abd pain, SOB, and overall decline. Complained of orthopnea. Abdominal pain has persisted x months and is located in the lower abdomen. On examination in the ED, he had significant work of breathing and had crackles on exam. Exam was felt to be consistent with CHF exacerbation. The patient was treated with lasix with improvement and was admitted to the hospitalist service. Then in the morning hours of 5/4 he developed hypoxia and respiratory extremis. He was placed on NRB and ultimately required BiPAP, with which he improved. He was given an additional dose of lasix and began to have urinary output. PCCM consulted for further management.   Past Medical History   has a past medical history of Acute metabolic encephalopathy (05/19/3715), Anxiety, Arthritis, Arthritis pain, Back pain, Bruises easily, CHF (congestive heart failure)  ([place]), Chills, Coronary artery disease, Hypertension, Inguinal hernia, Inguinal hernia, NSTEMI (non-ST elevated myocardial infarction) ([place]) (03/16/2016), Peripheral vascular disease ([place]), and Postop check (02/18/2012).   Significant Hospital Events   5/3 admit for CHF 5/4 transfer to ICU for BiPAP after respiratory decompensation.   Consults:  PCCM  Procedures:    Significant Diagnostic Tests:  CTA chest 5/4 > No evidence of pulmonary embolism. Marked cardiomegaly with findings most suggestive of pulmonary edema. Marked cardiomegaly with small to moderate-sized pericardial effusion. Nonocclusive debris within the carina with near occlusive debris within the bronchus intermedius. Mediastinal lymphadenopathy.  Micro Data:  COVID/Flu swab 5/4 Blood 5/4 > Urine 5/4 >  Antimicrobials:    Interim history/subjective:    Objective   Blood pressure 127/89, pulse 91, temperature 97.6 F (36.4 C), temperature source Oral, resp. rate (!) 24, height 6' (1.829 m), weight 61.5 kg, SpO2 100 %.        Intake/Output Summary (Last 24 hours) at 01/11/2020 0956 Last data filed at 01/11/2020 0936 Gross per 24 hour  Intake --  Output 1100 ml  Net -1100 ml   Filed Weights   01/11/20 0000  Weight: 61.5 kg    Examination: General: Frail elderly appearing male in NAD HENT: [place]/AT, PERRL, no JVD Lungs: Coarse bibasilar crackles. Cardiovascular: RRR, no MRG Abdomen: Soft, non-tender, non-distended Extremities: No acute deformity or edema Neuro: Alert, oriented to self and place, follows commands GU: [place]  Resolved Hospital Problem list     Assessment & Plan:   Acute hypoxemic and hypercarbic respiratory failure: history and presentation consistent with pulmonary edema. CT does describe some  debris at the carina and bronchus intermedius, which would raise concern for aspiration.  - BiPAP as needed. - Continue diuresis, trend electrolytes. - Hold off on ABX for now. - SpO2 goal > 88-95%.  FiO2 weaned to 40%  Acute on chronic HFrEF: LVEF 15-20% on recent admission.  - Continue home losartan and carvedilol - Continue rosuvastatin  - Increase lasix dosing - There were talks about possible ICD if EF does not improve s/p 3 months medical therapy - Consider cardiology consultation if he does not improve.  - Strict I&O  Abdominal pain: improved now that work of breathing is better. Non-tender. Worked up with CT scan recently which was negative.  - NPO for now - Monitor clinically  Acute metabolic encephalopathy: hypoxia, hypercarbia. - Hold sedating medications - Add back benzos when able to avoid withdrawal.  - BiPAP will hopefully correct this. Seems to be improving.   AAA:  - Follow up US in 12/2020  Deconditioning: - Palliative care following. Appreciate their input.    Best practice:  Diet: NPO Pain/Anxiety/Delirium protocol (if indicated): NA VAP protocol (if indicated): NA DVT prophylaxis: Lovenox GI prophylaxis: NA Glucose control: SSI Mobility: BR Code Status: DNR per Palliative conversation. Wife confirmed to me.  Family Communication: Wife updated via phone Disposition: ICU  Labs   CBC: Recent Labs  Lab 01/10/20 1638 01/11/20 0820 01/11/20 0828  WBC 6.1 10.9*  --   NEUTROABS  --  7.5  --   HGB 13.4 16.0 15.6  HCT 41.3 51.0 46.0  MCV 104.8* 107.8*  --   PLT 210 244  --     Basic Metabolic Panel: Recent Labs  Lab 01/10/20 1638 01/10/20 2351 01/11/20 0447 01/11/20 0820 01/11/20 0828  NA 140  --   --  142 140  K 3.7  --   --  4.0 4.2  CL 101  --   --  104  --   CO2 30  --   --  23  --   GLUCOSE 113*  --   --  116*  --   BUN 5*  --   --  5*  --   CREATININE 0.83  --   --  0.79  --   CALCIUM 9.1  --   --  9.3  --   MG  --  2.1 1.9  --   --    GFR: Estimated Creatinine Clearance: 75.8 mL/min (by C-G formula based on SCr of 0.79 mg/dL). Recent Labs  Lab 01/10/20 1638 01/11/20 0820  WBC 6.1 10.9*    Liver Function Tests: No  results for input(s): AST, ALT, ALKPHOS, BILITOT, PROT, ALBUMIN in the last 168 hours. No results for input(s): LIPASE, AMYLASE in the last 168 hours. No results for input(s): AMMONIA in the last 168 hours.  ABG    Component Value Date/Time   PHART 7.283 (L) 01/11/2020 0828   PCO2ART 62.9 (H) 01/11/2020 0828   PO2ART 101 01/11/2020 0828   HCO3 29.7 (H) 01/11/2020 0828   TCO2 32 01/11/2020 0828   O2SAT 97.0 01/11/2020 0828     Coagulation Profile: No results for input(s): INR, PROTIME in the last 168 hours.  Cardiac Enzymes: No results for input(s): CKTOTAL, CKMB, CKMBINDEX, TROPONINI in the last 168 hours.  HbA1C: Hgb A1c MFr Bld  Date/Time Value Ref Range Status  03/16/2016 11:20 PM 5.5 4.8 - 5.6 % Final    Comment:    (NOTE)         Pre-diabetes:  5.7 - 6.4         Diabetes: >6.4         Glycemic control for adults with diabetes: <7.0     CBG: No results for input(s): GLUCAP in the last 168 hours.  Review of Systems:   Patient is encephalopathic and/or intubated. Therefore history has been obtained from chart review.  Past Medical History  He,  has a past medical history of Acute metabolic encephalopathy (12/16/2019), Anxiety, Arthritis, Arthritis pain, Back pain, Bruises easily, CHF (congestive heart failure) ([facility]), Chills, Coronary artery disease, Hypertension, Inguinal hernia, Inguinal hernia, NSTEMI (non-ST elevated myocardial infarction) ([facility]) (03/16/2016), Peripheral vascular disease ([facility]), and Postop check (02/18/2012).   Surgical History    Past Surgical History:  Procedure Laterality Date  . CARDIAC CATHETERIZATION  2004   with stents x2  . FRACTURE SURGERY     left foot  . heart stents    . INGUINAL HERNIA REPAIR  01/10/2012   Procedure: HERNIA REPAIR INGUINAL ADULT;  Surgeon: Cherylynn Ridges, MD;  Location: [facility] OR;  Service: General;  Laterality: Right;  . KNEE ARTHROSCOPY     Right  . ORTHOPEDIC SURGERY    . PERIPHERAL VASCULAR CATHETERIZATION N/A 09/15/2015    Procedure: Abdominal Aortogram;  Surgeon: Sherren Kerns, MD;  [facility] INVASIVE CV LAB;  Service: Cardiovascular;  Laterality: N/A;  . RIGHT/LEFT HEART CATH AND CORONARY ANGIOGRAPHY N/A 12/20/2019   Procedure: RIGHT/LEFT HEART CATH AND CORONARY ANGIOGRAPHY;  Surgeon: [place], Peter M, MD;  Location: [facility] INVASIVE CV LAB;  Service: Cardiovascular;  Laterality: N/A;  . stents in leg     right     Social History   reports that he has been smoking cigarettes. He has been smoking about 0.50 packs per day. He has never used smokeless tobacco. He reports that he does not drink alcohol or use drugs.   Family History   His family history is not on file.   Allergies Allergies  Allergen Reactions  . Benicar Hct [Olmesartan Medoxomil-Hctz] Shortness Of Breath  . Butrans [Buprenorphine] Diarrhea and Nausea And Vomiting  . Nadolol Shortness Of Breath  . Morphine And Related Hives and Itching  . Prednisone Other (See Comments)    Chest and throat pain     Home Medications  Prior to Admission medications   Medication Sig Start Date End Date Taking? Authorizing Provider  alprazolam Prudy Feeler) 2 MG tablet Take 2 mg by mouth 3 (three) times daily.    Yes [provider]  aspirin 81 MG chewable tablet Chew 1 tablet (81 mg total) by mouth daily. 12/22/19  Yes Kathlen Mody, MD  carvedilol (COREG) 3.125 MG tablet Take 1 tablet (3.125 mg total) by mouth 2 (two) times daily with a meal. 12/21/19  Yes Kathlen Mody, MD  furosemide (LASIX) 20 MG tablet Take 1 tablet (20 mg total) by mouth daily. 12/22/19  Yes Kathlen Mody, MD  losartan (COZAAR) 25 MG tablet Take 0.5 tablets (12.5 mg total) by mouth daily. Patient taking differently: Take 25 mg by mouth daily.  12/22/19  Yes Kathlen Mody, MD  nicotine (NICODERM CQ - DOSED IN MG/24 HOURS) 21 mg/24hr patch Place 1 patch (21 mg total) onto the skin daily. Patient taking differently: Place 21 mg onto the skin daily as needed (smoking cessation).  12/22/19   Yes Kathlen Mody, MD  oxyCODONE-acetaminophen (PERCOCET) 10-325 MG tablet Take 1 tablet by mouth 3 (three) times daily as needed for pain.   Yes [provider]  pantoprazole (PROTONIX) 40 MG tablet Take 1 tablet (40 mg total) by mouth daily at 6 (six) AM. 12/22/19  Yes Kathlen Mody, MD  rosuvastatin (CRESTOR) 40 MG tablet Take 1 tablet (40 mg total) by mouth daily at 6 PM. 12/21/19  Yes Kathlen Mody, MD  tamsulosin (FLOMAX) 0.4 MG CAPS capsule Take 0.4 mg by mouth daily.   Yes [provider]  traZODone (DESYREL) 100 MG tablet Take 1 tablet (100 mg total) by mouth at bedtime. 12/21/19  Yes Kathlen Mody, MD  ALPRAZolam Prudy Feeler) 1 MG tablet Take 1 tablet (1 mg total) by mouth 3 (three) times daily as needed for anxiety. Patient not taking: Reported on 01/10/2020 12/21/19   Kathlen Mody, MD     Critical care time: 35 mins     Joneen Roach, AGACNP-BC  Pulmonary/Critical Care  See [facility] for personal pager PCCM on call pager [phone]  01/11/2020 11:08 AM

## 2020-01-11 NOTE — Progress Notes (Signed)
BiPAP pt transported from [facility] to 2M11 without any complications.

## 2020-01-11 NOTE — Progress Notes (Signed)
69 year old male with a history of ischemic cardiomyopathy, CHF with EF of 15 to 20%, with recent cath about a month ago (recommended medical management), presenting with adult failure to thrive, worsening dyspnea, orthopnea, abdominal pain for the past few days.  In the ED, patient initially improved with diuresis but then on 5/4, developed recurrent respiratory failure, with sats dropping to the 80s, ABG showing acute hypoxia and hypercapnic respiratory failure, requiring initiation of BiPAP.  CTA chest done showed no evidence of PE, but does show cardiomegaly with moderate pericardial effusion, volume overload, with possible large airway debris in the distal trachea.  PCCM was promptly consulted, of which they graciously agreed to take over patient's care.  I also consulted palliative care to discuss goals of care due to patient's very poor prognosis.    [facility] hospitalist will sign off, please kindly call us back once he is stable to leave the ICU, for further management.

## 2020-01-11 NOTE — Consult Note (Addendum)
Palliative Medicine Inpatient Consult Note  Reason for consult:  [place] discussion, hx of end stage CHF with EF of 15-20%. severly deconditioned with very poor prognosis  HPI:  Per intake H&P --> Cole Fuller is a 69 y.o. male with PMH of CADs/pstent in2004,HTN, PADs/p stent,AAA,anxiety, arthritis,tobacco abuseand chronic painpresented to ED with multiple complaints and admitted for deconditioning and PT/OT evaluation.  Palliative care was asked to get involved in the setting of end stage heart failure.   Clinical Assessment/Goals of Care: I have reviewed medical records including EPIC notes, labs and imaging, received report from bedside RN, assessed the patient. I met with [name] this morning, he was noted to be on bipap and quite lethargic. He was not able to answer question appropriately.    I called [name] to further discuss diagnosis prognosis, GOC, EOL wishes, disposition and options.   I introduced Palliative Medicine as specialized medical care for people living with serious illness. It focuses on providing relief from the symptoms and stress of a serious illness. The goal is to improve quality of life for both the patient and the family.  I asked [name] to tell me a little about [name]. She shared that he is from [place] originally. He and his family moved often during his childhood in the setting of his fathers alcoholism. At some point they ended up in [place], [place]. where the patient has stayed since. He and [name] have been together for over twenty five years. [name] had a son from a prior relationship who died of an overdose. [name] has three grown children from a prior relationship. They have no children together. 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  He use to enjoying being outdoors and would frequently pan for gold in the woods. He loved walking along the beach as well. 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   As far as illicit habits [name] use to drink and smoke marijuana though he stopped fifteen years ago. He is still an active smoker. 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 He started these medications over twenty years ago after his foot injury and his misuse of them have become more evident over the years.   I asked [name] who Deaire had been doing at home. She shares that he knew he had very bad heart failure. He would often take a prolonged period of time to perform bADLs inclusive of brushing his teeth, getting dress, and preparing his coffee. He knew that his "heart was all messed up." As of recently Cole was able to move very little without experiencing profound weakness and dyspnea.  A detailed discussion was had today regarding advanced directives, [name] said that [name] never wanted to talk about death so these had not been filled out.  By Cole Fuller would be Psychiatric nurse.   Concepts specific to code status, artifical feeding and hydration, continued IV antibiotics and rehospitalization was had. Per Jeannine Kitten was very much aware of his worsening disease. He would share with her that he knows he has limited time. After his sister recently died they both discussed not wanting to be on life support. [name] shares that [name] is a DNAR/DNI. She is hopeful that he can improve on the present measures though if he does not we discussed what the future may look like.    We discussed difference between a aggressive medical intervention  path  and a palliative comfort care path. I explained that if Cole Fuller does not improve it would be reasonable to transition focus from curative to symptom relief. [name] has shared that [name] would not find a life where he was dependant on her to be acceptable from a quality perspective. I introduced the concept of hospice which ensures dignity  and quality at the end of life. I shared with [name] that even if he makes it through this acute event he is appropriate for hospice consideration. She said that she is thankful doe this information and plans to come in this afternoon.   Discussed the importance of continued conversation with family and their  medical providers regarding overall plan of care and treatment options, ensuring decisions are within the context of the patients values and GOCs.  SUMMARY OF RECOMMENDATIONS   DNAR/DNI  Treat the treatable for the time being, 24-48 hours if no improvement will readdress GOC  Refer for home hospice if patient stabilizes, if not after timed trial would transition focus to comfort oriented in house  Chaplain consult  Code Status/Advance Care Planning: DNAR/DNI   Palliative Prophylaxis:   Oral care, constipation, turn Q2H  Additional Recommendations (Limitations, Scope, Preferences):  Treat what is treatable, if not improvement transition to comfort focus   Psycho-social/Spiritual:   Desire for further Chaplaincy support: Yes  Additional Recommendations: Education on hospice, comfort oriented care   Prognosis:   Extremely poor. Guarded condition during this admission. < 6 months in the setting of advanced heart failure   Discharge Planning:   PPS: 10%    This conversation/these recommendations were discussed with patient primary care team, Dr. Lamonte Sakai  Time In: 0930 Time Out: 1050 Total Time: 80 Greater than 50%  of this time was spent counseling and coordinating care related to the above assessment and plan.  [facility] Team Team Cell Phone: [phone] Please utilize secure chat with additional questions, if there is no response within 30 minutes please call the above phone number  Palliative Medicine Team providers are available by phone from 7am to 7pm daily and can be reached through the team cell phone.  Should this  patient require assistance outside of these hours, please call the patient's attending physician.   Please contact Palliative Medicine Team phone at [phone] for questions and concerns.  For individual provider: See Shea Evans

## 2020-01-11 NOTE — ED Notes (Signed)
Paged admitting about pt's increase in HR. Pt is now 125-130. Pt is also sitting on the side of the bed rocking back and fourth stating his stomach is burning. Admitting advised this RN to give pt his PRN oxy and xanax to see if that would help calm pt down and help with his pain and HR. This RN will continue to monitor.

## 2020-01-11 NOTE — ED Notes (Signed)
Breakfast Ordered 

## 2020-01-11 NOTE — Progress Notes (Signed)
PT Cancellation Note  Patient Details Name: ELHADJ GIRTON MRN: [id number] DOB: 1951/01/11   Cancelled Treatment:    Reason Eval/Treat Not Completed: Medical issues which prohibited therapy Pt just admitted and went to ICU.  Pt was on bipap.  Will f/u at later date as pt just admitted and not stable for PT. Royetta Asal, PT Acute Rehab Services Pager [phone] [facility] [phone] [facility] [phone]    Rayetta Humphrey 01/11/2020, 4:52 PM

## 2020-01-11 NOTE — Progress Notes (Signed)
Upper and lower dentures at bedside. Pt name on denture cup.

## 2020-01-11 NOTE — ED Notes (Signed)
Paged admitting about pt concerns.

## 2020-01-11 NOTE — Progress Notes (Signed)
eLink Physician-Brief Progress Note Patient Name: Cole Fuller DOB: 12-Feb-1951 MRN: [id number]   Date of Service  01/11/2020  HPI/Events of Note  Anxiety - Currently on Xanax 0.5 mg PO TID. Takes 2 mg PO TID at home.   eICU Interventions  Will increase Xanax to 1 mp PO TID.     Intervention Category Major Interventions: Delirium, psychosis, severe agitation - evaluation and management  [name],[name] 01/11/2020, 8:27 PM

## 2020-01-11 NOTE — ED Notes (Signed)
MD paged to report persistent sinus tach and abd pain. Page returned by Dr. Antionette Char, coming to assess pt.

## 2020-01-11 NOTE — ED Notes (Signed)
Admitting advised this RN to give pt a dose of maalox to see if it would help with pt's chest pain.

## 2020-01-11 NOTE — ED Notes (Signed)
Admitting MD at bedside to evaluate the pt

## 2020-01-11 NOTE — ED Notes (Signed)
RT called- pts sats sitting at 89% on BIPAP. Pt's color initially better after bipap but currently he appears pale and gray. RT to come assess. Awaiting CT results.

## 2020-01-11 NOTE — Progress Notes (Signed)
OT Cancellation Note  Patient Details Name: Cole Fuller MRN: [id number] DOB: 12-Apr-1951   Cancelled Treatment:    Reason Eval/Treat Not Completed: Medical issues which prohibited therapy. Pt currently with HR in 120's and respiratory issues on Bipap. Will follow and eval as appropriate.  Ignacia Palma, OTR/L Acute Rehab Services Pager [phone] Office [phone]     Evette Georges 01/11/2020, 9:01 AM

## 2020-01-11 NOTE — ED Provider Notes (Signed)
  Physical Exam  BP (!) 134/93   Pulse (!) 120   Temp 97.6 F (36.4 C) (Oral)   Resp (!) 29   Ht 6' (1.829 m)   Wt 61.5 kg   SpO2 99%   BMI 18.39 kg/m   Physical Exam  ED Course/Procedures     Procedures  MDM  Patient boarding in the ER.  Had some respiratory decompensation.  Nursing unable to reach hospitalist emergently.  Sats went down to 80s.  Placed on nonrebreather and then placed onto BiPAP.  Tachycardic.  Patient feeling somewhat better on BiPAP.  And hospitalist has not been contacted.       Benjiman Core, MD 01/11/20 [phone]

## 2020-01-11 NOTE — ED Notes (Signed)
Pt found to be sitting straight up in bed hunched over. Pt stating his chest is hurting and he cant breathe. Pt's oxygen saturation was 88% on RA with great wave form. Pt placed on 2L O2 [place]. Pt now sating 97% but still complaining of chest pain. This RN redrew the troponin and captured an EKG.

## 2020-01-12 DIAGNOSIS — I5023 Acute on chronic systolic (congestive) heart failure: Secondary | ICD-10-CM | POA: Diagnosis not present

## 2020-01-12 DIAGNOSIS — R5381 Other malaise: Secondary | ICD-10-CM

## 2020-01-12 DIAGNOSIS — J9601 Acute respiratory failure with hypoxia: Secondary | ICD-10-CM | POA: Diagnosis not present

## 2020-01-12 LAB — BASIC METABOLIC PANEL
Anion gap: 11 (ref 5–15)
BUN: 10 mg/dL (ref 8–23)
CO2: 30 mmol/L (ref 22–32)
Calcium: 9.1 mg/dL (ref 8.9–10.3)
Chloride: 100 mmol/L (ref 98–111)
Creatinine, Ser: 0.76 mg/dL (ref 0.61–1.24)
GFR calc Af Amer: >60 mL/min (ref >60)
GFR calc non Af Amer: >60 mL/min (ref >60)
Glucose, Bld: 125 mg/dL — ABNORMAL HIGH (ref 70–99)
Potassium: 4 mmol/L (ref 3.5–5.1)
Sodium: 141 mmol/L (ref 135–145)

## 2020-01-12 LAB — URINE CULTURE: Culture: NO GROWTH

## 2020-01-12 LAB — CBC WITH DIFFERENTIAL/PLATELET
Abs Immature Granulocytes: 0.01 10*3/uL (ref 0.00–0.07)
Basophils Absolute: 0.1 10*3/uL (ref 0.0–0.1)
Basophils Relative: 1 %
Eosinophils Absolute: 0.1 10*3/uL (ref 0.0–0.5)
Eosinophils Relative: 1 %
HCT: 42.7 % (ref 39.0–52.0)
Hemoglobin: 13.8 g/dL (ref 13.0–17.0)
Immature Granulocytes: 0 %
Lymphocytes Relative: 32 %
Lymphs Abs: 2.4 10*3/uL (ref 0.7–4.0)
MCH: 34.5 pg — ABNORMAL HIGH (ref 26.0–34.0)
MCHC: 32.3 g/dL (ref 30.0–36.0)
MCV: 106.8 fL — ABNORMAL HIGH (ref 80.0–100.0)
Monocytes Absolute: 0.5 10*3/uL (ref 0.1–1.0)
Monocytes Relative: 7 %
Neutro Abs: 4.4 10*3/uL (ref 1.7–7.7)
Neutrophils Relative %: 59 %
Platelets: 191 10*3/uL (ref 150–400)
RBC: 4 MIL/uL — ABNORMAL LOW (ref 4.22–5.81)
RDW: 14.1 % (ref 11.5–15.5)
WBC: 7.3 10*3/uL (ref 4.0–10.5)
nRBC: 0 % (ref 0.0–0.2)

## 2020-01-12 LAB — MAGNESIUM: Magnesium: 2.2 mg/dL (ref 1.7–2.4)

## 2020-01-12 MED ORDER — ALPRAZOLAM 0.5 MG PO TABS
1.0000 mg | ORAL_TABLET | Freq: Four times a day (QID) | ORAL | Status: DC | PRN
  Administered 2020-01-13 – 2020-01-14 (×4): 1 mg via ORAL
  Filled 2020-01-12 (×4): qty 2

## 2020-01-12 NOTE — Evaluation (Signed)
Occupational Therapy Evaluation and Discharge Patient Details Name: Cole Fuller MRN: [id number] DOB: 23-Nov-1950 Today's Date: 01/12/2020    History of Present Illness HARVIS MABUS is a 69 y.o. male with PMH of CAD s/p stent in 2004, HTN, PAD s/p stent , AAA, anxiety, arthritis, tobacco abuse and chronic pain presented to ED with multiple complaints and admitted for deconditioning and PT/OT evaluation.   Clinical Impression   Pt is functioning at a supervision to modified independent level in ADL and ADL transfers. He ambulated on RA with Sp02 remaining >90% throughout. Pt reports having depression and states he is on medication, but does not see much improvement in his mood. No further OT needs. Recommend pt complete ADL with nursing staff.    Follow Up Recommendations  No OT follow up    Equipment Recommendations  None recommended by OT    Recommendations for Other Services       Precautions / Restrictions Precautions Precautions: None Restrictions Weight Bearing Restrictions: No      Mobility Bed Mobility               General bed mobility comments: pt up in the recliner on arrival.  Transfers Overall transfer level: Needs assistance Equipment used: None Transfers: Sit to/from Stand Sit to Stand: Supervision         General transfer comment: for safety/lines    Balance Overall balance assessment: Mild deficits observed, not formally tested                                         ADL either performed or assessed with clinical judgement   ADL Overall ADL's : Modified independent                                             Vision Baseline Vision/History: Wears glasses Wears Glasses: At all times Patient Visual Report: No change from baseline       Perception     Praxis      Pertinent Vitals/Pain Pain Assessment: No/denies pain Faces Pain Scale: No hurt Pain Intervention(s): Monitored during session      Hand Dominance Right   Extremity/Trunk Assessment Upper Extremity Assessment Upper Extremity Assessment: Overall WFL for tasks assessed   Lower Extremity Assessment Lower Extremity Assessment: Defer to PT evaluation       Communication Communication Communication: HOH   Cognition Arousal/Alertness: Awake/alert Behavior During Therapy: WFL for tasks assessed/performed Overall Cognitive Status: Within Functional Limits for tasks assessed                                     General Comments       Exercises     Shoulder Instructions      Home Living Family/patient expects to be discharged to:: Private residence Living Arrangements: Spouse/significant other Available Help at Discharge: Family;Available PRN/intermittently Type of Home: House Home Access: Stairs to enter [employer] of Steps: 3 Entrance Stairs-Rails: Can reach both Home Layout: One level     Bathroom Shower/Tub: Walk-in shower;Tub/shower unit         Home Equipment: Grab bars - tub/shower;Grab bars - toilet;Shower seat  Prior Functioning/Environment Level of Independence: Independent                 OT Problem List:        OT Treatment/Interventions:      OT Goals(Current goals can be found in the care plan section) Acute Rehab OT Goals Patient Stated Goal: go home  OT Frequency:     Barriers to D/C:            Co-evaluation              AM-PAC OT "6 Clicks" Daily Activity     Outcome Measure Help from another person eating meals?: None Help from another person taking care of personal grooming?: None Help from another person toileting, which includes using toliet, bedpan, or urinal?: None Help from another person bathing (including washing, rinsing, drying)?: None Help from another person to put on and taking off regular upper body clothing?: None Help from another person to put on and taking off regular lower body clothing?:  None 6 Click Score: 24   End of Session Nurse Communication: Mobility status  Activity Tolerance: Patient tolerated treatment well Patient left: in chair;with call bell/phone within reach  OT Visit Diagnosis: Muscle weakness (generalized) (M62.81)                Time: 1224-8250 OT Time Calculation (min): 18 min Charges:  OT General Charges $OT Visit: 1 Visit OT Evaluation $OT Eval Low Complexity: 1 Low  Martie Round, OTR/L Acute Rehabilitation Services Pager: [phone] Office: [phone]  Evern Bio 01/12/2020, 1:19 PM

## 2020-01-12 NOTE — Progress Notes (Signed)
Report called to 2W staff. Patient alert and oriented X4 and aware of transfer. No new complaints at this time.  Personal clothing sent with patient. Patient transferring in wheelchair to 2W32.

## 2020-01-12 NOTE — Progress Notes (Signed)
eLink Physician-Brief Progress Note Patient Name: Cole Fuller DOB: [birth year] MRN: [id number]   Date of Service  01/12/2020  HPI/Events of Note  Patient c/o anxiety/pain.  eICU Interventions  Will order: 1. Increase Xanax to 1 mg PO Q 6 hours PRN anxiety.     Intervention Category Major Interventions: Delirium, psychosis, severe agitation - evaluation and management  Marquon Alcala,Avel Eugene 01/12/2020, 8:55 PM

## 2020-01-12 NOTE — Evaluation (Signed)
Physical Therapy Evaluation Patient Details Name: Cole Fuller MRN: [id number] DOB: 1950/12/07 Today's Date: 01/12/2020   History of Present Illness  Cole Fuller is a 69 y.o. male with PMH of CAD s/p stent in 2004, HTN, PAD s/p stent , AAA, anxiety, arthritis, tobacco abuse and chronic pain presented to ED with multiple complaints and admitted for deconditioning and PT/OT evaluation.  Clinical Impression  Pt is close to recent baseline functioning and should be safe at home with available assist. There are no further acute PT needs.  Will sign off at this time.     Follow Up Recommendations No PT follow up    Equipment Recommendations  None recommended by PT    Recommendations for Other Services       Precautions / Restrictions Precautions Precautions: None Restrictions Weight Bearing Restrictions: No      Mobility  Bed Mobility               General bed mobility comments: pt up in the recliner on arrival.  Transfers Overall transfer level: Needs assistance Equipment used: None Transfers: Sit to/from Stand Sit to Stand: Supervision         General transfer comment: for safety/lines  Ambulation/Gait Ambulation/Gait assistance: Supervision Gait Distance (Feet): 200 Feet Assistive device: None Gait Pattern/deviations: Step-through pattern Gait velocity: moderate Gait velocity interpretation: 1.31 - 2.62 ft/sec, indicative of limited community ambulator General Gait Details: minor self-recoverable deviations with moderate cadence, though shorter stride length.  Sats on RA slowly fell through mid 90's to 91% over 15 min.  98% on 4L at rest.  Stairs Stairs: Yes Stairs assistance: Supervision Stair Management: One rail Right;Alternating pattern;Forwards Number of Stairs: 5 General stair comments: safe with rail.  A little unsteady without the rail  Wheelchair Mobility    Modified Rankin (Stroke Patients Only)       Balance Overall balance  assessment: Mild deficits observed, not formally tested Sitting-balance support: Feet supported Sitting balance-Leahy Scale: Good     Standing balance support: During functional activity;No upper extremity supported Standing balance-Leahy Scale: Fair Standing balance comment: Pt can stand statically without LOB. Pt staggers occasionally in hallway needing steadying assist.                               Pertinent Vitals/Pain Pain Assessment: No/denies pain Faces Pain Scale: No hurt Pain Intervention(s): Monitored during session    Home Living Family/patient expects to be discharged to:: Private residence Living Arrangements: Spouse/significant other Available Help at Discharge: Family;Available PRN/intermittently Type of Home: House Home Access: Stairs to enter Entrance Stairs-Rails: Can reach both Entrance Stairs-Number of Steps: 3 Home Layout: One level Home Equipment: Grab bars - tub/shower;Grab bars - toilet;Shower seat      Prior Function Level of Independence: Independent               Hand Dominance   Dominant Hand: Right    Extremity/Trunk Assessment   Upper Extremity Assessment Upper Extremity Assessment: Overall WFL for tasks assessed    Lower Extremity Assessment Lower Extremity Assessment: Defer to PT evaluation       Communication   Communication: HOH  Cognition Arousal/Alertness: Awake/alert Behavior During Therapy: WFL for tasks assessed/performed Overall Cognitive Status: Within Functional Limits for tasks assessed  General Comments      Exercises     Assessment/Plan    PT Assessment Patent does not need any further PT services  PT Problem List Decreased activity tolerance;Cardiopulmonary status limiting activity       PT Treatment Interventions      PT Goals (Current goals can be found in the Care Plan section)  Acute Rehab PT Goals Patient Stated Goal: go  home PT Goal Formulation: All assessment and education complete, DC therapy    Frequency     Barriers to discharge        Co-evaluation               AM-PAC PT "6 Clicks" Mobility  Outcome Measure Help needed turning from your back to your side while in a flat bed without using bedrails?: None Help needed moving from lying on your back to sitting on the side of a flat bed without using bedrails?: None Help needed moving to and from a bed to a chair (including a wheelchair)?: None Help needed standing up from a chair using your arms (e.g., wheelchair or bedside chair)?: None Help needed to walk in hospital room?: None Help needed climbing 3-5 steps with a railing? : A Little 6 Click Score: 23    End of Session   Activity Tolerance: Patient tolerated treatment well Patient left: in chair;with call bell/phone within reach;with chair alarm set Nurse Communication: Mobility status PT Visit Diagnosis: Unsteadiness on feet (R26.81)    Time: 1206-1224 PT Time Calculation (min) (ACUTE ONLY): 18 min   Charges:   PT Evaluation $PT Eval Low Complexity: 1 Low          01/12/2020  Jacinto Halim., PT Acute Rehabilitation Services [phone]  (pager) [phone]  (office)  Cole Fuller 01/12/2020, 3:22 PM

## 2020-01-12 NOTE — Progress Notes (Signed)
NAME:  Cole Fuller, MRN:  [id number], DOB:  1950-12-17, LOS: 1 ADMISSION DATE:  01/10/2020, CONSULTATION DATE:  5/4 REFERRING MD:  Dr. Morene Antu TRH, CHIEF COMPLAINT:  Hypoxia   Brief History   69 year old male with recent dx of severe HFrEF presented to [facility] 5/3 admitted for abdominal pain. Decompensated from a resp standpoint in the morning hours of 5/4 prompting BiPAP initiation. PCCM consulted.   History of present illness   69 year old male with PMH as below, which is significant for  CAD, HTN, and NSTEMI. He was recently admitted 4/8-4/13 initially for pneumonia, but on further evaluation was found to have a new LVEF 15-20%. He underwent cardiac catheterization, which demonstrated some coronary disease, but cardiomyopathy was far out of proportion. Etiology remained unclear and medical therapy was recommended by cardiology including carvediolol and losartan. BP would not tolerate entresto at that time. He was discharged 4/13. Since discharge, wife reports that he has been declining physically and mentally.   He again presented to [facility] ED on 5/3 with complaints of abd pain, SOB, and overall decline. Complained of orthopnea. Abdominal pain has persisted x months and is located in the lower abdomen. On examination in the ED, he had significant work of breathing and had crackles on exam. Exam was felt to be consistent with CHF exacerbation. The patient was treated with lasix with improvement and was admitted to the hospitalist service. Then in the morning hours of 5/4 he developed hypoxia and respiratory extremis. He was placed on NRB and ultimately required BiPAP, with which he improved. He was given an additional dose of lasix and began to have urinary output. PCCM consulted for further management.   Past Medical History   has a past medical history of Acute metabolic encephalopathy (12/16/2019), Anxiety, Arthritis, Arthritis pain, Back pain, Bruises easily, CHF (congestive heart failure)  (HCC), Chills, Coronary artery disease, Hypertension, Inguinal hernia, Inguinal hernia, NSTEMI (non-ST elevated myocardial infarction) (HCC) (03/16/2016), Peripheral vascular disease (HCC), and Postop check (02/18/2012).   Significant Hospital Events   5/3 admit for CHF 5/4 transfer to ICU for BiPAP after respiratory decompensation.   Consults:  PCCM  Procedures:    Significant Diagnostic Tests:  CTA chest 5/4 > No evidence of pulmonary embolism. Marked cardiomegaly with findings most suggestive of pulmonary edema. Marked cardiomegaly with small to moderate-sized pericardial effusion. Nonocclusive debris within the carina with near occlusive debris within the bronchus intermedius. Mediastinal lymphadenopathy.  Micro Data:  COVID/Flu swab 5/4 Blood 5/4 > Urine 5/4 >  Antimicrobials:    Interim history/subjective:   No acute distress.  He is a DNR.  We will transfer out of the intensive care unit back to [facility] with hospice following Objective   Blood pressure 107/69, pulse 86, temperature 98.5 F (36.9 C), temperature source Oral, resp. rate (!) 27, height 6' (1.829 m), weight 68.8 kg, SpO2 97 %.    FiO2 (%):  [40 %] 40 %   Intake/Output Summary (Last 24 hours) at 01/12/2020 1020 Last data filed at 01/12/2020 0800 Gross per 24 hour  Intake 580 ml  Output 2025 ml  Net -1445 ml   Filed Weights   01/11/20 0000 01/12/20 0323  Weight: 61.5 kg 68.8 kg    Examination: General: Frail elderly male in no acute distress at rest HEENT: Pupils equal reactive to light. Neuro: Grossly intact  CV: Heart sounds are distant PULM: Decreased breath sounds throughout GI: soft, bsx4 active  Extremities: Lower  extremity venous stasis is noted mild Skin: no rashes or lesions   Resolved Hospital Problem list     Assessment & Plan:   Acute hypoxemic and hypercarbic respiratory failure: history and presentation consistent with pulmonary edema. CT does describe some debris  at the carina and bronchus intermedius, which would raise concern for aspiration.  Decrease sedation Wean FiO2 DNR status therefore ventilatory support should not be an option Palliative care following Transfer out of the intensive care unit back to [facility] service  Acute on chronic HFrEF: LVEF 15-20% on recent admission.  Continue home medical regiment Questionable cardiology involvement in future  Abdominal pain: improved now that work of breathing is better. Non-tender. Worked up with CT scan recently which was negative.  Advance diet as tolerated  Acute metabolic encephalopathy: hypoxia, hypercarbia. Resolved with decreasing sedating medications We have added back benzos avoid withdrawal We will need to walk a fine line between angiolytic and oversedation.  AAA:  follow-up ultrasound in 2022  Deconditioning: Palliative care following appreciate their input   Best practice:  Diet: NPO Pain/Anxiety/Delirium protocol (if indicated): NA VAP protocol (if indicated): NA DVT prophylaxis: Lovenox GI prophylaxis: NA Glucose control: SSI Mobility: BR Code Status: DNR per Palliative conversation. Wife confirmed.  Family Communication: Family updated daily Disposition: ICU will transition out of the intensive care unit and back to [facility] service  Labs   CBC: Recent Labs  Lab 01/10/20 1638 01/11/20 0820 01/11/20 0828 01/12/20 0423  WBC 6.1 10.9*  --  7.3  NEUTROABS  --  7.5  --  4.4  HGB 13.4 16.0 15.6 13.8  HCT 41.3 51.0 46.0 42.7  MCV 104.8* 107.8*  --  106.8*  PLT 210 244  --  191    Basic Metabolic Panel: Recent Labs  Lab 01/10/20 1638 01/10/20 2351 01/11/20 0447 01/11/20 0820 01/11/20 0828 01/12/20 0423  NA 140  --   --  142 140 141  K 3.7  --   --  4.0 4.2 4.0  CL 101  --   --  104  --  100  CO2 30  --   --  23  --  30  GLUCOSE 113*  --   --  116*  --  125*  BUN 5*  --   --  5*  --  10  CREATININE 0.83  --   --  0.79  --  0.76    CALCIUM 9.1  --   --  9.3  --  9.1  MG  --  2.1 1.9  --   --  2.2   GFR: Estimated Creatinine Clearance: 84.8 mL/min (by C-G formula based on SCr of 0.76 mg/dL). Recent Labs  Lab 01/10/20 1638 01/11/20 0820 01/12/20 0423  WBC 6.1 10.9* 7.3    Liver Function Tests: No results for input(s): AST, ALT, ALKPHOS, BILITOT, PROT, ALBUMIN in the last 168 hours. No results for input(s): LIPASE, AMYLASE in the last 168 hours. No results for input(s): AMMONIA in the last 168 hours.  ABG    Component Value Date/Time   PHART 7.283 (L) 01/11/2020 0828   PCO2ART 62.9 (H) 01/11/2020 0828   PO2ART 101 01/11/2020 0828   HCO3 29.7 (H) 01/11/2020 0828   TCO2 32 01/11/2020 0828   O2SAT 97.0 01/11/2020 0828     Coagulation Profile: No results for input(s): INR, PROTIME in the last 168 hours.  Cardiac Enzymes: No results for input(s): CKTOTAL, CKMB, CKMBINDEX, TROPONINI in the last 168 hours.  HbA1C: Hgb A1c MFr Bld  Date/Time Value Ref Range Status  03/16/2016 11:20 PM 5.5 4.8 - 5.6 % Final    Comment:    (NOTE)         Pre-diabetes: 5.7 - 6.4         Diabetes: >6.4         Glycemic control for adults with diabetes: <7.0     CBG: Recent Labs  Lab 01/11/20 1107  [place]*      Critical care time:     Richardson Landry Kylena Mole ACNP Acute Care Nurse Practitioner Maryanna Shape Pulmonary/Critical Care Please consult [place] 01/12/2020, 10:20 AM

## 2020-01-13 LAB — BASIC METABOLIC PANEL
Anion gap: 9 (ref 5–15)
BUN: 9 mg/dL (ref 8–23)
CO2: 29 mmol/L (ref 22–32)
Calcium: 8.9 mg/dL (ref 8.9–10.3)
Chloride: 101 mmol/L (ref 98–111)
Creatinine, Ser: 0.68 mg/dL (ref 0.61–1.24)
GFR calc Af Amer: >60 mL/min (ref >60)
GFR calc non Af Amer: >60 mL/min (ref >60)
Glucose, Bld: 102 mg/dL — ABNORMAL HIGH (ref 70–99)
Potassium: 3.7 mmol/L (ref 3.5–5.1)
Sodium: 139 mmol/L (ref 135–145)

## 2020-01-13 LAB — CBC WITH DIFFERENTIAL/PLATELET
Abs Immature Granulocytes: 0.02 10*3/uL (ref 0.00–0.07)
Basophils Absolute: 0.1 10*3/uL (ref 0.0–0.1)
Basophils Relative: 1 %
Eosinophils Absolute: 0.1 10*3/uL (ref 0.0–0.5)
Eosinophils Relative: 1 %
HCT: 39.9 % (ref 39.0–52.0)
Hemoglobin: 12.8 g/dL — ABNORMAL LOW (ref 13.0–17.0)
Immature Granulocytes: 0 %
Lymphocytes Relative: 33 %
Lymphs Abs: 2.3 10*3/uL (ref 0.7–4.0)
MCH: 34 pg (ref 26.0–34.0)
MCHC: 32.1 g/dL (ref 30.0–36.0)
MCV: 105.8 fL — ABNORMAL HIGH (ref 80.0–100.0)
Monocytes Absolute: 0.5 10*3/uL (ref 0.1–1.0)
Monocytes Relative: 7 %
Neutro Abs: 4 10*3/uL (ref 1.7–7.7)
Neutrophils Relative %: 58 %
Platelets: 171 10*3/uL (ref 150–400)
RBC: 3.77 MIL/uL — ABNORMAL LOW (ref 4.22–5.81)
RDW: 13.9 % (ref 11.5–15.5)
WBC: 6.9 10*3/uL (ref 4.0–10.5)
nRBC: 0 % (ref 0.0–0.2)

## 2020-01-13 LAB — PHOSPHORUS: Phosphorus: 3.2 mg/dL (ref 2.5–4.6)

## 2020-01-13 LAB — MAGNESIUM: Magnesium: 1.9 mg/dL (ref 1.7–2.4)

## 2020-01-13 MED ORDER — FUROSEMIDE 20 MG PO TABS
20.0000 mg | ORAL_TABLET | Freq: Every day | ORAL | Status: DC
  Administered 2020-01-13 – 2020-01-14 (×2): 20 mg via ORAL
  Filled 2020-01-13 (×2): qty 1

## 2020-01-13 NOTE — Progress Notes (Signed)
PROGRESS NOTE    Cole Fuller  ASN:[id number] DOB: 06/15/1951 DOA: 01/10/2020 PCP: Elwyn Reach, MD     Brief Narrative:  Cole Fuller is a 69 y.o. male with PMH of CADs/pstent in2004,HTN, PADs/p stent,AAA,anxiety, arthritis,tobacco abuseand chronic painpresented to ED with multiple complaints and admitted for deconditioning and PT/OT evaluation.  At the time of admission, patient had complained about several week duration of abdominal pain as well as chronic hip pain.  On 5/3-5/4, patient decompensated in his respiratory status and he was placed on BiPAP with ICU consultation.  He was treated with IV diuresis for cardiogenic pulmonary edema.  Palliative care was consulted.  Patient was weaned off BiPAP, to nasal cannula O2 with improvement in his respiratory status.  He was transferred back to [facility] service on 5/6.  New events last 24 hours / Subjective: Remains on 5 L nasal cannula this morning.  Denies any shortness of breath.  Wants to go home.  Assessment & Plan:   Principal Problem:   Physical deconditioning Active Problems:   HTN (hypertension)   Acute exacerbation of CHF (congestive heart failure) (HCC)   Chronic pain syndrome   Prolonged QT interval   Adjustment disorder with depressed mood   Abdominal pain   Acute on chronic HFrEF (heart failure with reduced ejection fraction) (HCC)   Acute respiratory failure with hypoxia (HCC)    Acute hypoxemic and hypercapnic respiratory failure in setting of bilateral pulmonary edema, acute on chronic systolic heart failure -CTA chest: Marked cardiomegaly with findings most suggestive of pulmonary edema with small right and trace left-sided pleural effusions and diffuse though apical predominant interseptal thickening. -Echocardiogram 12/17/19 showed EF 15-20% with global hypokinesis  -Weaned off BiPAP and currently remains on 5 L nasal cannula O2. Home desat screen  -Continue Lasix, Losartan, Coreg -Needs to follow  up with Cardiologist Dr. Margaretann Loveless as outpatient (no show appointment on 4/23)   Acute on chronic metabolic encephalopathy -Seems to be back to his baseline  CAD -Continue aspirin, crestor   BPH -Continue flomax   AAA -CT A/P: 4 cm infrarenal abdominal aortic aneurysm is noted. Recommend followup by [place] in 1 year -Follow up outpatient  Deconditioning -PT OT eval without acute needs    DVT prophylaxis: Lovenox  Code Status: DNR Family Communication: Wife at bedside Disposition Plan:   Status is: Inpatient  Remains inpatient appropriate because:Inpatient level of care appropriate due to severity of illness   Dispo: The patient is from: Home              Anticipated d/c is to: Home              Anticipated d/c date is: 1 day              Patient currently is not medically stable to d/c. Continue to wean [place] O2 as able   Consultants:   PCCM  Palliative care medicine   Antimicrobials:  Anti-infectives (From admission, onward)   None        Objective: Vitals:   01/13/20 0900 01/13/20 1100 01/13/20 1200 01/13/20 1224  BP:      Pulse:      Resp:      Temp:      TempSrc:      SpO2: 99% 98% 97% 95%  Weight:      Height:        Intake/Output Summary (Last 24 hours) at 01/13/2020 1241 Last data filed at 01/13/2020 1937 Gross per 24  hour  Intake 240 ml  Output 900 ml  Net -660 ml   Filed Weights   01/11/20 0000 01/12/20 0323  Weight: 61.5 kg 68.8 kg    Examination:  General exam: Appears calm and comfortable  Respiratory system: Clear to auscultation. Respiratory effort normal. No respiratory distress. No conversational dyspnea. On 5L [place] O2 Cardiovascular system: S1 & S2 heard, RRR. No murmurs. No pedal edema. Gastrointestinal system: Abdomen is nondistended, soft and nontender. Normal bowel sounds heard. Central nervous system: Alert. No focal neurological deficits. Speech clear.  Extremities: Symmetric in appearance  Skin: No rashes, lesions or ulcers on  exposed skin  Psychiatry: Stable   Data Reviewed: I have personally reviewed following labs and imaging studies  CBC: Recent Labs  Lab 01/10/20 1638 01/11/20 0820 01/11/20 0828 01/12/20 0423 01/13/20 0341  WBC 6.1 10.9*  --  7.3 6.9  NEUTROABS  --  7.5  --  4.4 4.0  HGB 13.4 16.0 15.6 13.8 12.8*  HCT 41.3 51.0 46.0 42.7 39.9  MCV 104.8* 107.8*  --  106.8* 105.8*  PLT 210 244  --  191 171   Basic Metabolic Panel: Recent Labs  Lab 01/10/20 1638 01/10/20 2351 01/11/20 0447 01/11/20 0820 01/11/20 0828 01/12/20 0423 01/13/20 0341  NA 140  --   --  142 140 141 139  K 3.7  --   --  4.0 4.2 4.0 3.7  CL 101  --   --  104  --  100 101  CO2 30  --   --  23  --  30 29  GLUCOSE 113*  --   --  116*  --  125* 102*  BUN 5*  --   --  5*  --  10 9  CREATININE 0.83  --   --  0.79  --  0.76 0.68  CALCIUM 9.1  --   --  9.3  --  9.1 8.9  MG  --  2.1 1.9  --   --  2.2 1.9  PHOS  --   --   --   --   --   --  3.2   GFR: Estimated Creatinine Clearance: 84.8 mL/min (by C-G formula based on SCr of 0.68 mg/dL). Liver Function Tests: No results for input(s): AST, ALT, ALKPHOS, BILITOT, PROT, ALBUMIN in the last 168 hours. No results for input(s): LIPASE, AMYLASE in the last 168 hours. No results for input(s): AMMONIA in the last 168 hours. Coagulation Profile: No results for input(s): INR, PROTIME in the last 168 hours. Cardiac Enzymes: No results for input(s): CKTOTAL, CKMB, CKMBINDEX, TROPONINI in the last 168 hours. BNP (last 3 results) No results for input(s): PROBNP in the last 8760 hours. HbA1C: No results for input(s): HGBA1C in the last 72 hours. CBG: Recent Labs  Lab 01/11/20 1107  GLUCAP 108*   Lipid Profile: No results for input(s): CHOL, HDL, LDLCALC, TRIG, CHOLHDL, LDLDIRECT in the last 72 hours. Thyroid Function Tests: No results for input(s): TSH, T4TOTAL, FREET4, T3FREE, THYROIDAB in the last 72 hours. Anemia Panel: No results for input(s): VITAMINB12, FOLATE,  FERRITIN, TIBC, IRON, RETICCTPCT in the last 72 hours. Sepsis Labs: No results for input(s): PROCALCITON, LATICACIDVEN in the last 168 hours.  Recent Results (from the past 240 hour(s))  Respiratory Panel by RT PCR (Flu A&B, Covid) - Nasopharyngeal Swab     Status: None   Collection Time: 01/10/20 11:05 PM   Specimen: Nasopharyngeal Swab  Result Value Ref Range Status  SARS Coronavirus 2 by RT PCR NEGATIVE NEGATIVE Final    Comment: (NOTE) SARS-CoV-2 target nucleic acids are NOT DETECTED. The SARS-CoV-2 RNA is generally detectable in upper respiratoy specimens during the acute phase of infection. The lowest concentration of SARS-CoV-2 viral copies this assay can detect is 131 copies/mL. A negative result does not preclude SARS-Cov-2 infection and should not be used as the sole basis for treatment or other patient management decisions. A negative result may occur with  improper specimen collection/handling, submission of specimen other than nasopharyngeal swab, presence of viral mutation(s) within the areas targeted by this assay, and inadequate number of viral copies (<131 copies/mL). A negative result must be combined with clinical observations, patient history, and epidemiological information. The expected result is Negative. Fact Sheet for Patients:  https://www.moore.com/ Fact Sheet for Healthcare Providers:  https://www.young.biz/ This test is not yet ap proved or cleared by the [place] FDA and  has been authorized for detection and/or diagnosis of SARS-CoV-2 by FDA under an Emergency Use Authorization (EUA). This EUA will remain  in effect (meaning this test can be used) for the duration of the COVID-19 declaration under Section 564(b)(1) of the Act, 21 U.S.C. section 360bbb-3(b)(1), unless the authorization is terminated or revoked sooner.    Influenza A by PCR NEGATIVE NEGATIVE Final   Influenza B by PCR NEGATIVE NEGATIVE Final     Comment: (NOTE) The Xpert Xpress SARS-CoV-2/FLU/RSV assay is intended as an aid in  the diagnosis of influenza from Nasopharyngeal swab specimens and  should not be used as a sole basis for treatment. Nasal washings and  aspirates are unacceptable for Xpert Xpress SARS-CoV-2/FLU/RSV  testing. Fact Sheet for Patients: https://www.moore.com/ Fact Sheet for Healthcare Providers: https://www.young.biz/ This test is not yet approved or cleared by the [place] FDA and  has been authorized for detection and/or diagnosis of SARS-CoV-2 by  FDA under an Emergency Use Authorization (EUA). This EUA will remain  in effect (meaning this test can be used) for the duration of the  Covid-19 declaration under Section 564(b)(1) of the Act, 21  U.S.C. section 360bbb-3(b)(1), unless the authorization is  terminated or revoked. Performed at [facility], [address], [place], [place] [address]   Culture, blood (routine x 2)     Status: None (Preliminary result)   Collection Time: 01/11/20  9:46 AM   Specimen: BLOOD RIGHT HAND  Result Value Ref Range Status   Specimen Description BLOOD RIGHT HAND  Final   Special Requests   Final    BOTTLES DRAWN AEROBIC AND ANAEROBIC Blood Culture adequate volume   Culture   Final    NO GROWTH 2 DAYS Performed at [facility], [address], [place], [place] [address]    Report Status PENDING  Incomplete  Culture, Urine     Status: None   Collection Time: 01/11/20 10:06 AM   Specimen: Urine, Random  Result Value Ref Range Status   Specimen Description URINE, RANDOM  Final   Special Requests NONE  Final   Culture   Final    NO GROWTH Performed at [facility], [address], [place], [place] [address]    Report Status 01/12/2020 FINAL  Final  Culture, blood (routine x 2)     Status: None (Preliminary result)   Collection Time: 01/11/20 10:06 AM   Specimen: BLOOD LEFT HAND  Result Value Ref  Range Status   Specimen Description BLOOD LEFT HAND  Final   Special Requests  Final    BOTTLES DRAWN AEROBIC ONLY Blood Culture results may not be optimal due to an inadequate volume of blood received in culture bottles   Culture   Final    NO GROWTH 2 DAYS Performed at [facility], [address], [place], [place] [address]    Report Status PENDING  Incomplete  MRSA PCR Screening     Status: None   Collection Time: 01/11/20 11:10 AM   Specimen: Nasopharyngeal  Result Value Ref Range Status   MRSA by PCR NEGATIVE NEGATIVE Final    Comment:        The GeneXpert MRSA Assay (FDA approved for NASAL specimens only), is one component of a comprehensive MRSA colonization surveillance program. It is not intended to diagnose MRSA infection nor to guide or monitor treatment for MRSA infections. Performed at [facility], [address], [place], [place] [address]       Radiology Studies: No results found.    Scheduled Meds: . aspirin  81 mg Oral Daily  . budesonide (PULMICORT) nebulizer solution  0.25 mg Nebulization BID  . carvedilol  3.125 mg Oral BID WC  . Chlorhexidine Gluconate Cloth  6 each Topical Daily  . enoxaparin (LOVENOX) injection  40 mg Subcutaneous Q24H  . losartan  12.5 mg Oral Daily  . mouth rinse  15 mL Mouth Rinse BID  . rosuvastatin  40 mg Oral q1800  . tamsulosin  0.4 mg Oral Daily   Continuous Infusions: . sodium chloride Stopped (01/12/20 0415)     LOS: 2 days      Time spent: 35 minutes   Noralee Stain, DO [facility] 01/13/2020, 12:41 PM   Available via Epic secure chat 7am-7pm After these hours, please refer to coverage provider listed on [email or web address]

## 2020-01-13 NOTE — Progress Notes (Signed)
SATURATION QUALIFICATIONS: (This note is used to comply with regulatory documentation for home oxygen)  Patient Saturations on Room Air at Rest = 97%  Patient Saturations on Room Air while Ambulating = 90%  

## 2020-01-13 NOTE — Progress Notes (Signed)
Patient ID: Cole Fuller, male   DOB: 1950/10/03, 69 y.o.   MRN: [id number]  This NP visited patient at the bedside as a follow up for palliative medicine needs and emotional support, for scheduled meeting with patient and his wife for continued discussion/education regarding current medical situation; diagnosis, prognosis, treatment option decisions,  GOCs, EOL wishes   Patient was initially seen by PMT on 5-4-2.  I introduced myself to patient and his wife as a provider with the palliative medicine team.  Education offered on the natural trajectory and expectation of end stage heart disease/CHF.  The patient's EF is 15 to 20% on last echo.  He recognizes significant physical and functional decline over the last many months.  However he verbalizes how "hard this is to take in" and that he felt "totally fine" only a few months ago.  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  Education offered on the importance of smoking cessation Education offered on the difference between aggressive medical intervention path and a palliative comfort path for this patient at this time in this situation.  We discussed hospice benefit and eligibility.  At this time patient is clear regarding the next steps in his plan of care.  Plan of care -DNR/DNI -No artificial feeding or hydration now or in the future -Declines hospice benefit at this time -Home with home health and palliative services (patient was anxious to discharge today but agrees with attending to stay for 1 more day to further stabilize prior to discharge) -Follow-up with cardiology outpatient  Educated on the importance of continued conversation with each other and their  medical providers regarding overall plan of care and treatment options,  ensuring decisions are within the context of the patients values and GOCs.  Questions  and concerns addressed   Discussed with Dr Alvino Chapel  Total time spent on the unit was 35 minutes  Greater than 50% of the time was spent in counseling and coordination of care  Lorinda Creed NP  Palliative Medicine Team Team Phone # [phone] Pager [phone]

## 2020-01-14 ENCOUNTER — Telehealth: Payer: Self-pay

## 2020-01-14 NOTE — Progress Notes (Signed)
New order to discharge patient home.  AVS completed, printed and reviewed with patient and wife.  All questions answered.  AVS given to wife to take home.  IVs discontinued.  Patient transported to main entrance via wheelchair.  Wife to transport patient home in personal vehicle.

## 2020-01-14 NOTE — Telephone Encounter (Signed)
Palliative Medicine RN Note: Rec'd a call from Mrs Drost. Mr Gelpi is having shortness of breath and does not want to come back to the hospital. He understands that he cannot get O2 without hospice services (did not meet MCR criteria while inpt), and based on not wanting to come back to the hospital and wanting O2, they would like to start hospice services in the home. I could hear both Mr Ealy and his son in the background, who agree with the decision.  They do not have a hospice preference. I obtained an order for hospice from Lorinda Creed, NP. Patient will need to have O2 delivered today. His discharge orders include Xanax and Percocet, so he has both an opioid and a benzo in the home to help manage SOB overnight.  I encouraged his wife to have him take one of his Xanax if it's time to help curb his current SOB. I explained that it can lessen the feelings of anxiety and help break the cycle of anxiety that contributes to the feeling of not being able to breathe.  I reached out to [facility] to see if they can accommodate a same-day admit; they are reviewing the chart/staffing/equipment availability and will call me back.  Rec'd a call back from [facility]; they will be able to get the O2 out tonight and get Mr [facility] admitted. I called Mrs Reamy back to let her know & faxed a signed referral from our NP [facility] to their office.  Margret Chance Toneka Fullen, RN, BSN, [facility] Palliative Medicine Team 01/14/2020 2:44 PM Office [phone]

## 2020-01-14 NOTE — Discharge Summary (Signed)
Physician Discharge Summary  Cole Fuller HER:[id number] DOB: 04-13-1951 DOA: 01/10/2020  PCP: Rometta Emery, MD  Admit date: 01/10/2020 Discharge date: 01/14/2020  Admitted From: Home Disposition:  Home with home health   Recommendations for Outpatient Follow-up:  1. Follow up with PCP in 1 week 2. Follow up with Cardiology as scheduled on 01/28/20 3. Follow up with outpatient palliative care   Discharge Condition: Stable CODE STATUS: DNR Diet recommendation:  Diet Orders (From admission, onward)    Start     Ordered   01/14/20 0000  Diet - low sodium heart healthy     01/14/20 1009   01/11/20 0033  Diet 2 gram sodium Room service appropriate? Yes; Fluid consistency: Thin  Diet effective now    Question Answer Comment  Room service appropriate? Yes   Fluid consistency: Thin      01/11/20 0033         Brief/Interim Summary: Cole Fuller is a 69 y.o.malewithwith PMH ofCADs/pstent in2004,HTN, PADs/p stent,AAA,anxiety, arthritis,tobacco abuseand chronic painpresented to ED with multiple complaints and admitted for deconditioning and PT/OT evaluation.  At the time of admission, patient had complained about several week duration of abdominal pain as well as chronic hip pain.  On 5/3-5/4, patient decompensated in his respiratory status and he was placed on BiPAP with ICU consultation.  He was treated with IV diuresis for cardiogenic pulmonary edema.  Palliative care was consulted.  Patient was weaned off BiPAP, to nasal cannula O2 with improvement in his respiratory status.  He was transferred back to [facility] service on 5/6.  Patient continued to improve clinically.  Patient declined home hospice at this point.  He was weaned off oxygen and passed home oxygen desaturation screening.  Discharge Diagnoses:  Principal Problem:   Physical deconditioning Active Problems:   HTN (hypertension)   Acute exacerbation of CHF (congestive heart failure) (HCC)   Chronic pain syndrome    Prolonged QT interval   Adjustment disorder with depressed mood   Abdominal pain   Acute on chronic HFrEF (heart failure with reduced ejection fraction) (HCC)   Acute respiratory failure with hypoxia (HCC)   Acute hypoxemic and hypercapnic respiratory failure in setting of bilateral pulmonary edema, acute on chronic systolic heart failure -CTA chest: Marked cardiomegaly with findings most suggestive of pulmonary edema with small right and trace left-sided pleural effusions and diffuse though apical predominant interseptal thickening. -Echocardiogram 12/17/19 showed EF 15-20% with global hypokinesis  -Weaned off BiPAP and oxygen -Continue Lasix, Losartan, Coreg -Follow-up with cardiology as an outpatient  Acute on chronic metabolic encephalopathy -Now back to his baseline  CAD -Continue aspirin, crestor   BPH -Continue flomax   AAA -CT A/P: 4 cm infrarenal abdominal aortic aneurysm is noted. Recommend followup by [place] in 1 year -Follow up outpatient  Deconditioning -PT OT eval    Discharge Instructions  Discharge Instructions    (HEART FAILURE PATIENTS) Call MD:  Anytime you have any of the following symptoms: 1) 3 pound weight gain in 24 hours or 5 pounds in 1 week 2) shortness of breath, with or without a dry hacking cough 3) swelling in the hands, feet or stomach 4) if you have to sleep on extra pillows at night in order to breathe.   Complete by: As directed    Call MD for:  difficulty breathing, headache or visual disturbances   Complete by: As directed    Call MD for:  extreme fatigue   Complete by: As directed  Call MD for:  persistant dizziness or light-headedness   Complete by: As directed    Call MD for:  persistant nausea and vomiting   Complete by: As directed    Call MD for:  severe uncontrolled pain   Complete by: As directed    Call MD for:  temperature >100.4   Complete by: As directed    Diet - low sodium heart healthy   Complete by: As directed     Discharge instructions   Complete by: As directed    You were cared for by a hospitalist during your hospital stay. If you have any questions about your discharge medications or the care you received while you were in the hospital after you are discharged, you can call the unit and ask to speak with the hospitalist on call if the hospitalist that took care of you is not available. Once you are discharged, your primary care physician will handle any further medical issues. Please note that NO REFILLS for any discharge medications will be authorized once you are discharged, as it is imperative that you return to your primary care physician (or establish a relationship with a primary care physician if you do not have one) for your aftercare needs so that they can reassess your need for medications and monitor your lab values.   Increase activity slowly   Complete by: As directed      Allergies as of 01/14/2020      Reactions   Benicar Hct [olmesartan Medoxomil-hctz] Shortness Of Breath   Butrans [buprenorphine] Diarrhea, Nausea And Vomiting   Nadolol Shortness Of Breath   Morphine And Related Hives, Itching   Prednisone Other (See Comments)   Chest and throat pain      Medication List    TAKE these medications   alprazolam 2 MG tablet Commonly known as: XANAX Take 2 mg by mouth 3 (three) times daily.   ALPRAZolam 1 MG tablet Commonly known as: XANAX Take 1 tablet (1 mg total) by mouth 3 (three) times daily as needed for anxiety.   aspirin 81 MG chewable tablet Chew 1 tablet (81 mg total) by mouth daily.   carvedilol 3.125 MG tablet Commonly known as: COREG Take 1 tablet (3.125 mg total) by mouth 2 (two) times daily with a meal.   furosemide 20 MG tablet Commonly known as: LASIX Take 1 tablet (20 mg total) by mouth daily.   losartan 25 MG tablet Commonly known as: COZAAR Take 0.5 tablets (12.5 mg total) by mouth daily. What changed: how much to take   nicotine 21 mg/24hr  patch Commonly known as: NICODERM CQ - dosed in mg/24 hours Place 1 patch (21 mg total) onto the skin daily. What changed:   when to take this  reasons to take this   oxyCODONE-acetaminophen 10-325 MG tablet Commonly known as: PERCOCET Take 1 tablet by mouth 3 (three) times daily as needed for pain.   pantoprazole 40 MG tablet Commonly known as: PROTONIX Take 1 tablet (40 mg total) by mouth daily at 6 (six) AM.   rosuvastatin 40 MG tablet Commonly known as: CRESTOR Take 1 tablet (40 mg total) by mouth daily at 6 PM.   tamsulosin 0.4 MG Caps capsule Commonly known as: FLOMAX Take 0.4 mg by mouth daily.   traZODone 100 MG tablet Commonly known as: DESYREL Take 1 tablet (100 mg total) by mouth at bedtime.      Follow-up Information    Rometta Emery, MD. Schedule an appointment  as soon as possible for a visit in 1 week(s).   Specialty: Internal Medicine Contact information: [address] [place] [address] [phone]          Allergies  Allergen Reactions  . Benicar Hct [Olmesartan Medoxomil-Hctz] Shortness Of Breath  . Butrans [Buprenorphine] Diarrhea and Nausea And Vomiting  . Nadolol Shortness Of Breath  . Morphine And Related Hives and Itching  . Prednisone Other (See Comments)    Chest and throat pain    Consultations:  PCCM  Palliative care medicine   Procedures/Studies: DG Chest 2 View  Result Date: 01/10/2020 CLINICAL DATA:  Shortness of breath EXAM: CHEST - 2 VIEW COMPARISON:  12/19/2019 FINDINGS: Cardiomegaly. There is hyperinflation of the lungs compatible with COPD. Small right pleural effusion with right base atelectasis. No confluent opacity on the left. No overt edema or acute bony abnormality. IMPRESSION: COPD, cardiomegaly. Small right pleural effusion with right base atelectasis. Electronically Signed   By: Charlett Nose M.D.   On: 01/10/2020 17:10   CT ANGIO CHEST PE W OR WO CONTRAST  Result Date: 01/11/2020 CLINICAL DATA:   History of COPD now with shortness of breath and decreased oxygen saturation. Evaluate for pulmonary embolism. EXAM: CT ANGIOGRAPHY CHEST WITH CONTRAST TECHNIQUE: Multidetector CT imaging of the chest was performed using the standard protocol during bolus administration of intravenous contrast. Multiplanar CT image reconstructions and MIPs were obtained to evaluate the vascular anatomy. CONTRAST:  67mL OMNIPAQUE IOHEXOL 350 MG/ML SOLN COMPARISON:  CT abdomen and pelvis - 12/17/2019 FINDINGS: Vascular Findings: There is adequate opacification of the pulmonary arterial system with the main pulmonary artery measuring 368 Hounsfield units. There are no discrete filling defects within the pulmonary arterial tree to suggest pulmonary embolism. Normal caliber of the main pulmonary artery. Marked cardiomegaly with grossly unchanged moderate-sized pericardial effusion, most conspicuous about the right atrium. Coronary artery calcifications. Mild fusiform ectasia of the ascending thoracic aorta measuring 38 mm in diameter (image 70, series 6). Moderate amount of scattered predominantly calcified atherosclerotic plaque throughout the thoracic aorta. Conventional configuration of the aortic arch. The patency of the branch vessels of the aortic arch are not evaluated on this examination due to contrast bolus timing. Review of the MIP images confirms the above findings. ---------------------------------------------------------------------------------- Nonvascular Findings: Mediastinum/Lymph Nodes: Mediastinal lymphadenopathy with index AP window lymph node measuring 1.3 cm in greatest short axis diameter (image 49, series 6, index precarinal lymph node measuring 1.4 cm (58, series 6 and index subcarinal nodal conglomeration measuring 1.6 cm (image 72), presumably reactive in etiology. No hilar or axillary lymph adenopathy. Lungs/Pleura: Small right and trace left-sided pleural effusions. Diffuse interseptal wall thickening most  conspicuous within the bilateral upper lobes, right greater than left. Bibasilar heterogeneous/consolidative opacities, more conspicuous within the right lower lobe with associated partial atelectasis/collapse of the right lower lobe. There is a minimal amount of nonocclusive debris seen within the distal aspect of the tracheal air column at the level of the carina (image 61, series 5) with short-segment near occlusion of the bronchus intermedius (image 77, series 7). Mild diffuse centralized bronchial wall thickening. No pneumothorax. Evaluation for discrete pulmonary nodules is degraded secondary to parenchymal abnormalities. Upper abdomen: Limited noncontrast evaluation of the upper abdomen suggests mild nodularity of the hepatic contour. Musculoskeletal: No acute or aggressive osseous abnormalities. Mild (approximately 25%) compression deformity involving the L1 vertebral body, similar to abdominal CT performed 12/17/2019 and without associated fracture line or paraspinal hematoma. Regional soft tissues appear normal. Normal appearance of the  thyroid gland. IMPRESSION: 1. No evidence of pulmonary embolism. 2. Marked cardiomegaly with findings most suggestive of pulmonary edema with small right and trace left-sided pleural effusions and diffuse though apical predominant interseptal thickening. 3. Subpleural ground-glass opacities with relative areas of confluence within the right lower lung, potentially alveolar pulmonary edema or atelectasis, though note, underlying infection and/or aspiration could have a similar appearance. 4. Marked cardiomegaly with small to moderate-sized pericardial effusion, similar to recent abdominal CT performed 12/17/2019. Further evaluation cardiac echo could performed as indicated. 5. Nonocclusive debris within the carina with near occlusive debris within the bronchus intermedius, possibly secretions though aspiration could have a similar appearance. 6. Mediastinal lymphadenopathy,  nonspecific though presumably reactive in etiology. 7. Coronary calcifications.  Aortic Atherosclerosis (ICD10-I70.0). Electronically Signed   By: Simonne Come M.D.   On: 01/11/2020 09:13   CT ABDOMEN PELVIS W CONTRAST  Result Date: 12/17/2019 CLINICAL DATA:  Nausea, vomiting, abdominal pain. EXAM: CT ABDOMEN AND PELVIS WITH CONTRAST TECHNIQUE: Multidetector CT imaging of the abdomen and pelvis was performed using the standard protocol following bolus administration of intravenous contrast. CONTRAST:  OMNIPAQUE IOHEXOL 300 MG/ML  SOLN COMPARISON:  None. FINDINGS: Lower chest: Mild right pleural effusion is noted with adjacent subsegmental atelectasis. Small pericardial effusion is noted. Hepatobiliary: Hepatic steatosis is noted. No gallstones, gallbladder wall thickening, or biliary dilatation. Pancreas: Unremarkable. No pancreatic ductal dilatation or surrounding inflammatory changes. Spleen: Normal in size without focal abnormality. Adrenals/Urinary Tract: Adrenal glands are unremarkable. Kidneys are normal, without renal calculi, focal lesion, or hydronephrosis. Bladder is unremarkable. Stomach/Bowel: Stomach is within normal limits. Appendix appears normal. No evidence of bowel wall thickening, distention, or inflammatory changes. Vascular/Lymphatic: 4 cm infrarenal abdominal aortic aneurysm is noted. Atherosclerosis of abdominal aorta is noted. No adenopathy is noted. Reproductive: Prostate is unremarkable. Other: No abdominal wall hernia or abnormality. No abdominopelvic ascites. Musculoskeletal: No acute or significant osseous findings. IMPRESSION: 1. Mild right pleural effusion is noted with adjacent subsegmental atelectasis. 2. Small pericardial effusion. 3. Hepatic steatosis. 4. 4 cm infrarenal abdominal aortic aneurysm is noted. Recommend followup by [place] in 1 year. This recommendation follows ACR consensus guidelines: White Paper of the ACR Incidental Findings Committee II on Vascular Findings. J  Am Coll Radiol 2013; 10:789-794. Aortic Atherosclerosis (ICD10-I70.0). Electronically Signed   By: Lupita Raider M.D.   On: 12/17/2019 14:08   CARDIAC CATHETERIZATION  Result Date: 12/20/2019  Prox LAD to Mid LAD lesion is 40% stenosed.  Ost Cx to Mid Cx lesion is 100% stenosed.  Prox RCA lesion is 90% stenosed.  LV end diastolic pressure is mildly elevated.  Hemodynamic findings consistent with mild pulmonary hypertension.  1. 2 vessel obstructive CAD    - 100% proximal LCX at site of prior stent. This was a large vessel. No collaterals.    - 90% small co dominant RCA 2. Mildly elevated LV filling pressures 3. Mild pulmonary HTN 4. Preserved cardiac output. Plan: recommend medical management. The LCx is not salvageable. The RCA is quite small- only 2-2.25 mm diameter.   US AORTA  Result Date: 12/16/2019 CLINICAL DATA:  History of abdominal aortic aneurysm. EXAM: ULTRASOUND OF ABDOMINAL AORTA TECHNIQUE: Ultrasound examination of the abdominal aorta and proximal common iliac arteries was performed to evaluate for aneurysm. Additional color and Doppler images of the distal aorta were obtained to document patency. COMPARISON:  None. FINDINGS: Abdominal aortic measurements as follows: Proximal:  2.3 x 3.0 cm Mid:  3.8 x 4.9 cm Distal:  1.7 x  2.4 cm Patent: Yes, peak systolic velocity is 34 cm/s Right common iliac artery: 1 cm Left common iliac artery: 1 cm IMPRESSION: Aortic atherosclerosis with fusiform aortic aneurysm measuring 4.9 x 3.8 cm in maximum orthogonal diameters and extending about 5.6 cm craniocaudal distance. Electronically Signed   By: Kennith Center M.D.   On: 12/16/2019 18:50   DG CHEST PORT 1 VIEW  Result Date: 12/19/2019 CLINICAL DATA:  69 year old male with pleural effusion and infiltrate. Follow-up chest radiograph. EXAM: PORTABLE CHEST 1 VIEW COMPARISON:  Chest radiograph dated 12/16/2019 FINDINGS: There is moderate to severe cardiomegaly. Small right pleural effusion and right  lung base atelectasis or infiltrate similar or slightly improved since the prior radiograph. No pneumothorax. Atherosclerotic calcification of the aorta. No acute osseous pathology. IMPRESSION: Similar or slightly improved small right pleural effusion and right lung base atelectasis or infiltrate. Electronically Signed   By: Elgie Collard M.D.   On: 12/19/2019 17:47   DG Chest Portable 1 View  Result Date: 12/16/2019 CLINICAL DATA:  Increasing confusion and hypoxia EXAM: PORTABLE CHEST 1 VIEW COMPARISON:  08/27/2016 FINDINGS: Cardiac shadow is enlarged but stable. Aortic calcifications are again seen and stable. Increasing right basilar infiltrate with associated small effusion is seen. Left lung is clear. No bony abnormality is noted. IMPRESSION: Right basilar infiltrate and associated effusion consistent with acute pneumonia. Electronically Signed   By: Alcide Clever M.D.   On: 12/16/2019 11:14   ECHOCARDIOGRAM COMPLETE  Result Date: 12/17/2019    ECHOCARDIOGRAM REPORT   Patient Name:   GARRON ELINE Date of Exam: 12/17/2019 Medical Rec #:  [id number]       Height:       72.0 in Accession #:    [id number]      Weight:       160.0 lb Date of Birth:  [birth year]       BSA:          1.938 m Patient Age:    69 years        BP:           127/92 mmHg Patient Gender: M               HR:           102 bpm. Exam Location:  Inpatient Procedure: 2D Echo, 3D Echo, Color Doppler and Cardiac Doppler Indications:    I50.31 Acute diastolic (congestive) heart failure  History:        Patient has no prior history of Echocardiogram examinations.                 CHF, CAD, PAD; Risk Factors:Hypertension.  Sonographer:    Irving Burton Senior RDCS Referring Phys: [phone] RONDELL A SMITH IMPRESSIONS  1. Left ventricular ejection fraction, by estimation, is 15-20%. The left ventricle has severely decreased function. The left ventricle demonstrates global hypokinesis. The left ventricular internal cavity size was severely dilated.  Indeterminate diastolic filling due to E-A fusion.  2. Right ventricular systolic function is low normal. The right ventricular size is mildly enlarged. There is moderately elevated pulmonary artery systolic pressure. The estimated right ventricular systolic pressure is 45.2 mmHg.  3. Left atrial size was severely dilated.  4. Right atrial size was mild to moderately dilated.  5. The mitral valve is normal in structure. Moderate mitral valve regurgitation.  6. The aortic valve is normal in structure. Aortic valve regurgitation is not visualized.  7. The inferior vena cava is dilated in size with >50%  respiratory variability, suggesting right atrial pressure of 8 mmHg. FINDINGS  Left Ventricle: Left ventricular ejection fraction, by estimation, is 15-20%. The left ventricle has severely decreased function. The left ventricle demonstrates global hypokinesis. Mild dyskinesis of the left ventricular, entire apical segment. The left ventricular internal cavity size was severely dilated. There is no left ventricular hypertrophy. Indeterminate diastolic filling due to E-A fusion. Right Ventricle: The right ventricular size is mildly enlarged. No increase in right ventricular wall thickness. Right ventricular systolic function is low normal. There is moderately elevated pulmonary artery systolic pressure. The tricuspid regurgitant  velocity is 3.05 m/s, and with an assumed right atrial pressure of 8 mmHg, the estimated right ventricular systolic pressure is 45.2 mmHg. Left Atrium: Left atrial size was severely dilated. Right Atrium: Right atrial size was mild to moderately dilated. Pericardium: A small pericardial effusion is present. The pericardial effusion is circumferential. Mitral Valve: The mitral valve is normal in structure. Moderate mitral valve regurgitation, with centrally-directed jet. Tricuspid Valve: The tricuspid valve is normal in structure. Tricuspid valve regurgitation is mild. Aortic Valve: The aortic  valve is normal in structure. Aortic valve regurgitation is not visualized. Pulmonic Valve: The pulmonic valve was grossly normal. Pulmonic valve regurgitation is not visualized. Aorta: The aortic root and ascending aorta are structurally normal, with no evidence of dilitation. Venous: The inferior vena cava is dilated in size with greater than 50% respiratory variability, suggesting right atrial pressure of 8 mmHg. IAS/Shunts: No atrial level shunt detected by color flow Doppler.  LEFT VENTRICLE PLAX 2D LVIDd:         7.50 cm LVIDs:         6.90 cm LV PW:         0.90 cm LV IVS:        0.80 cm LVOT diam:     2.30 cm LV SV:         32 LV SV Index:   16 LVOT Area:     4.15 cm  LV Volumes (MOD) LV vol d, MOD A2C: 333.0 ml LV vol d, MOD A4C: 337.0 ml LV vol s, MOD A2C: 315.0 ml LV vol s, MOD A4C: 254.0 ml LV SV MOD A2C:     18.0 ml LV SV MOD A4C:     337.0 ml LV SV MOD BP:      49.0 ml RIGHT VENTRICLE RV S prime:     10.20 cm/s TAPSE (M-mode): 1.3 cm LEFT ATRIUM              Index       RIGHT ATRIUM           Index LA diam:        3.90 cm  2.01 cm/m  RA Area:     27.20 cm LA Vol (A2C):   126.0 ml 65.02 ml/m RA Volume:   92.60 ml  47.79 ml/m LA Vol (A4C):   94.9 ml  48.97 ml/m LA Biplane Vol: 111.0 ml 57.28 ml/m  AORTIC VALVE LVOT Vmax:   72.50 cm/s LVOT Vmean:  51.300 cm/s LVOT VTI:    0.076 m  AORTA Ao Root diam: 3.70 cm Ao Asc diam:  3.00 cm MITRAL VALVE                 TRICUSPID VALVE MV Area (PHT): 5.97 cm      TR Peak grad:   37.2 mmHg MV Decel Time: 127 msec      TR Vmax:  305.00 cm/s MR Peak grad:    72.6 mmHg MR Mean grad:    44.0 mmHg   SHUNTS MR Vmax:         426.00 cm/s Systemic VTI:  0.08 m MR Vmean:        313.0 cm/s  Systemic Diam: 2.30 cm MR PISA:         1.01 cm MR PISA Eff ROA: 9 mm MR PISA Radius:  0.40 cm MV E velocity: 109.00 cm/s Rachelle Hora Croitoru MD Electronically signed by Thurmon Fair MD Signature Date/Time: 12/17/2019/1:42:15 PM    Final    VAS [place] LOWER EXTREMITY VENOUS (DVT) (MC  and WL 7a-7p)  Result Date: 12/16/2019  Lower Venous DVTStudy Indications: Swelling, and SOB.  Comparison Study: no prior Performing Technologist: Jeb Levering RDMS, RVT  Examination Guidelines: A complete evaluation includes B-mode imaging, spectral Doppler, color Doppler, and power Doppler as needed of all accessible portions of each vessel. Bilateral testing is considered an integral part of a complete examination. Limited examinations for reoccurring indications may be performed as noted. The reflux portion of the exam is performed with the patient in reverse Trendelenburg.  +---------+---------------+---------+-----------+----------+--------------+ RIGHT    CompressibilityPhasicitySpontaneityPropertiesThrombus Aging +---------+---------------+---------+-----------+----------+--------------+ CFV      Full           Yes      Yes                                 +---------+---------------+---------+-----------+----------+--------------+ SFJ      Full                                                        +---------+---------------+---------+-----------+----------+--------------+ FV Prox  Full                                                        +---------+---------------+---------+-----------+----------+--------------+ FV Mid   Full                                                        +---------+---------------+---------+-----------+----------+--------------+ FV DistalFull                                                        +---------+---------------+---------+-----------+----------+--------------+ PFV      Full                                                        +---------+---------------+---------+-----------+----------+--------------+ POP      Full           Yes      Yes                                 +---------+---------------+---------+-----------+----------+--------------+  PTV      Full                                                         +---------+---------------+---------+-----------+----------+--------------+ PERO     Full                                                        +---------+---------------+---------+-----------+----------+--------------+   +---------+---------------+---------+-----------+----------+--------------+ LEFT     CompressibilityPhasicitySpontaneityPropertiesThrombus Aging +---------+---------------+---------+-----------+----------+--------------+ CFV      Full           Yes      Yes                                 +---------+---------------+---------+-----------+----------+--------------+ SFJ      Full                                                        +---------+---------------+---------+-----------+----------+--------------+ FV Prox  Full                                                        +---------+---------------+---------+-----------+----------+--------------+ FV Mid   Full                                                        +---------+---------------+---------+-----------+----------+--------------+ FV DistalFull                                                        +---------+---------------+---------+-----------+----------+--------------+ PFV      Full                                                        +---------+---------------+---------+-----------+----------+--------------+ POP      Full           Yes      Yes                                 +---------+---------------+---------+-----------+----------+--------------+ PTV      Full                                                        +---------+---------------+---------+-----------+----------+--------------+  PERO     Full                                                        +---------+---------------+---------+-----------+----------+--------------+     Summary: RIGHT: - There is no evidence of deep vein thrombosis in the lower extremity.  - No cystic structure found  in the popliteal fossa. - Pulsatile venous flow suggests increased right sided heart pressure.  LEFT: - There is no evidence of deep vein thrombosis in the lower extremity.  - A cystic structure is found in the popliteal fossa. - Pulsatile venous flow suggests increased right sided heart pressure.  *See table(s) above for measurements and observations. Electronically signed by Gretta Began MD on 12/16/2019 at 3:13:21 PM.    Final        Discharge Exam: Vitals:   01/13/20 2131 01/14/20 0834  BP: (!) 123/95 (!) 125/95  Pulse: 100 87  Resp: 18 19  Temp: 98 F (36.7 C) 97.9 F (36.6 C)  SpO2: 96% 99%    General: Pt is alert, awake, not in acute distress Cardiovascular: RRR, S1/S2 +, no edema Respiratory: CTA bilaterally, no wheezing, no rhonchi, no respiratory distress, no conversational dyspnea  Abdominal: Soft, NT, ND, bowel sounds + Extremities: no edema, no cyanosis Psych: Normal mood and affect   The results of significant diagnostics from this hospitalization (including imaging, microbiology, ancillary and laboratory) are listed below for reference.     Microbiology: Recent Results (from the past 240 hour(s))  Respiratory Panel by RT PCR (Flu A&B, Covid) - Nasopharyngeal Swab     Status: None   Collection Time: 01/10/20 11:05 PM   Specimen: Nasopharyngeal Swab  Result Value Ref Range Status   SARS Coronavirus 2 by RT PCR NEGATIVE NEGATIVE Final    Comment: (NOTE) SARS-CoV-2 target nucleic acids are NOT DETECTED. The SARS-CoV-2 RNA is generally detectable in upper respiratoy specimens during the acute phase of infection. The lowest concentration of SARS-CoV-2 viral copies this assay can detect is 131 copies/mL. A negative result does not preclude SARS-Cov-2 infection and should not be used as the sole basis for treatment or other patient management decisions. A negative result may occur with  improper specimen collection/handling, submission of specimen other than  nasopharyngeal swab, presence of viral mutation(s) within the areas targeted by this assay, and inadequate number of viral copies (<131 copies/mL). A negative result must be combined with clinical observations, patient history, and epidemiological information. The expected result is Negative. Fact Sheet for Patients:  https://www.moore.com/ Fact Sheet for Healthcare Providers:  https://www.young.biz/ This test is not yet ap proved or cleared by the [place] FDA and  has been authorized for detection and/or diagnosis of SARS-CoV-2 by FDA under an Emergency Use Authorization (EUA). This EUA will remain  in effect (meaning this test can be used) for the duration of the COVID-19 declaration under Section 564(b)(1) of the Act, 21 U.S.C. section 360bbb-3(b)(1), unless the authorization is terminated or revoked sooner.    Influenza A by PCR NEGATIVE NEGATIVE Final   Influenza B by PCR NEGATIVE NEGATIVE Final    Comment: (NOTE) The Xpert Xpress SARS-CoV-2/FLU/RSV assay is intended as an aid in  the diagnosis of influenza from Nasopharyngeal swab specimens and  should not be used as a sole basis for treatment. Nasal washings and  aspirates are unacceptable for Xpert Xpress SARS-CoV-2/FLU/RSV  testing. Fact Sheet for Patients: https://www.moore.com/ Fact Sheet for Healthcare Providers: https://www.young.biz/ This test is not yet approved or cleared by the [place] FDA and  has been authorized for detection and/or diagnosis of SARS-CoV-2 by  FDA under an Emergency Use Authorization (EUA). This EUA will remain  in effect (meaning this test can be used) for the duration of the  Covid-19 declaration under Section 564(b)(1) of the Act, 21  U.S.C. section 360bbb-3(b)(1), unless the authorization is  terminated or revoked. Performed at [facility], [address] [place], [place] [address]   Culture,  blood (routine x 2)     Status: None (Preliminary result)   Collection Time: 01/11/20  9:46 AM   Specimen: BLOOD RIGHT HAND  Result Value Ref Range Status   Specimen Description BLOOD RIGHT HAND  Final   Special Requests   Final    BOTTLES DRAWN AEROBIC AND ANAEROBIC Blood Culture adequate volume   Culture   Final    NO GROWTH 3 DAYS Performed at [facility], [address] [place], [place] [address]    Report Status PENDING  Incomplete  Culture, Urine     Status: None   Collection Time: 01/11/20 10:06 AM   Specimen: Urine, Random  Result Value Ref Range Status   Specimen Description URINE, RANDOM  Final   Special Requests NONE  Final   Culture   Final    NO GROWTH Performed at [facility], [address] [place], [place] [address]    Report Status 01/12/2020 FINAL  Final  Culture, blood (routine x 2)     Status: None (Preliminary result)   Collection Time: 01/11/20 10:06 AM   Specimen: BLOOD LEFT HAND  Result Value Ref Range Status   Specimen Description BLOOD LEFT HAND  Final   Special Requests   Final    BOTTLES DRAWN AEROBIC ONLY Blood Culture results may not be optimal due to an inadequate volume of blood received in culture bottles   Culture   Final    NO GROWTH 3 DAYS Performed at [facility], [address] [place], [place] [address]    Report Status PENDING  Incomplete  MRSA PCR Screening     Status: None   Collection Time: 01/11/20 11:10 AM   Specimen: Nasopharyngeal  Result Value Ref Range Status   MRSA by PCR NEGATIVE NEGATIVE Final    Comment:        The GeneXpert MRSA Assay (FDA approved for NASAL specimens only), is one component of a comprehensive MRSA colonization surveillance program. It is not intended to diagnose MRSA infection nor to guide or monitor treatment for MRSA infections. Performed at [facility], [address], [place], [place] [address]      Labs: BNP (last 3 results) Recent Labs    12/16/19 1041  01/10/20 1638  BNP >4,500.0* 2,714.5*   Basic Metabolic Panel: Recent Labs  Lab 01/10/20 1638 01/10/20 2351 01/11/20 0447 01/11/20 0820 01/11/20 0828 01/12/20 0423 01/13/20 0341  NA 140  --   --  142 140 141 139  K 3.7  --   --  4.0 4.2 4.0 3.7  CL 101  --   --  104  --  100 101  CO2 30  --   --  23  --  30 29  GLUCOSE 113*  --   --  116*  --  125*  102*  BUN 5*  --   --  5*  --  10 9  CREATININE 0.83  --   --  0.79  --  0.76 0.68  CALCIUM 9.1  --   --  9.3  --  9.1 8.9  MG  --  2.1 1.9  --   --  2.2 1.9  PHOS  --   --   --   --   --   --  3.2   Liver Function Tests: No results for input(s): AST, ALT, ALKPHOS, BILITOT, PROT, ALBUMIN in the last 168 hours. No results for input(s): LIPASE, AMYLASE in the last 168 hours. No results for input(s): AMMONIA in the last 168 hours. CBC: Recent Labs  Lab 01/10/20 1638 01/11/20 0820 01/11/20 0828 01/12/20 0423 01/13/20 0341  WBC 6.1 10.9*  --  7.3 6.9  NEUTROABS  --  7.5  --  4.4 4.0  HGB 13.4 16.0 15.6 13.8 12.8*  HCT 41.3 51.0 46.0 42.7 39.9  MCV 104.8* 107.8*  --  106.8* 105.8*  PLT 210 244  --  191 171   Cardiac Enzymes: No results for input(s): CKTOTAL, CKMB, CKMBINDEX, TROPONINI in the last 168 hours. BNP: Invalid input(s): POCBNP CBG: Recent Labs  Lab 01/11/20 1107  GLUCAP 108*   D-Dimer No results for input(s): DDIMER in the last 72 hours. Hgb A1c No results for input(s): HGBA1C in the last 72 hours. Lipid Profile No results for input(s): CHOL, HDL, LDLCALC, TRIG, CHOLHDL, LDLDIRECT in the last 72 hours. Thyroid function studies No results for input(s): TSH, T4TOTAL, T3FREE, THYROIDAB in the last 72 hours.  Invalid input(s): FREET3 Anemia work up No results for input(s): VITAMINB12, FOLATE, FERRITIN, TIBC, IRON, RETICCTPCT in the last 72 hours. Urinalysis    Component Value Date/Time   COLORURINE STRAW (A) 01/11/2020 0247   APPEARANCEUR HAZY (A) 01/11/2020 0247   LABSPEC 1.004 (L) 01/11/2020 0247    PHURINE 8.0 01/11/2020 0247   GLUCOSEU NEGATIVE 01/11/2020 0247   HGBUR SMALL (A) 01/11/2020 0247   BILIRUBINUR NEGATIVE 01/11/2020 0247   KETONESUR NEGATIVE 01/11/2020 0247   PROTEINUR NEGATIVE 01/11/2020 0247   NITRITE NEGATIVE 01/11/2020 0247   LEUKOCYTESUR SMALL (A) 01/11/2020 0247   Sepsis Labs Invalid input(s): PROCALCITONIN,  WBC,  LACTICIDVEN Microbiology Recent Results (from the past 240 hour(s))  Respiratory Panel by RT PCR (Flu A&B, Covid) - Nasopharyngeal Swab     Status: None   Collection Time: 01/10/20 11:05 PM   Specimen: Nasopharyngeal Swab  Result Value Ref Range Status   SARS Coronavirus 2 by RT PCR NEGATIVE NEGATIVE Final    Comment: (NOTE) SARS-CoV-2 target nucleic acids are NOT DETECTED. The SARS-CoV-2 RNA is generally detectable in upper respiratoy specimens during the acute phase of infection. The lowest concentration of SARS-CoV-2 viral copies this assay can detect is 131 copies/mL. A negative result does not preclude SARS-Cov-2 infection and should not be used as the sole basis for treatment or other patient management decisions. A negative result may occur with  improper specimen collection/handling, submission of specimen other than nasopharyngeal swab, presence of viral mutation(s) within the areas targeted by this assay, and inadequate number of viral copies (<131 copies/mL). A negative result must be combined with clinical observations, patient history, and epidemiological information. The expected result is Negative. Fact Sheet for Patients:  PinkCheek.be Fact Sheet for Healthcare Providers:  GravelBags.it This test is not yet ap proved or cleared by the [place] and  has been authorized for  detection and/or diagnosis of SARS-CoV-2 by FDA under an Emergency Use Authorization (EUA). This EUA will remain  in effect (meaning this test can be used) for the duration of the COVID-19  declaration under Section 564(b)(1) of the Act, 21 U.S.C. section 360bbb-3(b)(1), unless the authorization is terminated or revoked sooner.    Influenza A by PCR NEGATIVE NEGATIVE Final   Influenza B by PCR NEGATIVE NEGATIVE Final    Comment: (NOTE) The Xpert Xpress SARS-CoV-2/FLU/RSV assay is intended as an aid in  the diagnosis of influenza from Nasopharyngeal swab specimens and  should not be used as a sole basis for treatment. Nasal washings and  aspirates are unacceptable for Xpert Xpress SARS-CoV-2/FLU/RSV  testing. Fact Sheet for Patients: https://www.moore.com/ Fact Sheet for Healthcare Providers: https://www.young.biz/ This test is not yet approved or cleared by the [place] FDA and  has been authorized for detection and/or diagnosis of SARS-CoV-2 by  FDA under an Emergency Use Authorization (EUA). This EUA will remain  in effect (meaning this test can be used) for the duration of the  Covid-19 declaration under Section 564(b)(1) of the Act, 21  U.S.C. section 360bbb-3(b)(1), unless the authorization is  terminated or revoked. Performed at [facility], [address], [place], [place] [address]   Culture, blood (routine x 2)     Status: None (Preliminary result)   Collection Time: 01/11/20  9:46 AM   Specimen: BLOOD RIGHT HAND  Result Value Ref Range Status   Specimen Description BLOOD RIGHT HAND  Final   Special Requests   Final    BOTTLES DRAWN AEROBIC AND ANAEROBIC Blood Culture adequate volume   Culture   Final    NO GROWTH 3 DAYS Performed at [facility], [address] [place], [place] [address]    Report Status PENDING  Incomplete  Culture, Urine     Status: None   Collection Time: 01/11/20 10:06 AM   Specimen: Urine, Random  Result Value Ref Range Status   Specimen Description URINE, RANDOM  Final   Special Requests NONE  Final   Culture   Final    NO GROWTH Performed at [facility],  [address], [place], [place] [address]    Report Status 01/12/2020 FINAL  Final  Culture, blood (routine x 2)     Status: None (Preliminary result)   Collection Time: 01/11/20 10:06 AM   Specimen: BLOOD LEFT HAND  Result Value Ref Range Status   Specimen Description BLOOD LEFT HAND  Final   Special Requests   Final    BOTTLES DRAWN AEROBIC ONLY Blood Culture results may not be optimal due to an inadequate volume of blood received in culture bottles   Culture   Final    NO GROWTH 3 DAYS Performed at [facility], [address] [place], [place] [address]    Report Status PENDING  Incomplete  MRSA PCR Screening     Status: None   Collection Time: 01/11/20 11:10 AM   Specimen: Nasopharyngeal  Result Value Ref Range Status   MRSA by PCR NEGATIVE NEGATIVE Final    Comment:        The GeneXpert MRSA Assay (FDA approved for NASAL specimens only), is one component of a comprehensive MRSA colonization surveillance program. It is not intended to diagnose MRSA infection nor to guide or monitor treatment for MRSA infections. Performed at [facility], [address], [place], [place] [address]  Patient was seen and examined on the day of discharge and was found to be in stable condition. Time coordinating discharge: 25 minutes including assessment and coordination of care, as well as examination of the patient.   SIGNED:  Noralee StainJennifer Towanna Avery, DO [facility] 01/14/2020, 10:09 AM

## 2020-01-16 LAB — CULTURE, BLOOD (ROUTINE X 2)
Culture: NO GROWTH
Culture: NO GROWTH
Special Requests: ADEQUATE

## 2020-01-24 NOTE — Progress Notes (Addendum)
Cardiology Office Note   Date:  01/28/2020   ID:  Tome, Wilson 1950-10-14, MRN [id number]  PCP:  Rometta Emery, MD Cardiologist:  Parke Poisson, MD 12/19/2019 Electrphysiologist: None Theodore Demark, PA-C   Chief Complaint  Patient presents with  . Follow-up    1-2 weeks.    History of Present Illness: Cole Fuller is a 69 y.o. male with a history of  CAD with remote LCx PCI in 2004 per Dr. Allyson Sabal (recath 09/2004 with no intervention), HTN, PAD status post PCI per Dr. Allyson Sabal in 2004, AAA, anxiety, arthritis, tobacco abuse and chronic pain   Admitted 05/03-05/03/2020 for mult complaints, deconditioning, developed pulm edema 05/04>>BiPAP & IV Lasix, O2 sats ok at d/c, wt 151 lbs on 05/05  [name] presents for cardiology follow up.   Pt stays nauseated, worst in the morning. He wakes up with it.  His appetite has been poor, he is not eating very much.  This is partly because the nausea and partly because he does not have much desire for food.  He takes all a.m. meds at once and takes all p.m. meds at once.  His wife manages the medications and is very careful to be compliant.  However, he has only been taking the Coreg once a day.   He takes oxycodone about 5 x day. With his chronic pain issues, he needs it.  He does not feel like he would be able to walk from the back pain.  Has chronic pedal edema, worse on the R.  This is not new and not any worse.  He is aware that his weight has been decreasing.  His wife mentions that he was referred to hospice.  His wife asked if he really does only have 6 months to live.    They are going to the beach for a long weekend.  They stay in an [facility], and are on the third floor.  He has a Rollator to help with ambulation.  He is struggling with constipation.  This is a chronic problem for him but has gotten worse since he has been on diuretics and the pain pills.                              Past Medical History:    Diagnosis Date  . Acute metabolic encephalopathy 12/16/2019  . Anxiety   . Arthritis   . Arthritis pain   . Back pain   . Bruises easily   . CHF (congestive heart failure) (HCC)   . Chills   . Coronary artery disease    with stenting in 2004, sees Dr Allyson Sabal  . Hypertension   . Inguinal hernia    right  . Inguinal hernia    Right  . NSTEMI (non-ST elevated myocardial infarction) (HCC) 03/16/2016  . Peripheral vascular disease (HCC)   . Postop check 02/18/2012    Past Surgical History:  Procedure Laterality Date  . CARDIAC CATHETERIZATION  2004   with stents x2  . FRACTURE SURGERY     left foot  . heart stents    . INGUINAL HERNIA REPAIR  01/10/2012   Procedure: HERNIA REPAIR INGUINAL ADULT;  Surgeon: Cherylynn Ridges, MD;  Location: [facility] OR;  Service: General;  Laterality: Right;  . KNEE ARTHROSCOPY     Right  . ORTHOPEDIC SURGERY    . PERIPHERAL VASCULAR CATHETERIZATION N/A 09/15/2015   Procedure:  Abdominal Aortogram;  Surgeon: Sherren Kerns, MD;  Location: [facility] INVASIVE CV LAB;  Service: Cardiovascular;  Laterality: N/A;  . RIGHT/LEFT HEART CATH AND CORONARY ANGIOGRAPHY N/A 12/20/2019   Procedure: RIGHT/LEFT HEART CATH AND CORONARY ANGIOGRAPHY;  Surgeon: Swaziland, Peter M, MD;  Location: [facility] INVASIVE CV LAB;  Service: Cardiovascular;  Laterality: N/A;  . stents in leg     right    Current Outpatient Medications  Medication Sig Dispense Refill  . alprazolam (XANAX) 2 MG tablet Take 2 mg by mouth 3 (three) times daily.     Marland Kitchen aspirin 81 MG chewable tablet Chew 1 tablet (81 mg total) by mouth daily. 30 tablet 1  . carvedilol (COREG) 3.125 MG tablet Take 1 tablet (3.125 mg total) by mouth 2 (two) times daily with a meal. 60 tablet 0  . furosemide (LASIX) 20 MG tablet Take 1 tablet (20 mg total) by mouth daily. 30 tablet 1  . losartan (COZAAR) 25 MG tablet Take 0.5 tablets (12.5 mg total) by mouth daily. (Patient taking differently: Take 25 mg by mouth daily. ) 30 tablet 1  . nicotine  (NICODERM CQ - DOSED IN MG/24 HOURS) 21 mg/24hr patch Place 1 patch (21 mg total) onto the skin daily. (Patient taking differently: Place 21 mg onto the skin daily as needed (smoking cessation). ) 28 patch 0  . oxyCODONE-acetaminophen (PERCOCET) 10-325 MG tablet Take 1 tablet by mouth 3 (three) times daily as needed for pain.    . pantoprazole (PROTONIX) 40 MG tablet Take 1 tablet (40 mg total) by mouth daily at 6 (six) AM. 30 tablet 1  . rosuvastatin (CRESTOR) 40 MG tablet Take 1 tablet (40 mg total) by mouth daily at 6 PM. 30 tablet 1  . tamsulosin (FLOMAX) 0.4 MG CAPS capsule Take 0.4 mg by mouth daily.    . traZODone (DESYREL) 100 MG tablet Take 1 tablet (100 mg total) by mouth at bedtime. 30 tablet 0   No current facility-administered medications for this visit.    Allergies:   Benicar hct [olmesartan medoxomil-hctz], Butrans [buprenorphine], Nadolol, Morphine and related, and Prednisone    Social History:  The patient  reports that he has been smoking cigarettes. He has been smoking about 0.50 packs per day. He has never used smokeless tobacco. He reports that he does not drink alcohol or use drugs.   Family History:  The patient's family history is not on file.  He indicated that his mother is deceased. He indicated that his father is deceased.  ROS:  Please see the history of present illness. All other systems are reviewed and negative.   PHYSICAL EXAM: VS:  BP (!) 94/56 (BP Location: Left Arm, Patient Position: Sitting, Cuff Size: Normal)   Pulse 80   Temp (!) 97.3 F (36.3 C)   Ht 6' (1.829 m)   Wt 148 lb (67.1 kg)   BMI 20.07 kg/m  , BMI Body mass index is 20.07 kg/m. GEN: Frail male appears older than his stated age, in no acute distress but appears to be extremely weak, unable to maintain an upright seated position HEENT: normal for age  Neck: no JVD, no carotid bruit, no masses Cardiac: RRR; soft murmur, no rubs, or gallops Respiratory: Decreased breath sounds  bilateral bases to auscultation, increased work of breathing and increased respiratory rate GI: soft, nontender, nondistended, + BS MS: no deformity or atrophy; no edema; distal pulses are 2+ in all 4 extremities  Skin: warm and dry, no rash Neuro:  Strength and sensation are intact Psych: euthymic mood, full affect   EKG:  EKG is not ordered today.  ECHO: 12/17/2019 1. Left ventricular ejection fraction, by estimation, is 15-20%. The left  ventricle has severely decreased function. The left ventricle demonstrates  global hypokinesis. The left ventricular internal cavity size was severely  dilated. Indeterminate  diastolic filling due to E-A fusion.  2. Right ventricular systolic function is low normal. The right  ventricular size is mildly enlarged. There is moderately elevated  pulmonary artery systolic pressure. The estimated right ventricular  systolic pressure is 78.2 mmHg.  3. Left atrial size was severely dilated.  4. Right atrial size was mild to moderately dilated.  5. The mitral valve is normal in structure. Moderate mitral valve  regurgitation.  6. The aortic valve is normal in structure. Aortic valve regurgitation is  not visualized.  7. The inferior vena cava is dilated in size with >50% respiratory  variability, suggesting right atrial pressure of 8 mmHg.   CATH: 12/20/2019  Prox LAD to Mid LAD lesion is 40% stenosed.  Ost Cx to Mid Cx lesion is 100% stenosed.  Prox RCA lesion is 90% stenosed.  LV end diastolic pressure is mildly elevated.  Hemodynamic findings consistent with mild pulmonary hypertension.   1. 2 vessel obstructive CAD    - 100% proximal LCX at site of prior stent. This was a large vessel. No collaterals.    - 90% small co dominant RCA 2. Mildly elevated LV filling pressures 3. Mild pulmonary HTN 4. Preserved cardiac output.   Plan: recommend medical management. The LCx is not salvageable. The RCA is quite small- only 2-2.25 mm  diameter.   Recent Labs: 12/16/2019: ALT 49; TSH 1.311 01/10/2020: B Natriuretic Peptide 2,714.5 01/13/2020: BUN 9; Creatinine, Ser 0.68; Hemoglobin 12.8; Magnesium 1.9; Platelets 171; Potassium 3.7; Sodium 139  CBC    Component Value Date/Time   WBC 6.9 01/13/2020 0341   RBC 3.77 (L) 01/13/2020 0341   HGB 12.8 (L) 01/13/2020 0341   HCT 39.9 01/13/2020 0341   PLT 171 01/13/2020 0341   MCV 105.8 (H) 01/13/2020 0341   MCH 34.0 01/13/2020 0341   MCHC 32.1 01/13/2020 0341   RDW 13.9 01/13/2020 0341   LYMPHSABS 2.3 01/13/2020 0341   MONOABS 0.5 01/13/2020 0341   EOSABS 0.1 01/13/2020 0341   BASOSABS 0.1 01/13/2020 0341   CMP Latest Ref Rng & Units 01/13/2020 01/12/2020 01/11/2020  Glucose 70 - 99 mg/dL 102(H) 125(H) -  BUN 8 - 23 mg/dL 9 10 -  Creatinine 0.61 - 1.24 mg/dL 0.68 0.76 -  Sodium 135 - 145 mmol/L 139 141 140  Potassium 3.5 - 5.1 mmol/L 3.7 4.0 4.2  Chloride 98 - 111 mmol/L 101 100 -  CO2 22 - 32 mmol/L 29 30 -  Calcium 8.9 - 10.3 mg/dL 8.9 9.1 -  Total Protein 6.5 - 8.1 g/dL - - -  Total Bilirubin 0.3 - 1.2 mg/dL - - -  Alkaline Phos 38 - 126 U/L - - -  AST 15 - 41 U/L - - -  ALT 0 - 44 U/L - - -     Lipid Panel No results found for: CHOL, HDL, LDLCALC, LDLDIRECT, TRIG, CHOLHDL    Wt Readings from Last 3 Encounters:  01/28/20 148 lb (67.1 kg)  01/12/20 151 lb 10.8 oz (68.8 kg)  12/21/19 135 lb 8 oz (61.5 kg)     Other studies Reviewed: Additional studies/ records that were reviewed today include: Office  notes, hospital records and testing.  ASSESSMENT AND PLAN:  1.  Chronic systolic CHF  : -Check a BMET to make sure he is tolerating the medications and diuresis. -Check a CBC to make sure he is not significantly anemic and that is causing his shortness of breath -He does not qualify for home O2 and does not have significant CHF on exam  2.  Hypotension: -He is asymptomatic with a systolic blood pressure in the 90s.  According to his wife, his inability to  maintain an upright posture and stand and walk by himself has been ongoing. -Currently he is on a minimal dose of carvedilol and losartan, as well as low-dose Lasix. -As long as he denies symptoms, continue these meds -His wife was advised that if she wished to give daily medications apart from his other medications such as at lunch, that would be acceptable  3.  Weakness, constipation: -The constipation is worse than it used to be, but he is taking oxycodone 10 mg 5 times a day at this point. -The weakness may be a side effect of the narcotics but he is also on Xanax 2 mg 3 times daily and this may contribute.  4.  Hospice care: I discussed the situation with them and advised that he could improve and be discharged by hospice, but right now I felt that he was appropriate.  Advised that there was no reason for them not to go to the beach, but I did not feel that he would do well on stairs.  I suggested she get a room on the lowest floor that she could, and consider renting a wheelchair   Current medicines are reviewed at length with the patient today.  The patient has concerns regarding medicines.  Concerns were addressed  The following changes have been made:  no change  Labs/ tests ordered today include:   Orders Placed This Encounter  Procedures  . Basic metabolic panel  . CBC    Disposition:   FU with Parke Poisson, MD  Signed, Theodore Demark, PA-C  01/28/2020 6:33 PM    [facility] Phone: [phone]; Fax: [phone]

## 2020-01-28 ENCOUNTER — Ambulatory Visit: Payer: Medicare Other | Admitting: Physician Assistant

## 2020-01-28 ENCOUNTER — Encounter: Payer: Self-pay | Admitting: Physician Assistant

## 2020-01-28 ENCOUNTER — Other Ambulatory Visit: Payer: Self-pay

## 2020-01-28 VITALS — BP 94/56 | HR 80 | Temp 97.3°F | Ht 72.0 in | Wt 148.0 lb

## 2020-01-28 DIAGNOSIS — K5903 Drug induced constipation: Secondary | ICD-10-CM | POA: Diagnosis not present

## 2020-01-28 DIAGNOSIS — I952 Hypotension due to drugs: Secondary | ICD-10-CM

## 2020-01-28 DIAGNOSIS — R531 Weakness: Secondary | ICD-10-CM

## 2020-01-28 DIAGNOSIS — I5022 Chronic systolic (congestive) heart failure: Secondary | ICD-10-CM | POA: Diagnosis not present

## 2020-01-28 DIAGNOSIS — Z515 Encounter for palliative care: Secondary | ICD-10-CM

## 2020-01-28 MED ORDER — PANTOPRAZOLE SODIUM 40 MG PO TBEC: 40.0000 mg | DELAYED_RELEASE_TABLET | Freq: Two times a day (BID) | ORAL | 3 refills | Status: AC

## 2020-01-28 NOTE — Patient Instructions (Addendum)
Medication Instructions:   Increase Pantoprazole to 1 tablet twice daily.  OK to take once daily medications at any time of the day.  *If you need a refill on your cardiac medications before your next appointment, please call your pharmacy*   Lab Work: Your physician recommends that you return for lab work today: BMET, CBC  If you have labs (blood work) drawn today and your tests are completely normal, you will receive your results only by: Marland Kitchen MyChart Message (if you have MyChart) OR . A paper copy in the mail If you have any lab test that is abnormal or we need to change your treatment, we will call you to review the results.   Follow-Up: At [facility], you and your health needs are our priority.  As part of our continuing mission to provide you with exceptional heart care, we have created designated Provider Care Teams.  These Care Teams include your primary Cardiologist (physician) and Advanced Practice Providers (APPs -  Physician Assistants and Nurse Practitioners) who all work together to provide you with the care you need, when you need it.  We recommend signing up for the patient portal called "MyChart".  Sign up information is provided on this After Visit Summary.  MyChart is used to connect with patients for Virtual Visits (Telemedicine).  Patients are able to view lab/test results, encounter notes, upcoming appointments, etc.  Non-urgent messages can be sent to your provider as well.   To learn more about what you can do with MyChart, go to [email or web address].    Your next appointment:   3-4 week(s)  The format for your next appointment:   In Person  Provider:   Weston Brass, MD   Other Instructions  Cole Fuller has suggested taking Colace 100 mg twice daily for constipation.  Record blood pressure and heart rate daily. Bring these readings with you to your follow-up appointment.  WEIGH DAILY, every am, wearing the same amount of clothing Record weights,  contact Parke Poisson, MD for weight gain of 3 lbs in a day or 5 lbs in a week Limit sodium to 500 mg per meal, total 2000 mg per day Limit all liquids to 1.5-2 liters/quarts per day

## 2020-01-29 LAB — CBC
Hematocrit: 39.4 % (ref 37.5–51.0)
Hemoglobin: 13.4 g/dL (ref 13.0–17.7)
MCH: 34.4 pg — ABNORMAL HIGH (ref 26.6–33.0)
MCHC: 34 g/dL (ref 31.5–35.7)
MCV: 101 fL — ABNORMAL HIGH (ref 79–97)
Platelets: 176 10*3/uL (ref 150–450)
RBC: 3.89 x10E6/uL — ABNORMAL LOW (ref 4.14–5.80)
RDW: 12.7 % (ref 11.6–15.4)
WBC: 6 10*3/uL (ref 3.4–10.8)

## 2020-01-29 LAB — BASIC METABOLIC PANEL
BUN/Creatinine Ratio: 11 (ref 10–24)
BUN: 9 mg/dL (ref 8–27)
CO2: 28 mmol/L (ref 20–29)
Calcium: 9.3 mg/dL (ref 8.6–10.2)
Chloride: 105 mmol/L (ref 96–106)
Creatinine, Ser: 0.83 mg/dL (ref 0.76–1.27)
GFR calc Af Amer: 104 mL/min/{1.73_m2} (ref >59)
GFR calc non Af Amer: 90 mL/min/{1.73_m2} (ref >59)
Glucose: 97 mg/dL (ref 65–99)
Potassium: 4 mmol/L (ref 3.5–5.2)
Sodium: 145 mmol/L — ABNORMAL HIGH (ref 134–144)

## 2020-02-29 ENCOUNTER — Other Ambulatory Visit: Payer: Self-pay

## 2020-02-29 ENCOUNTER — Encounter: Payer: Self-pay | Admitting: Physician Assistant

## 2020-02-29 ENCOUNTER — Ambulatory Visit (INDEPENDENT_AMBULATORY_CARE_PROVIDER_SITE_OTHER): Admitting: Physician Assistant

## 2020-02-29 VITALS — BP 98/56 | HR 85 | Ht 71.0 in | Wt 148.8 lb

## 2020-02-29 DIAGNOSIS — I714 Abdominal aortic aneurysm, without rupture, unspecified: Secondary | ICD-10-CM

## 2020-02-29 DIAGNOSIS — I739 Peripheral vascular disease, unspecified: Secondary | ICD-10-CM | POA: Diagnosis not present

## 2020-02-29 DIAGNOSIS — I5023 Acute on chronic systolic (congestive) heart failure: Secondary | ICD-10-CM

## 2020-02-29 DIAGNOSIS — I10 Essential (primary) hypertension: Secondary | ICD-10-CM

## 2020-02-29 NOTE — Progress Notes (Signed)
Cardiology Office Note   Date:  02/29/2020   ID:  Cole Fuller, DOB [birth year], MRN [id number]  PCP:  Cole Emery, MD Cardiologist:  Cole Poisson, MD 12/19/2019 Electrphysiologist: None Cole Demark, PA-C 01/28/2020  History of Present Illness: Cole Fuller is a 69 y.o. male with a history of CAD with remoteLCx PCIin 2004 per Cole. Malon Fuller 09/2004 with no intervention), HTN, PAD status post PCI perDr. Allyson Fuller in 2004, AAA, anxiety, arthritis, tobacco abuse and chronic pain  Admitted 05/03-05/03/2020 for mult complaints, deconditioning, developed pulm edema 05/04>>BiPAP & IV Lasix, O2 sats ok at d/c, 05/05 wt 151 lbs   Office visit 5/21, weight 148 pounds, SBP 90s, patient on minimal dose of carvedilol and losartan as well as low-dose Lasix, these meds were continued because he was not clearly orthostatic although he was weak.  He was on oxycodone 10 mg 5 times a day and Xanax 2 mg 3 times daily, possibly contributing to the weakness.  They were planning on taking a beach trip but he would have to do stairs.  I requested she consider this carefully.  Cole Fuller presents for cardiology follow up.  He is doing a little better now.   They did not go to the beach yet, he had a bad week when they were supposed to go. He has improved.   His back is still very bad, that limits him more than his breathing.   He still has significant DOE, still w/ LE edema but improved. His legs improve when propped up. There is some erythema, but no fevers. He wakes w/ LE edema, at least 1+ by description. This increases throughout the day. He denies orthopnea or PND.   No chest pain.   No palpitations, no presyncope or syncope.   He had ABX a few weeks ago for cellulitis, but his feet have gotten red again. He has 1 blister that has been weeping.   His Lasix was recently increased from 20 mg qd >> 40 mg am, 20 mg pm. The edema initially improved, but the fluid keeps coming  back.  The hospice nurses check him regularly.   Past Medical History:  Diagnosis Date   Acute metabolic encephalopathy 12/16/2019   Anxiety    Arthritis    Arthritis pain    Back pain    Bruises easily    CHF (congestive heart failure) (HCC)    Chills    Coronary artery disease    with stenting in 2004, sees Cole Fuller   Hypertension    Inguinal hernia    right   Inguinal hernia    Right   NSTEMI (non-ST elevated myocardial infarction) (HCC) 03/16/2016   Peripheral vascular disease (HCC)    Postop check 02/18/2012    Past Surgical History:  Procedure Laterality Date   CARDIAC CATHETERIZATION  2004   with stents x2   FRACTURE SURGERY     left foot   heart stents     INGUINAL HERNIA REPAIR  01/10/2012   Procedure: HERNIA REPAIR INGUINAL ADULT;  Surgeon: Cole Ridges, MD;  Location: [facility] OR;  Service: General;  Laterality: Right;   KNEE ARTHROSCOPY     Right   ORTHOPEDIC SURGERY     PERIPHERAL VASCULAR CATHETERIZATION N/A 09/15/2015   Procedure: Abdominal Aortogram;  Surgeon: Cole Kerns, MD;  Location: [facility] INVASIVE CV LAB;  Service: Cardiovascular;  Laterality: N/A;   RIGHT/LEFT HEART CATH AND CORONARY ANGIOGRAPHY N/A 12/20/2019   Procedure:  RIGHT/LEFT HEART CATH AND CORONARY ANGIOGRAPHY;  Surgeon: Swaziland, Peter M, MD;  Location: [facility] INVASIVE CV LAB;  Service: Cardiovascular;  Laterality: N/A;   stents in leg     right    Current Outpatient Medications  Medication Sig Dispense Refill   alprazolam (XANAX) 2 MG tablet Take 2 mg by mouth 3 (three) times daily.      aspirin 81 MG chewable tablet Chew 1 tablet (81 mg total) by mouth daily. 30 tablet 1   carvedilol (COREG) 3.125 MG tablet Take 1 tablet (3.125 mg total) by mouth 2 (two) times daily with a meal. 60 tablet 0   fentaNYL (DURAGESIC) 50 MCG/HR Place onto the skin.     furosemide (LASIX) 20 MG tablet Take 1 tablet (20 mg total) by mouth daily. 30 tablet 1   losartan (COZAAR) 25 MG tablet  Take 0.5 tablets (12.5 mg total) by mouth daily. (Patient taking differently: Take 25 mg by mouth daily. ) 30 tablet 1   nicotine (NICODERM CQ - DOSED IN MG/24 HOURS) 21 mg/24hr patch Place 1 patch (21 mg total) onto the skin daily. (Patient taking differently: Place 21 mg onto the skin daily as needed (smoking cessation). ) 28 patch 0   oxyCODONE-acetaminophen (PERCOCET) 10-325 MG tablet Take 1 tablet by mouth 3 (three) times daily as needed for pain.     pantoprazole (PROTONIX) 40 MG tablet Take 1 tablet (40 mg total) by mouth 2 (two) times daily. 60 tablet 3   rosuvastatin (CRESTOR) 40 MG tablet Take 1 tablet (40 mg total) by mouth daily at 6 PM. 30 tablet 1   tamsulosin (FLOMAX) 0.4 MG CAPS capsule Take 0.4 mg by mouth daily.     traZODone (DESYREL) 100 MG tablet Take 1 tablet (100 mg total) by mouth at bedtime. 30 tablet 0   No current facility-administered medications for this visit.    Allergies:   Benicar hct [olmesartan medoxomil-hctz], Butrans [buprenorphine], Nadolol, Morphine and related, and Prednisone    Social History:  The patient  reports that he has been smoking cigarettes. He has been smoking about 0.50 packs per day. He has never used smokeless tobacco. He reports that he does not drink alcohol and does not use drugs.   Family History:  The patient's family history is not on file.  He indicated that his mother is deceased. He indicated that his father is deceased.    ROS:  Please see the history of present illness. All other systems are reviewed and negative.    PHYSICAL EXAM: VS:  BP (!) 98/56    Pulse 85    Ht 5\' 11"  (1.803 Fuller)    Wt 148 lb 12.8 oz (67.5 kg)    SpO2 97%    BMI 20.75 kg/Fuller  , BMI Body mass index is 20.75 kg/Fuller. GEN: Well nourished, well developed, male in no acute distress HEENT: normal for age  Neck:  JVD 9 cm, no carotid bruit, no masses Cardiac: RRR; soft murmur, no rubs, or gallops Respiratory: decreased BS w/ scattered dry rales  bilaterally, normal work of breathing GI: soft, nontender, nondistended, + BS MS: no deformity or atrophy; 3-2+ LE edema; distal radial are 2+ in upper extremities  Skin: warm and dry, no rash, +erythema bilateral LE, L>R Neuro:  Strength and sensation are intact Psych: euthymic mood, full affect   EKG:  EKG is ordered today. 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 Left ventricular ejection fraction, by estimation, is 15-20%. The left  ventricle has severely decreased function. The left ventricle demonstrates  global hypokinesis. The left ventricular internal cavity size was severely  dilated. Indeterminate diastolic filling due to E-A fusion.  2. Right ventricular systolic function is low normal. The right  ventricular size is mildly enlarged. There is moderately elevated  pulmonary artery systolic pressure. The estimated right ventricular  systolic pressure is 45.2 mmHg.  3. Left atrial size was severely dilated.  4. Right atrial size was mild to moderately dilated.  5. The mitral valve is normal in structure. Moderate mitral valve  regurgitation.  6. The aortic valve is normal in structure. Aortic valve regurgitation is  not visualized.  7. The inferior vena cava is dilated in size with >50% respiratory  variability, suggesting right atrial pressure of 8 mmHg.   CATH: 12/29/2019  Prox LAD to Mid LAD lesion is 40% stenosed.  Ost Cx to Mid Cx lesion is 100% stenosed.  Prox RCA lesion is 90% stenosed.  LV end diastolic pressure is mildly elevated.  Hemodynamic findings consistent with mild pulmonary hypertension.   1. 2 vessel obstructive CAD    - 100% proximal LCX at site of prior stent. This was a large vessel. No collaterals.    - 90% small co dominant RCA 2. Mildly elevated LV filling pressures 3. Mild pulmonary HTN 4. Preserved cardiac output.     Plan: recommend medical management. The LCx is not salvageable. The RCA is quite small- only 2-2.25 mm diameter.   Recent Labs: 12/16/2019: ALT 49; TSH 1.311 01/10/2020: B Natriuretic Peptide 2,714.5 01/13/2020: Magnesium 1.9 01/28/2020: BUN 9; Creatinine, Ser 0.83; Hemoglobin 13.4; Platelets 176; Potassium 4.0; Sodium 145  CBC    Component Value Date/Time   WBC 6.0 01/28/2020 1442   WBC 6.9 01/13/2020 0341   RBC 3.89 (L) 01/28/2020 1442   RBC 3.77 (L) 01/13/2020 0341   HGB 13.4 01/28/2020 1442   HCT 39.4 01/28/2020 1442   PLT 176 01/28/2020 1442   MCV 101 (H) 01/28/2020 1442   MCH 34.4 (H) 01/28/2020 1442   MCH 34.0 01/13/2020 0341   MCHC 34.0 01/28/2020 1442   MCHC 32.1 01/13/2020 0341   RDW 12.7 01/28/2020 1442   LYMPHSABS 2.3 01/13/2020 0341   MONOABS 0.5 01/13/2020 0341   EOSABS 0.1 01/13/2020 0341   BASOSABS 0.1 01/13/2020 0341   CMP Latest Ref Rng & Units 01/28/2020 01/13/2020 01/12/2020  Glucose 65 - 99 mg/dL 97 818(H) 631(S)  BUN 8 - 27 mg/dL 9 9 10   Creatinine 0.76 - 1.27 mg/dL 9.70 2.63  Sodium 134 - 144 mmol/L 145(H) 139 141  Potassium 3.5 - 5.2 mmol/L 4.0 3.7 4.0  Chloride 96 - 106 mmol/L 105 101 100  CO2 20 - 29 mmol/L 28 29 30   Calcium 8.6 - 10.2 mg/dL 9.3 8.9 9.1  Total Protein 6.5 - 8.1 g/dL - - -  Total Bilirubin 0.3 - 1.2 mg/dL - - -  Alkaline Phos 38 - 126 U/L - - -  AST 15 - 41 U/L - - -  ALT 0 - 44 U/L - - -     Lipid Panel No results found for: CHOL, HDL, LDLCALC, LDLDIRECT, TRIG, CHOLHDL    Wt Readings from Last 3 Encounters:  02/29/20 148 lb 12.8 oz (67.5 kg)  01/28/20 148 lb (67.1 kg)  01/12/20 151 lb 10.8 oz (68.8 kg)     Other studies Reviewed:  Additional studies/ records that were reviewed today include: Office notes, hospital records and testing.  ASSESSMENT AND PLAN:  1.  Acute on chronic systolic CHF - although no change in his weight, he has definite volume overload - SBP was 90s, is improved - will increase Lasix to 60 mg  po bid x 3 days, then go back to 40 mg am, 20 mg pm - wife is encouraged to watch the sodium in foods - if erythema worsens, they are to contact MD for ABX for recurrent cellulitis - do not wish to be too aggressive w/ diuresis, not sure BP will tolerate. - f/u in 1 month, sooner prn - ck BMET today  2. LE blister - Hospice RN follows this - explained that the blister is coming from too much fluid - they are to keep an eye on this  3. HTN - has had problems w/ hypotension as well - Well-controlled on current rx - follow   4. AAA - no S&S of rupture - follow for sx  5. PAD - no sx from this - u/a to get pedal pulses due to edema   Current medicines are reviewed at length with the patient today.  The patient does not have concerns regarding medicines.  The following changes have been made:  Temporarily increase Lasix  Labs/ tests ordered today include:  No orders of the defined types were placed in this encounter.    Disposition:   FU with Elouise Munroe, MD  Signed, Rosaria Ferries, PA-C  02/29/2020 3:01 PM    [place] Phone: [phone]; Fax: [phone]

## 2020-02-29 NOTE — Patient Instructions (Signed)
Medication Instructions:   Increase Lasix (Furosemide) to 60 mg in the morning and 60 mg in the evening for 3 days. Then, go back to 40 mg in the morning and 20 mg in the evening.  *If you need a refill on your cardiac medications before your next appointment, please call your pharmacy*   Lab Work: Your physician recommends that you return for lab work today: BMET  If you have labs (blood work) drawn today and your tests are completely normal, you will receive your results only by: Marland Kitchen MyChart Message (if you have MyChart) OR . A paper copy in the mail If you have any lab test that is abnormal or we need to change your treatment, we will call you to review the results.   Follow-Up: At [facility], you and your health needs are our priority.  As part of our continuing mission to provide you with exceptional heart care, we have created designated Provider Care Teams.  These Care Teams include your primary Cardiologist (physician) and Advanced Practice Providers (APPs -  Physician Assistants and Nurse Practitioners) who all work together to provide you with the care you need, when you need it.  We recommend signing up for the patient portal called "MyChart".  Sign up information is provided on this After Visit Summary.  MyChart is used to connect with patients for Virtual Visits (Telemedicine).  Patients are able to view lab/test results, encounter notes, upcoming appointments, etc.  Non-urgent messages can be sent to your provider as well.   To learn more about what you can do with MyChart, go to [email or web address].    Your next appointment:   1 month(s)  The format for your next appointment:   In Person  Provider:   Weston Brass, MD

## 2020-03-01 LAB — BASIC METABOLIC PANEL
BUN/Creatinine Ratio: 7 — ABNORMAL LOW (ref 10–24)
BUN: 7 mg/dL — ABNORMAL LOW (ref 8–27)
CO2: 33 mmol/L — ABNORMAL HIGH (ref 20–29)
Calcium: 9.3 mg/dL (ref 8.6–10.2)
Chloride: 99 mmol/L (ref 96–106)
Creatinine, Ser: 1.01 mg/dL (ref 0.76–1.27)
GFR calc Af Amer: 87 mL/min/{1.73_m2} (ref >59)
GFR calc non Af Amer: 76 mL/min/{1.73_m2} (ref >59)
Glucose: 102 mg/dL — ABNORMAL HIGH (ref 65–99)
Potassium: 3.7 mmol/L (ref 3.5–5.2)
Sodium: 143 mmol/L (ref 134–144)

## 2020-03-08 ENCOUNTER — Encounter: Payer: Self-pay | Admitting: Physician Assistant

## 2020-03-10 NOTE — Addendum Note (Signed)
Addended by: Brunetta Genera on: 03/10/2020 11:12 AM   Modules accepted: Orders

## 2020-04-03 ENCOUNTER — Ambulatory Visit: Admitting: Internal Medicine

## 2020-04-10 ENCOUNTER — Encounter: Payer: Self-pay | Admitting: Internal Medicine

## 2021-11-06 IMAGING — CR DG CHEST 2V
2 series · 2 of 2 positions shown · non-contrast
Comparison: 12/19/2019

CLINICAL DATA: Shortness of breath

EXAM:
CHEST - 2 VIEW

[chest pa]
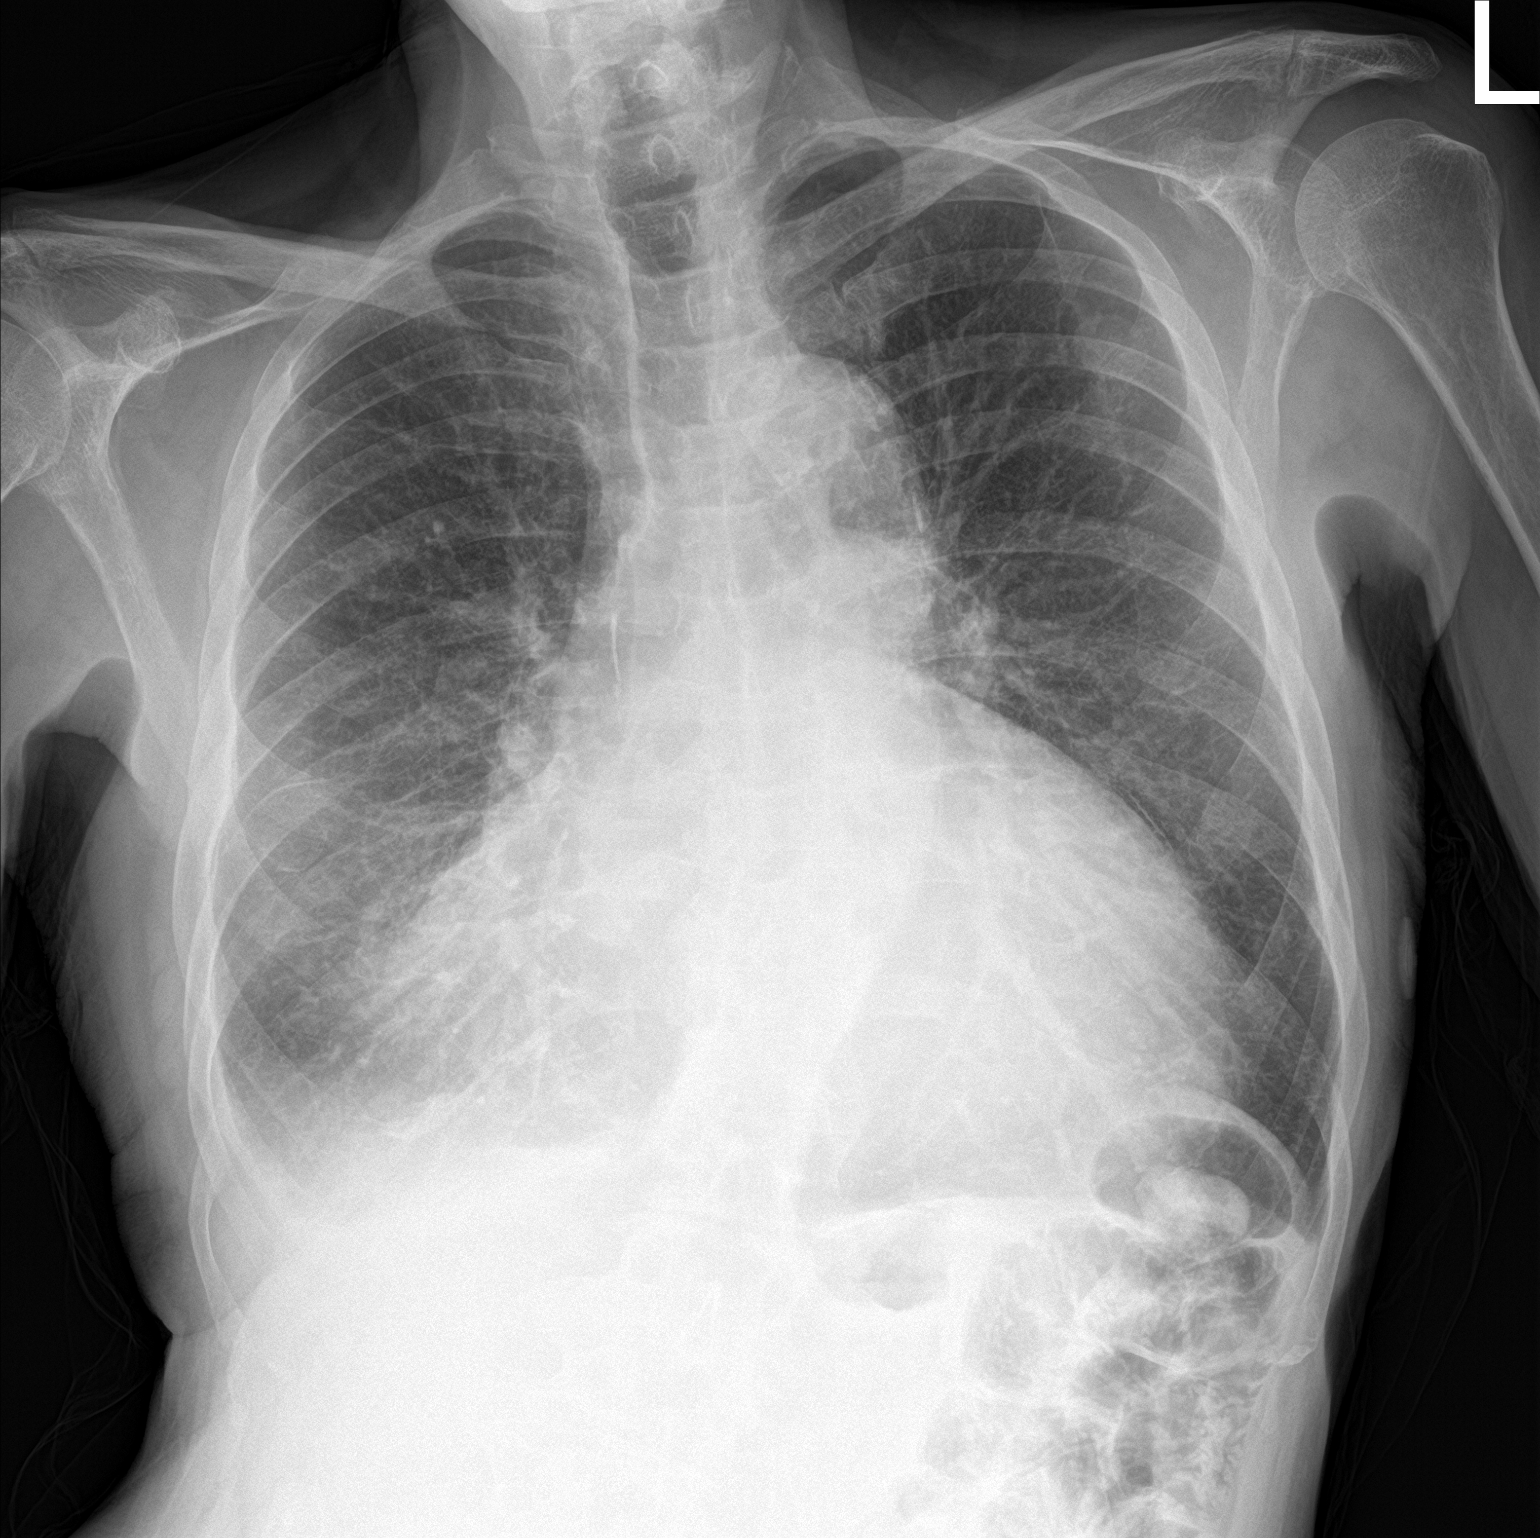

[chest lat]
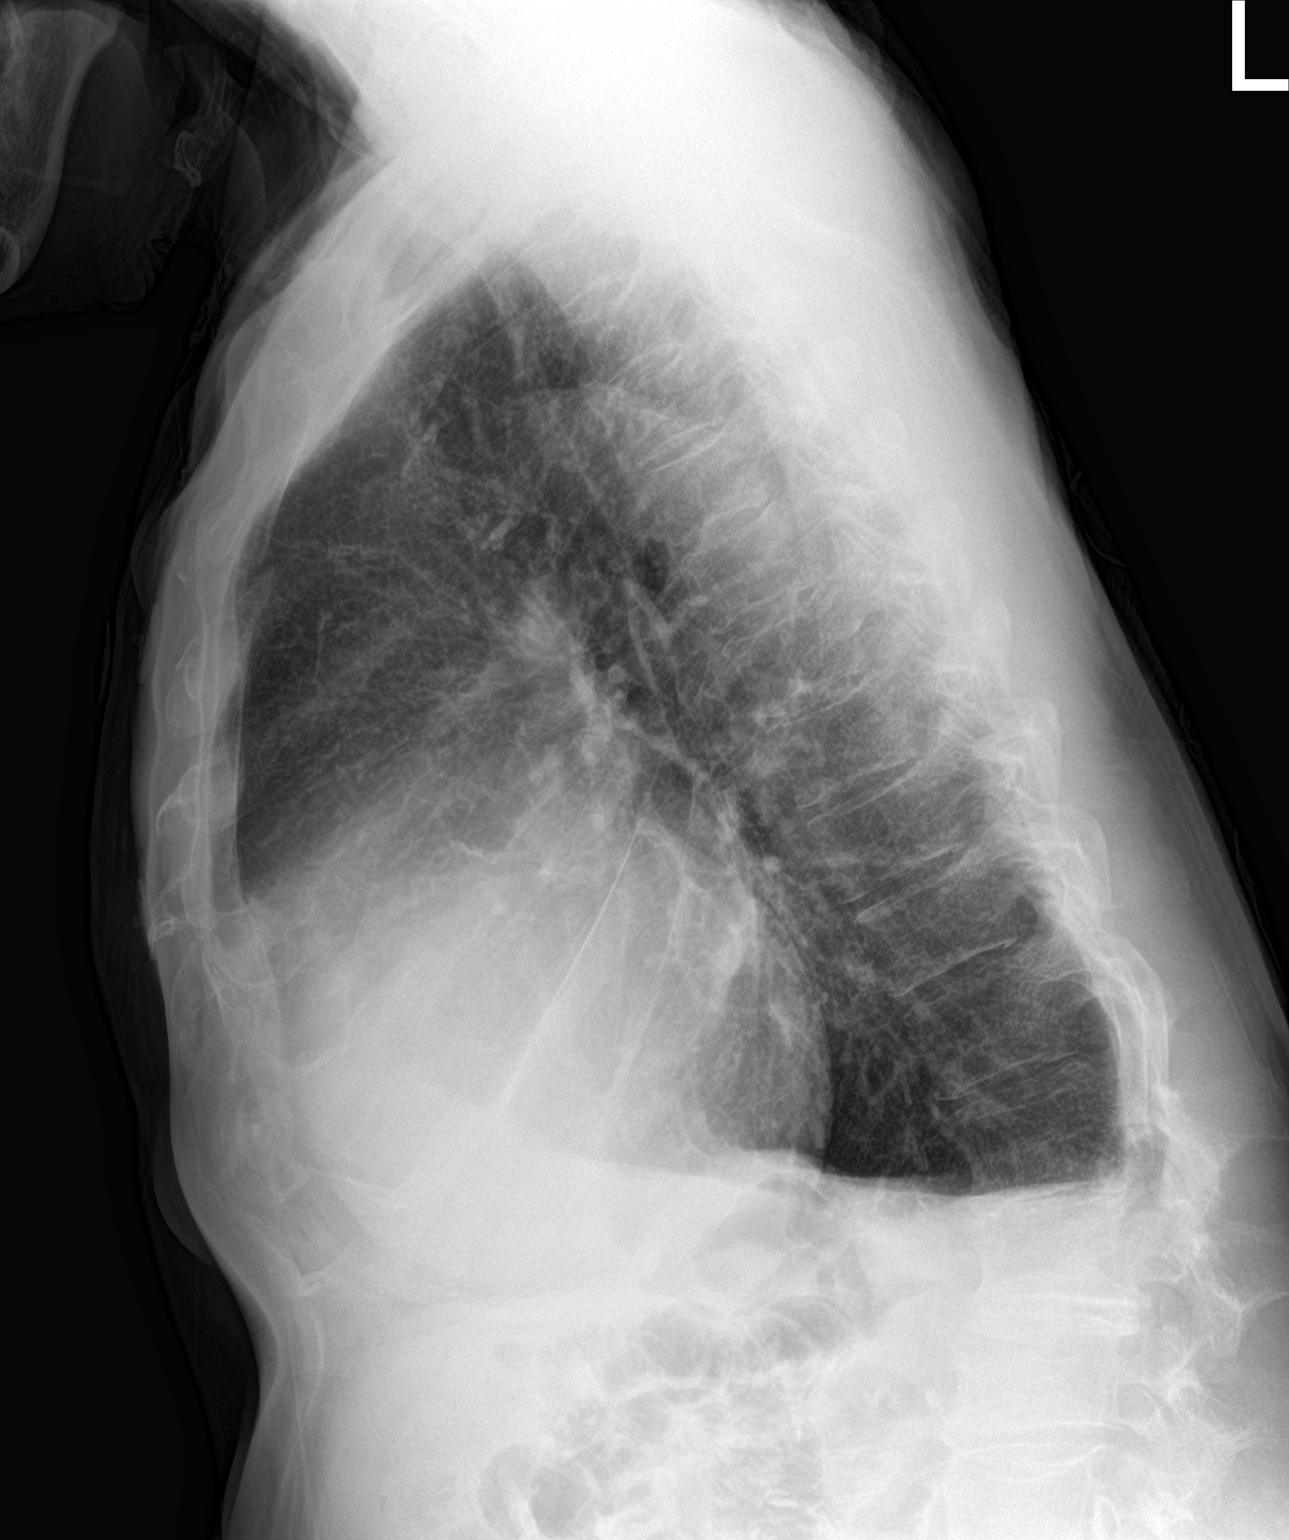

[2 of 2 positions shown; findings below may reference images not displayed]

FINDINGS: Cardiomegaly. There is hyperinflation of the lungs compatible with
COPD. Small right pleural effusion with right base atelectasis. No
confluent opacity on the left. No overt edema or acute bony
abnormality.
IMPRESSION: COPD, cardiomegaly.

Small right pleural effusion with right base atelectasis.
